# Patient Record
Sex: Female | Born: 1950 | Race: White | Hispanic: No | Smoking: Never smoker
Health system: Southern US, Community
[De-identification: ages and names within clinical notes are randomized; demographics above are authoritative.]

## PROBLEM LIST (undated history)

## (undated) DIAGNOSIS — G459 Transient cerebral ischemic attack, unspecified: Secondary | ICD-10-CM

## (undated) DIAGNOSIS — I10 Essential (primary) hypertension: Secondary | ICD-10-CM

## (undated) DIAGNOSIS — E7800 Pure hypercholesterolemia, unspecified: Secondary | ICD-10-CM

## (undated) DIAGNOSIS — E119 Type 2 diabetes mellitus without complications: Secondary | ICD-10-CM

## (undated) DIAGNOSIS — M810 Age-related osteoporosis without current pathological fracture: Secondary | ICD-10-CM

## (undated) DIAGNOSIS — I219 Acute myocardial infarction, unspecified: Secondary | ICD-10-CM

## (undated) DIAGNOSIS — F329 Major depressive disorder, single episode, unspecified: Secondary | ICD-10-CM

## (undated) DIAGNOSIS — E785 Hyperlipidemia, unspecified: Secondary | ICD-10-CM

## (undated) DIAGNOSIS — I679 Cerebrovascular disease, unspecified: Secondary | ICD-10-CM

## (undated) DIAGNOSIS — R4701 Aphasia: Secondary | ICD-10-CM

## (undated) DIAGNOSIS — F32A Depression, unspecified: Secondary | ICD-10-CM

## (undated) DIAGNOSIS — I2510 Atherosclerotic heart disease of native coronary artery without angina pectoris: Secondary | ICD-10-CM

## (undated) DIAGNOSIS — I639 Cerebral infarction, unspecified: Secondary | ICD-10-CM

## (undated) DIAGNOSIS — N390 Urinary tract infection, site not specified: Secondary | ICD-10-CM

## (undated) DIAGNOSIS — M1990 Unspecified osteoarthritis, unspecified site: Secondary | ICD-10-CM

## (undated) HISTORY — DX: Hyperlipidemia, unspecified: E78.5

## (undated) HISTORY — PX: ABDOMINAL HYSTERECTOMY: SHX81

## (undated) HISTORY — DX: Unspecified osteoarthritis, unspecified site: M19.90

## (undated) HISTORY — DX: Essential (primary) hypertension: I10

## (undated) HISTORY — DX: Depression, unspecified: F32.A

## (undated) HISTORY — PX: OTHER SURGICAL HISTORY: SHX169

## (undated) HISTORY — DX: Transient cerebral ischemic attack, unspecified: G45.9

## (undated) HISTORY — DX: Type 2 diabetes mellitus without complications: E11.9

## (undated) HISTORY — DX: Pure hypercholesterolemia, unspecified: E78.00

## (undated) HISTORY — DX: Acute myocardial infarction, unspecified: I21.9

## (undated) HISTORY — DX: Major depressive disorder, single episode, unspecified: F32.9

## (undated) HISTORY — DX: Cerebral infarction, unspecified: I63.9

## (undated) HISTORY — DX: Atherosclerotic heart disease of native coronary artery without angina pectoris: I25.10

## (undated) HISTORY — DX: Aphasia: R47.01

## (undated) HISTORY — DX: Urinary tract infection, site not specified: N39.0

## (undated) HISTORY — DX: Cerebrovascular disease, unspecified: I67.9

---

## 2007-01-04 ENCOUNTER — Inpatient Hospital Stay (HOSPITAL_COMMUNITY): Admission: EM | Admit: 2007-01-04 | Discharge: 2007-01-09 | Payer: Self-pay | Admitting: Emergency Medicine

## 2007-01-04 ENCOUNTER — Ambulatory Visit: Payer: Self-pay | Admitting: Cardiovascular Disease

## 2007-01-24 ENCOUNTER — Ambulatory Visit: Payer: Self-pay | Admitting: Cardiovascular Disease

## 2007-01-31 ENCOUNTER — Encounter (HOSPITAL_COMMUNITY): Admission: RE | Admit: 2007-01-31 | Discharge: 2007-05-01 | Payer: Self-pay | Admitting: Cardiovascular Disease

## 2007-02-21 ENCOUNTER — Ambulatory Visit: Payer: Self-pay | Admitting: Cardiovascular Disease

## 2007-04-11 ENCOUNTER — Ambulatory Visit: Payer: Self-pay | Admitting: Cardiovascular Disease

## 2007-04-12 ENCOUNTER — Ambulatory Visit: Payer: Self-pay

## 2007-05-02 ENCOUNTER — Encounter (HOSPITAL_COMMUNITY): Admission: RE | Admit: 2007-05-02 | Discharge: 2007-06-07 | Payer: Self-pay | Admitting: Cardiovascular Disease

## 2007-06-02 ENCOUNTER — Inpatient Hospital Stay (HOSPITAL_COMMUNITY): Admission: EM | Admit: 2007-06-02 | Discharge: 2007-06-04 | Payer: Self-pay | Admitting: Emergency Medicine

## 2007-06-02 ENCOUNTER — Ambulatory Visit: Payer: Self-pay | Admitting: Cardiovascular Disease

## 2007-06-03 ENCOUNTER — Ambulatory Visit: Payer: Self-pay | Admitting: Vascular Surgery

## 2007-06-03 ENCOUNTER — Encounter: Payer: Self-pay | Admitting: Cardiovascular Disease

## 2007-06-11 ENCOUNTER — Ambulatory Visit: Payer: Self-pay | Admitting: Internal Medicine

## 2007-06-19 ENCOUNTER — Ambulatory Visit (HOSPITAL_COMMUNITY): Admission: RE | Admit: 2007-06-19 | Discharge: 2007-06-19 | Payer: Self-pay | Admitting: Internal Medicine

## 2007-07-01 ENCOUNTER — Ambulatory Visit: Payer: Self-pay | Admitting: Vascular Surgery

## 2007-07-02 ENCOUNTER — Encounter: Admission: RE | Admit: 2007-07-02 | Discharge: 2007-09-30 | Payer: Self-pay | Admitting: Neurology

## 2007-07-08 ENCOUNTER — Ambulatory Visit: Payer: Self-pay | Admitting: Cardiovascular Disease

## 2007-09-13 ENCOUNTER — Ambulatory Visit: Payer: Self-pay | Admitting: Psychology

## 2007-09-30 ENCOUNTER — Encounter: Admission: RE | Admit: 2007-09-30 | Discharge: 2007-09-30 | Payer: Self-pay | Admitting: Psychology

## 2007-10-16 ENCOUNTER — Ambulatory Visit: Payer: Self-pay | Admitting: Cardiovascular Disease

## 2008-01-03 ENCOUNTER — Ambulatory Visit: Payer: Self-pay | Admitting: Vascular Surgery

## 2008-04-01 ENCOUNTER — Ambulatory Visit: Payer: Self-pay | Admitting: Cardiovascular Disease

## 2008-04-29 ENCOUNTER — Ambulatory Visit (HOSPITAL_COMMUNITY): Admission: RE | Admit: 2008-04-29 | Discharge: 2008-04-30 | Payer: Self-pay | Admitting: Surgery

## 2008-04-29 ENCOUNTER — Encounter (INDEPENDENT_AMBULATORY_CARE_PROVIDER_SITE_OTHER): Payer: Self-pay | Admitting: Surgery

## 2008-05-24 IMAGING — CR DG CHEST 2V
2 series · 2 of 2 positions shown · non-contrast
Comparison: 06/02/2007

CLINICAL DATA: Pre admit for hyperparathyroidism

CHEST - 2 VIEW

[view not recorded (1 of 2)]
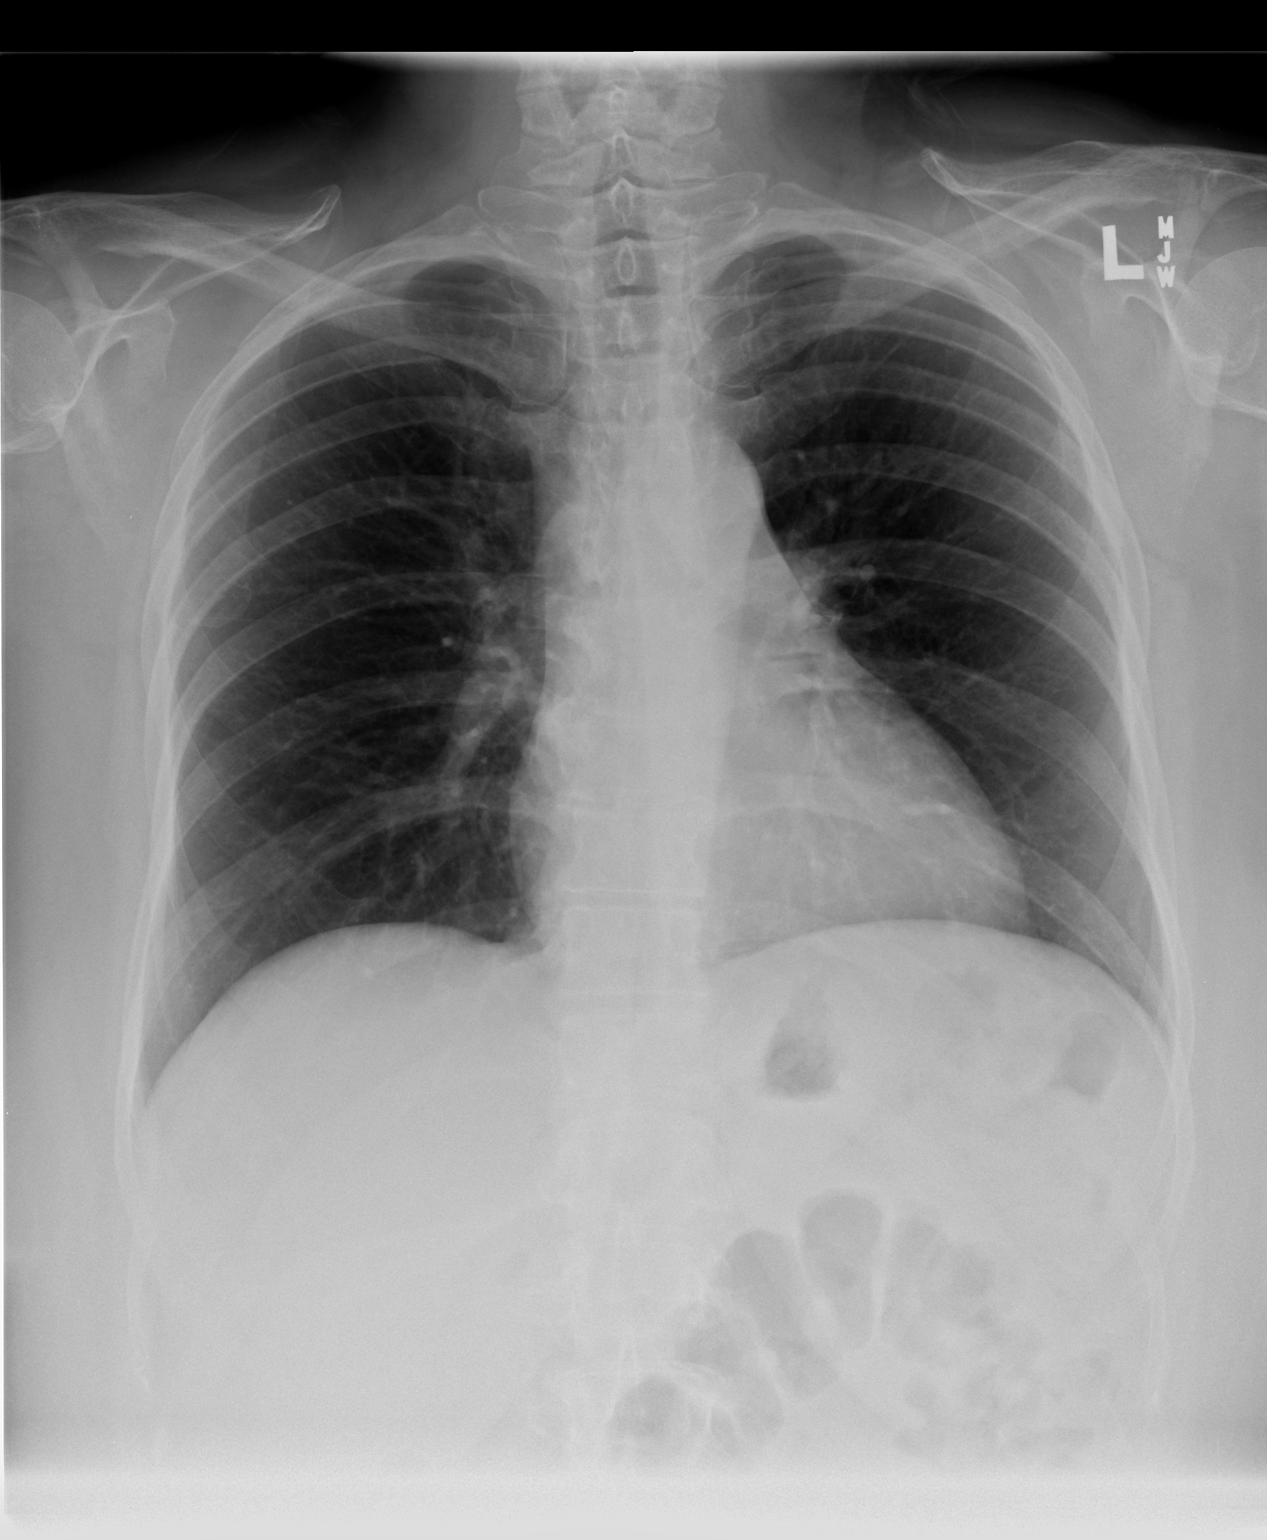

[view not recorded (2 of 2)]
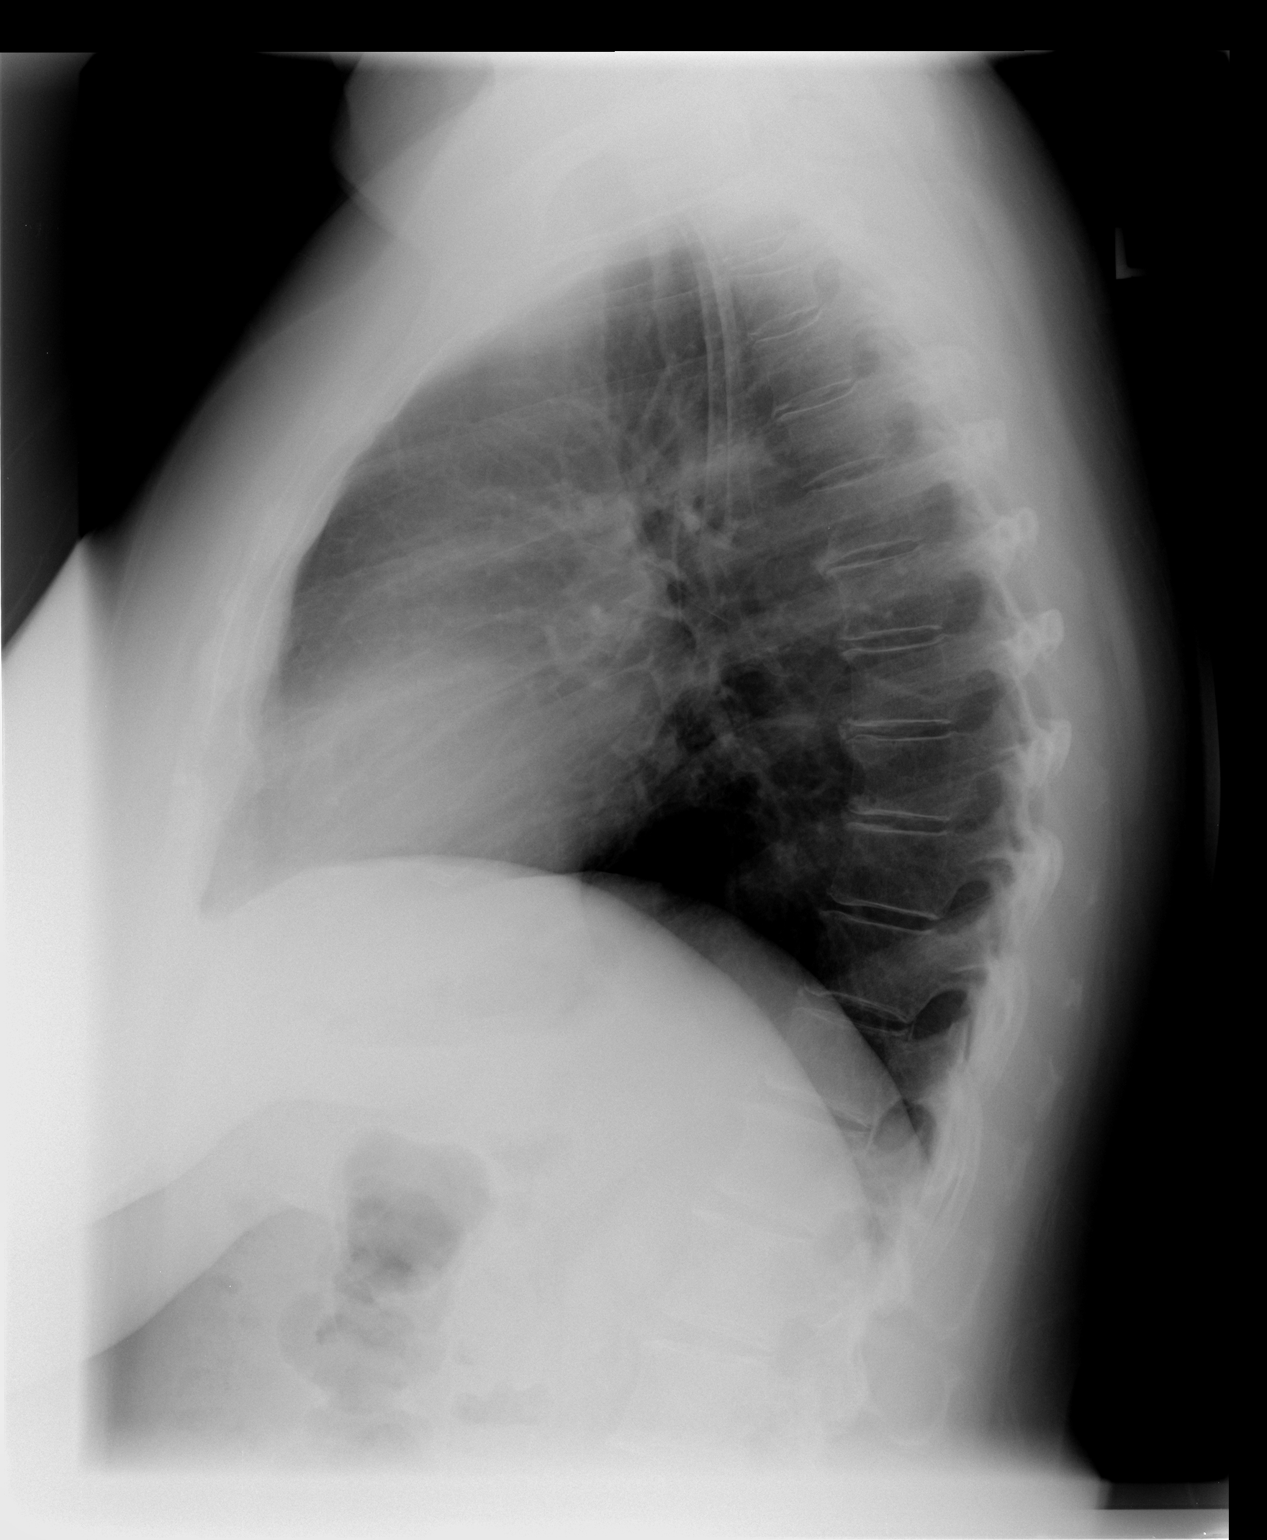

[2 of 2 positions shown; findings below may reference images not displayed]

FINDINGS: Heart and mediastinal contours normal.  Lungs clear.
Osseous structures intact.  No abnormality of the soft tissues.
IMPRESSION: No active disease

## 2008-09-25 ENCOUNTER — Ambulatory Visit: Payer: Self-pay | Admitting: Cardiovascular Disease

## 2009-03-30 DIAGNOSIS — N390 Urinary tract infection, site not specified: Secondary | ICD-10-CM

## 2009-03-30 DIAGNOSIS — I252 Old myocardial infarction: Secondary | ICD-10-CM | POA: Insufficient documentation

## 2009-03-30 DIAGNOSIS — Z862 Personal history of diseases of the blood and blood-forming organs and certain disorders involving the immune mechanism: Secondary | ICD-10-CM

## 2009-03-30 DIAGNOSIS — I219 Acute myocardial infarction, unspecified: Secondary | ICD-10-CM

## 2009-03-30 DIAGNOSIS — I2510 Atherosclerotic heart disease of native coronary artery without angina pectoris: Secondary | ICD-10-CM

## 2009-03-30 DIAGNOSIS — Z8639 Personal history of other endocrine, nutritional and metabolic disease: Secondary | ICD-10-CM

## 2009-03-30 DIAGNOSIS — I25119 Atherosclerotic heart disease of native coronary artery with unspecified angina pectoris: Secondary | ICD-10-CM | POA: Insufficient documentation

## 2009-03-30 DIAGNOSIS — M129 Arthropathy, unspecified: Secondary | ICD-10-CM | POA: Insufficient documentation

## 2009-03-30 DIAGNOSIS — E785 Hyperlipidemia, unspecified: Secondary | ICD-10-CM | POA: Insufficient documentation

## 2009-03-30 DIAGNOSIS — I6350 Cerebral infarction due to unspecified occlusion or stenosis of unspecified cerebral artery: Secondary | ICD-10-CM

## 2009-03-30 DIAGNOSIS — E7800 Pure hypercholesterolemia, unspecified: Secondary | ICD-10-CM

## 2009-03-30 DIAGNOSIS — E119 Type 2 diabetes mellitus without complications: Secondary | ICD-10-CM

## 2009-03-30 DIAGNOSIS — I10 Essential (primary) hypertension: Secondary | ICD-10-CM

## 2009-03-31 ENCOUNTER — Encounter: Payer: Self-pay | Admitting: Cardiovascular Disease

## 2009-03-31 ENCOUNTER — Ambulatory Visit: Payer: Self-pay | Admitting: Cardiovascular Disease

## 2009-04-09 ENCOUNTER — Ambulatory Visit: Payer: Self-pay

## 2009-06-22 ENCOUNTER — Telehealth: Payer: Self-pay | Admitting: Cardiovascular Disease

## 2009-10-12 ENCOUNTER — Encounter: Payer: Self-pay | Admitting: Cardiovascular Disease

## 2009-10-13 ENCOUNTER — Ambulatory Visit: Payer: Self-pay | Admitting: Cardiovascular Disease

## 2009-10-19 DIAGNOSIS — M810 Age-related osteoporosis without current pathological fracture: Secondary | ICD-10-CM | POA: Insufficient documentation

## 2009-10-19 DIAGNOSIS — I6529 Occlusion and stenosis of unspecified carotid artery: Secondary | ICD-10-CM | POA: Insufficient documentation

## 2009-10-19 DIAGNOSIS — E1139 Type 2 diabetes mellitus with other diabetic ophthalmic complication: Secondary | ICD-10-CM | POA: Insufficient documentation

## 2009-10-29 DIAGNOSIS — I69959 Hemiplegia and hemiparesis following unspecified cerebrovascular disease affecting unspecified side: Secondary | ICD-10-CM | POA: Insufficient documentation

## 2009-12-02 ENCOUNTER — Telehealth: Payer: Self-pay | Admitting: Cardiovascular Disease

## 2009-12-30 ENCOUNTER — Ambulatory Visit: Payer: Self-pay | Admitting: Vascular Surgery

## 2010-03-25 ENCOUNTER — Ambulatory Visit: Payer: Self-pay | Admitting: Cardiovascular Disease

## 2010-10-07 ENCOUNTER — Ambulatory Visit: Payer: Self-pay | Admitting: Cardiovascular Disease

## 2010-12-29 DIAGNOSIS — I252 Old myocardial infarction: Secondary | ICD-10-CM | POA: Insufficient documentation

## 2011-01-02 ENCOUNTER — Telehealth: Payer: Self-pay | Admitting: Cardiovascular Disease

## 2011-01-15 ENCOUNTER — Encounter: Payer: Self-pay | Admitting: Internal Medicine

## 2011-01-24 NOTE — Assessment & Plan Note (Signed)
Summary: per check /saf   CC:  no complaints.  History of Present Illness: Margaret Hobbs is seen today for F/U of elevated lipids, HTN and CAD she had her last cath in 05/2007 and had a patent stent to the RCA which was placed in January of 2008.  She has not had any SSCP.  Her BP has been under good controll.  She sees Dr. Clelia Croft for blood work in a couple of weeks.  She is on humera for psoriasis and would be considered immunocompromized.  She is to get a flue shot.  She needs a refill on her nitro. She is a bit depressed again this am.  She has gained some weight.  She has had a previous CVA with normal carotids and has a hard time with some LLE weakness.  Her grand-daughter has moved out and is lving on campus at [employer] nursing school.  Current Medications (verified): 1)  Plavix 75 Mg Tabs (Clopidogrel Bisulfate) .[name].. 1 Daily 2)  Crestor 10 Mg Tabs (Rosuvastatin Calcium) .[name].. 1 Daily 3)  Toprol Xl 100 Mg Xr24h-Tab (Metoprolol Succinate) .[name].. 1 Daily 4)  Hydralazine Hcl 50 Mg Tabs (Hydralazine Hcl) .[name].. 1 Tab By Mouth Two Times A Day 5)  Losartan Potassium-Hctz 100-25 Mg Tabs (Losartan Potassium-Hctz) .[name].. 1 Tab By Mouth Once Daily 6)  Glipizide-Metformin Hcl 5-500 Mg Tabs (Glipizide-Metformin Hcl) .[name].. 1 Tab By Mouth Two Times A Day 7)  Humira 40 Mg/0.12ml Kit (Adalimumab) .[name].. 1 Injection Every 2 Weeks 8)  Aspirin 81 Mg  Tabs (Aspirin) .[name].. 1 Tab By Mouth Once Daily 9)  Vitamin A .[name].. 1 Tab By Mouth Once Daily 10)  Calcium/ Vitamin D .... 1 Tab By Mouth Once Daily 11)  Vitamin D .... 1 Tab By Mouth Once Daily 12)  Folic Acid 1 Mg .[name].. 1 Tab By Mouth Once Daily 13)  Nitroglycerin 0.4 Mg Subl (Nitroglycerin) .[name].. 1 Tablet Under Tongue As Directed 14)  Vitamin B-12 1000 Mcg Tabs (Cyanocobalamin) .[name].. 1 Tab By Mouth Once Daily 15)  Vitamin C 500 Mg Tabs (Ascorbic Acid) .[name].. 1 Tab By Mouth Once Daily 16)  Vitamin E 400 Unit Chew (Vitamin E) .[name].. 1 Tab By Mouth Once Daily 17)  Co Q-10 30 Mg  Caps  (Coenzyme Q10) .[name].. 1 Tab By Mouth Once Daily  Allergies: No Known Drug Allergies  Past History:  Past Medical History: Last updated: 03/30/2009 Current Problems:  TIA (ICD-435.9) URINARY TRACT INFECTION (ICD-599.0) HYPERLIPIDEMIA (ICD-272.4) CEREBROVASCULAR DISEASE (ICD-437.9) APHASIA (ICD-784.3) DIABETES MELLITUS, TYPE II, MILD (ICD-250.00) CVA (ICD-434.91) HYPERPARATHYROIDISM, HX OF (ICD-V12.2) ARTHRITIS (ICD-716.90) HYPERTENSION (ICD-401.9) HYPERCHOLESTEROLEMIA (ICD-272.0) MYOCARDIAL INFARCTION (ICD-410.90) IMI 2008 CAD (ICD-414.00)  Past Surgical History: Last updated: 03/30/2009 hysterectomy  Mother alive with diabetes, hypertension.  Maternal   grandmother with diabetes.  Father deceased secondary to EtOH abuse.  No   siblings with known CAD.   Family History: Last updated: 03/30/2009  Mother alive with diabetes, hypertension.  Maternal   grandmother with diabetes.  Father deceased secondary to EtOH abuse.  No   siblings with known CAD.   Social History: Last updated: 03/31/2009 Widowed since 2009 Depressed Goes to [employer] work One son in [place]  Review of Systems       Denies fever, malais, weight loss, blurry vision, decreased visual acuity, cough, sputum, SOB, hemoptysis, pleuritic pain, palpitaitons, heartburn, abdominal pain, melena, lower extremity edema, claudication, or rash.   Vital Signs:  Patient profile:   60 year old female Height:  61 inches Weight:      177 pounds BMI:     33.56 Pulse rate:   68 / minute Resp:     14 per minute BP sitting:   120 / 70  (left arm)  Vitals Entered By: Kem Parkinson (March 25, 2010 8:57 AM)  Physical Exam  General:  Affect appropriate Healthy:  appears stated age HEENT: normal Neck supple with no adenopathy JVP normal no bruits no thyromegaly Lungs clear with no wheezing and good diaphragmatic motion Heart:  S1/S2 no murmur,rub, gallop or click PMI normal Abdomen:  benighn, BS positve, no tenderness, no AAA no bruit.  No HSM or HJR Distal pulses intact with no bruits No edema Neuro non-focal Skin warm and dry    Impression & Recommendations:  Problem # 1:  CAD (ICD-414.00)  No angina.  Continue medical Rx Her updated medication list for this problem includes:    Plavix 75 Mg Tabs (Clopidogrel bisulfate) .[name]... 1 daily    Toprol Xl 100 Mg Xr24h-tab (Metoprolol succinate) .[name]... 1 daily    Aspirin 81 Mg Tabs (Aspirin) .[name]... 1 tab by mouth once daily    Nitroglycerin 0.4 Mg Subl (Nitroglycerin) .[name]... 1 tablet under tongue as directed  Her updated medication list for this problem includes:    Plavix 75 Mg Tabs (Clopidogrel bisulfate) .[name]... 1 daily    Toprol Xl 100 Mg Xr24h-tab (Metoprolol succinate) .[name]... 1 daily    Aspirin 81 Mg Tabs (Aspirin) .[name]... 1 tab by mouth once daily    Nitroglycerin 0.4 Mg Subl (Nitroglycerin) .[name]... 1 tablet under tongue as directed  Problem # 2:  CVA (ICD-434.91) Continue antiplatlet Rx.  Has seen Dr Arbie Cookey in the past.  Carotids were normal in 2008 Her updated medication list for this problem includes:    Plavix 75 Mg Tabs (Clopidogrel bisulfate) .[name]... 1 daily    Aspirin 81 Mg Tabs (Aspirin) .[name]... 1 tab by mouth once daily  Problem # 3:  HYPERTENSION (ICD-401.9) Well  Her updated medication list for this problem includes:    Toprol Xl 100 Mg Xr24h-tab (Metoprolol succinate) .[name]... 1 daily    Hydralazine Hcl 50 Mg Tabs (Hydralazine hcl) .[name]... 1 tab by mouth two times a day    Losartan Potassium-hctz 100-25 Mg Tabs (Losartan potassium-hctz) .[name]... 1 tab by mouth once daily    Aspirin 81 Mg Tabs (Aspirin) .[name]... 1 tab by mouth once daily  Problem # 4:  HYPERLIPIDEMIA (ICD-272.4) Lipid and liver profile in 6 months Her updated medication list for this problem includes:    Crestor 10 Mg Tabs (Rosuvastatin calcium) .[name]... 1 daily  Patient Instructions: 1)  Your physician recommends that you schedule a follow-up  appointment in: 6 months   EKG Report  Procedure date:  03/25/2010  Findings:      NSR 68 Normal ECG

## 2011-01-24 NOTE — Assessment & Plan Note (Signed)
Summary: f51m/dm   History of Present Illness: Margaret Hobbs is seen today for F/U of elevated lipids, HTN and CAD she had her last cath in 05/2007 and had a patent stent to the RCA which was placed in January of 2008.  She has not had any SSCP.  Her BP has been under good controll.  She sees Dr. Clelia Croft for blood work in a couple of weeks.  She is on humera for psoriasis and would be considered immunocompromized.  She already got a flue shot.  She needs a refill on her hydralazine . She is a bit depressed again this am.  She has gained some weight.  She has had a previous CVA with normal carotids and has a hard time with some LLE weakness.  Her grand-daughter has moved out and is lving on campus at [employer] nursing school.  Current Problems (verified): 1)  Urinary Tract Infection  (ICD-599.0) 2)  Hyperlipidemia  (ICD-272.4) 3)  Diabetes Mellitus, Type II, Mild  (ICD-250.00) 4)  Cva  (ICD-434.91) 5)  Hyperparathyroidism, Hx of  (ICD-V12.2) 6)  Arthritis  (ICD-716.90) 7)  Hypertension  (ICD-401.9) 8)  Hypercholesterolemia  (ICD-272.0) 9)  Myocardial Infarction  (ICD-410.90) 10)  Cad  (ICD-414.00)  Current Medications (verified): 1)  Plavix 75 Mg Tabs (Clopidogrel Bisulfate) .[name].. 1 Daily 2)  Crestor 10 Mg Tabs (Rosuvastatin Calcium) .[name].. 1 Daily 3)  Toprol Xl 100 Mg Xr24h-Tab (Metoprolol Succinate) .[name].. 1 Daily 4)  Hydralazine Hcl 50 Mg Tabs (Hydralazine Hcl) .[name].. 1 Tab By Mouth Two Times A Day 5)  Losartan Potassium-Hctz 100-25 Mg Tabs (Losartan Potassium-Hctz) .[name].. 1 Tab By Mouth Once Daily 6)  Glipizide-Metformin Hcl 5-500 Mg Tabs (Glipizide-Metformin Hcl) .[name].. 1 Tab By Mouth Two Times A Day 7)  Humira 40 Mg/0.74ml Kit (Adalimumab) .[name].. 1 Injection Every 2 Weeks 8)  Aspirin 81 Mg  Tabs (Aspirin) .[name].. 1 Tab By Mouth Once Daily 9)  Vitamin A .[name].. 1 Tab By Mouth Once Daily 10)  Calcium/ Vitamin D .... 1 Tab By Mouth Once Daily 11)  Vitamin D .... 1 Tab By Mouth Once Daily 12)  Folic Acid 1 Mg .[name].. 1 Tab  By Mouth Once Daily 13)  Nitroglycerin 0.4 Mg Subl (Nitroglycerin) .[name].. 1 Tablet Under Tongue As Directed 14)  Vitamin B-12 1000 Mcg Tabs (Cyanocobalamin) .[name].. 1 Tab By Mouth Once Daily 15)  Vitamin C 500 Mg Tabs (Ascorbic Acid) .[name].. 1 Tab By Mouth Once Daily 16)  Vitamin E 400 Unit Chew (Vitamin E) .[name].. 1 Tab By Mouth Once Daily 17)  Co Q-10 30 Mg  Caps (Coenzyme Q10) .[name].. 1 Tab By Mouth Once Daily  Allergies (verified): No Known Drug Allergies  Past History:  Past Medical History: Last updated: 03/30/2009 Current Problems:  TIA (ICD-435.9) URINARY TRACT INFECTION (ICD-599.0) HYPERLIPIDEMIA (ICD-272.4) CEREBROVASCULAR DISEASE (ICD-437.9) APHASIA (ICD-784.3) DIABETES MELLITUS, TYPE II, MILD (ICD-250.00) CVA (ICD-434.91) HYPERPARATHYROIDISM, HX OF (ICD-V12.2) ARTHRITIS (ICD-716.90) HYPERTENSION (ICD-401.9) HYPERCHOLESTEROLEMIA (ICD-272.0) MYOCARDIAL INFARCTION (ICD-410.90) IMI 2008 CAD (ICD-414.00)  Past Surgical History: Last updated: 03/30/2009 hysterectomy  Mother alive with diabetes, hypertension.  Maternal   grandmother with diabetes.  Father deceased secondary to EtOH abuse.  No   siblings with known CAD.   Family History: Last updated: 03/30/2009  Mother alive with diabetes, hypertension.  Maternal   grandmother with diabetes.  Father deceased secondary to EtOH abuse.  No   siblings with known CAD.   Social History: Last updated: 03/31/2009 Widowed since 2009 Depressed Goes to [employer] work One  son in [place]  Review of Systems       Denies fever, malais, weight loss, blurry vision, decreased visual acuity, cough, sputum, SOB, hemoptysis, pleuritic pain, palpitaitons, heartburn, abdominal pain, melena, lower extremity edema, claudication, or rash.   Vital Signs:  Patient profile:   60 year old female Height:      61 inches Weight:      186 pounds BMI:     35.27 Pulse rate:   69 / minute Resp:     18 per minute BP sitting:   140 / 68   (left arm)  Vitals Entered By: Marrion Coy, CNA (October 07, 2010 8:53 AM)  Physical Exam  General:  Affect appropriate Healthy:  appears stated age HEENT: normal Neck supple with no adenopathy JVP normal no bruits no thyromegaly Lungs clear with no wheezing and good diaphragmatic motion Heart:  S1/S2 no murmur,rub, gallop or click PMI normal Abdomen: benighn, BS positve, no tenderness, no AAA no bruit.  No HSM or HJR Distal pulses intact with no bruits No edema Neuro non-focal Skin warm and dry    Impression & Recommendations:  Problem # 1:  HYPERLIPIDEMIA (ICD-272.4) Continue statin labs per Dr Clelia Croft Her updated medication list for this problem includes:    Crestor 10 Mg Tabs (Rosuvastatin calcium) .[name]... 1 daily  Problem # 2:  CVA (ICD-434.91) Stable with minimal residual effects.  Continue antiplatlets Her updated medication list for this problem includes:    Plavix 75 Mg Tabs (Clopidogrel bisulfate) .[name]... 1 daily    Aspirin 81 Mg Tabs (Aspirin) .[name]... 1 tab by mouth once daily  Problem # 3:  HYPERTENSION (ICD-401.9) Well controlled Her updated medication list for this problem includes:    Toprol Xl 100 Mg Xr24h-tab (Metoprolol succinate) .[name]... 1 daily    Hydralazine Hcl 50 Mg Tabs (Hydralazine hcl) .[name]... 1 tab by mouth two times a day    Losartan Potassium-hctz 100-25 Mg Tabs (Losartan potassium-hctz) .[name]... 1 tab by mouth once daily    Aspirin 81 Mg Tabs (Aspirin) .[name]... 1 tab by mouth once daily  Problem # 4:  MYOCARDIAL INFARCTION (ICD-410.90) CAD stable with no angina.  Continue antiplatelet and BB Her updated medication list for this problem includes:    Plavix 75 Mg Tabs (Clopidogrel bisulfate) .[name]... 1 daily    Toprol Xl 100 Mg Xr24h-tab (Metoprolol succinate) .[name]... 1 daily    Aspirin 81 Mg Tabs (Aspirin) .[name]... 1 tab by mouth once daily    Nitroglycerin 0.4 Mg Subl (Nitroglycerin) .[name]... 1 tablet under tongue as directed  Her updated medication list for this  problem includes:    Plavix 75 Mg Tabs (Clopidogrel bisulfate) .[name]... 1 daily    Toprol Xl 100 Mg Xr24h-tab (Metoprolol succinate) .[name]... 1 daily    Aspirin 81 Mg Tabs (Aspirin) .[name]... 1 tab by mouth once daily    Nitroglycerin 0.4 Mg Subl (Nitroglycerin) .[name]... 1 tablet under tongue as directed  Patient Instructions: 1)  Your physician recommends that you schedule a follow-up appointment in: 6 MONTHS WITH DR Eden Emms 2)  Your physician recommends that you continue on your current medications as directed. Please refer to the Current Medication list given to you today. Prescriptions: HYDRALAZINE HCL 50 MG TABS (HYDRALAZINE HCL) 1 tab by mouth two times a day  #180 x 3   Entered by:   Scherrie Bateman, LPN   Authorized by:   Colon Branch, MD, [facility]   Signed by:   Scherrie Bateman, LPN on 16/09/9603  Method used:   Faxed to ...       [facility] Environmental education officer)       [address] [address]       [place], [place]  [address]       Ph: [phone]       Fax: [phone]   RxID:   [id number]

## 2011-01-26 NOTE — Progress Notes (Signed)
Summary: refill  Phone Note Refill Request Message from:  Patient on January 02, 2011 4:16 PM  Refills Requested: Medication #1:  CRESTOR 10 MG TABS 1 daily  Medication #2:  LOSARTAN POTASSIUM-HCTZ 100-25 MG TABS 1 tab by mouth once daily [facility] [phone]  Initial call taken by: Judie Grieve,  January 02, 2011 4:18 PM    Prescriptions: LOSARTAN POTASSIUM-HCTZ 100-25 MG TABS (LOSARTAN POTASSIUM-HCTZ) 1 tab by mouth once daily  #90 x 3   Entered by:   Kem Parkinson   Authorized by:   Colon Branch, MD, [facility]   Signed by:   Kem Parkinson on 01/03/2011   Method used:   Faxed to ...       [facility] Environmental education officer)       [address] [address]       [place], [place]  [address]       Ph: [phone]       Fax: [phone]   RxID:   [id number] CRESTOR 10 MG TABS (ROSUVASTATIN CALCIUM) 1 daily  #90 x 3   Entered by:   Kem Parkinson   Authorized by:   Colon Branch, MD, [facility]   Signed by:   Kem Parkinson on 01/03/2011   Method used:   Faxed to ...       [facility] Environmental education officer)       [address] [address]       [place], [place]  [address]       Ph: [phone]       Fax: [phone]   RxID:   [id number]

## 2011-04-20 ENCOUNTER — Encounter: Payer: Self-pay | Admitting: Cardiovascular Disease

## 2011-04-21 ENCOUNTER — Other Ambulatory Visit: Payer: Self-pay | Admitting: *Deleted

## 2011-04-21 ENCOUNTER — Encounter: Payer: Self-pay | Admitting: Cardiovascular Disease

## 2011-04-21 ENCOUNTER — Ambulatory Visit (INDEPENDENT_AMBULATORY_CARE_PROVIDER_SITE_OTHER): Payer: Medicare Other | Admitting: Cardiovascular Disease

## 2011-04-21 DIAGNOSIS — I10 Essential (primary) hypertension: Secondary | ICD-10-CM

## 2011-04-21 DIAGNOSIS — I2510 Atherosclerotic heart disease of native coronary artery without angina pectoris: Secondary | ICD-10-CM

## 2011-04-21 DIAGNOSIS — E7800 Pure hypercholesterolemia, unspecified: Secondary | ICD-10-CM

## 2011-04-21 MED ORDER — METOPROLOL SUCCINATE ER 100 MG PO TB24: 100.0000 mg | ORAL_TABLET | Freq: Every day | ORAL | Status: DC

## 2011-04-21 MED ORDER — CLOPIDOGREL BISULFATE 75 MG PO TABS: 75.0000 mg | ORAL_TABLET | Freq: Every day | ORAL | Status: DC

## 2011-04-21 MED ORDER — NITROGLYCERIN 0.4 MG SL SUBL: 0.4000 mg | SUBLINGUAL_TABLET | SUBLINGUAL | Status: DC | PRN

## 2011-04-21 MED ORDER — LOSARTAN POTASSIUM-HCTZ 100-25 MG PO TABS: 1.0000 | ORAL_TABLET | Freq: Every day | ORAL | Status: DC

## 2011-04-21 MED ORDER — ROSUVASTATIN CALCIUM 10 MG PO TABS: 10.0000 mg | ORAL_TABLET | Freq: Every day | ORAL | Status: DC

## 2011-04-21 MED ORDER — HYDRALAZINE HCL 50 MG PO TABS: 50.0000 mg | ORAL_TABLET | Freq: Two times a day (BID) | ORAL | Status: DC

## 2011-04-21 NOTE — Assessment & Plan Note (Signed)
Stable with no angina and good activity level.  Continue medical Rx  

## 2011-04-21 NOTE — Assessment & Plan Note (Signed)
Cholesterol is at goal.  Continue current dose of statin and diet Rx.  No myalgias or side effects.  F/U  LFT's in 6 months. No results found for this basename: LDLCALC  Labs per Dr Clelia Croft

## 2011-04-21 NOTE — Assessment & Plan Note (Signed)
Well controlled.  Continue current medications and low sodium Dash type diet.    

## 2011-04-21 NOTE — Progress Notes (Signed)
Margaret Hobbs is seen today for F/U of elevated lipids, HTN and CAD she had her last cath in 05/2007 and had a patent stent to the RCA which was placed in January of 2008.  She has not had any SSCP.  Her BP has been under good controll.  She sees Dr. Clelia Croft for blood work in a couple of weeks.  She is on humera for psoriasis and would be considered immunocompromized.  She already got a flue shot.  She needs a refill on her hydralazine . She is a bit depressed again this am.  She has gained some weight.  She has had a previous CVA with normal carotids and has a hard time with some LLE weakness.  Her grand-daughter has moved out and is lving on campus at [employer] nursing school.  ROS: Denies fever, malais, weight loss, blurry vision, decreased visual acuity, cough, sputum, SOB, hemoptysis, pleuritic pain, palpitaitons, heartburn, abdominal pain, melena, lower extremity edema, claudication, or rash.   General: Affect appropriate Healthy:  appears stated age HEENT: normal Neck supple with no adenopathy JVP normal no bruits no thyromegaly Lungs clear with no wheezing and good diaphragmatic motion Heart:  S1/S2 no murmur,rub, gallop or click PMI normal Abdomen: benighn, BS positve, no tenderness, no AAA no bruit.  No HSM or HJR Distal pulses intact with no bruits No edema Neuro non-focal Skin warm and dry No muscular weakness   Current Outpatient Prescriptions  Medication Sig Dispense Refill  . adalimumab (HUMIRA) 40 MG/0.8ML injection Inject 40 mg into the skin every 14 (fourteen) days.        . Ascorbic Acid (VITAMIN C) 500 MG tablet Take 500 mg by mouth daily.        [name] aspirin 81 MG tablet Take 81 mg by mouth daily.        . calcium-vitamin D (OSCAL) 250-125 MG-UNIT per tablet Take 1 tablet by mouth daily.        . clopidogrel (PLAVIX) 75 MG tablet Take 75 mg by mouth daily.        [name] co-enzyme Q-10 30 MG capsule Take 30 mg by mouth daily.        . cyanocobalamin 1000 MCG tablet 1 tab po qd       .  folic acid (FOLVITE) 1 MG tablet Take 1 mg by mouth daily.        [name] glipiZIDE-metformin (METAGLIP) 5-500 MG per tablet Take 1 tablet by mouth 2 (two) times daily before a meal.        . hydrALAZINE (APRESOLINE) 50 MG tablet Take 50 mg by mouth 2 (two) times daily.        [name] losartan-hydrochlorothiazide (HYZAAR) 100-25 MG per tablet Take 1 tablet by mouth daily.        . metoprolol (TOPROL-XL) 100 MG 24 hr tablet Take 100 mg by mouth daily.        . nitroGLYCERIN (NITROSTAT) 0.4 MG SL tablet Place 0.4 mg under the tongue every 5 (five) minutes as needed.        . Risedronate Sodium (ATELVIA PO) 35 mg 1 tab per week       . rosuvastatin (CRESTOR) 10 MG tablet Take 10 mg by mouth daily.        . vitamin A 09811 UNIT capsule Take 10,000 Units by mouth daily.        [name] VITAMIN D, CHOLECALCIFEROL, PO 1 tab po qd      . vitamin E 400 UNIT capsule Take  400 Units by mouth daily.        [name] DISCONTD: vitamin B-12 (CYANOCOBALAMIN) 1000 MCG tablet Take 1,000 mcg by mouth daily.          Allergies  Review of patient's allergies indicates not on file.  Electrocardiogram:   NSR 77 occ PVC Poor R wave progression  Assessment and Plan

## 2011-04-21 NOTE — Patient Instructions (Signed)
Your physician recommends that you schedule a follow-up appointment in: 6 months with Dr. Nishan 

## 2011-05-09 NOTE — Discharge Summary (Signed)
Margaret Hobbs, Margaret Hobbs NO.:  [id number]   MEDICAL RECORD NO.:  [id number]          PATIENT TYPE:  INP   LOCATION:  3712                         FACILITY:  [facility]   PHYSICIAN:  [name], MD, FACCDATE OF BIRTH:  [birth year]   DATE OF ADMISSION:  06/02/2007  DATE OF DISCHARGE:  06/04/2007                               DISCHARGE SUMMARY   PROCEDURES:  1. Cardiac catheterization.  2. Coronary arteriogram.  3. Left ventriculogram.  4. StarClose to the right femoral artery.  5. Angiogram of the head and neck with contrast.   PRIMARY DIAGNOSIS:  Chest pain, medical therapy for coronary artery  disease recommended.   SECONDARY DIAGNOSES:  1. Diabetes.  2. Dyslipidemia.  3. History of diastolic dysfunction  4. Status post ST-elevation myocardial infarction in January 2008 with      percutaneous transluminal cardiac angioplasty and stent to the      right posterior descending artery.  5. Hypertension.  6. Rheumatoid arthritis.  7. Gastroesophageal reflux disease.  8. History of urinary tract infections.  9. Remote history of hysterectomy.  10.Possible transient ischemic attack (CT angiogram of the head and      neck pending).   Time of discharge:  39 minutes.   HOSPITAL COURSE:  Margaret Hobbs is a 60 year old female with known  coronary artery disease.  She was at cardiac rehab and was noted to have  slightly decreased level of consciousness.  Her CBG and oxygen levels  were okay.  She said she could not focus.  Her symptoms resolved, and  she had no further problems.  Later that day, she developed chest pain  that was initially intermittent but became constant.  It was relieved in  the emergency room with nitroglycerin.  She was admitted for further  evaluation.   She was noted to have a carotid bruit, and, because of her episode of  decreased level of consciousness, carotid Dopplers were performed.  She  had of 40-60% right ICA stenosis and a  60-80% left ICA stenosis.  There  was antegrade flow in both vertebral arteries.   Her cardiac enzymes were negative for MI, and a cardiac catheterization  was performed on June 03, 2007. The cardiac catheterization showed  improved left ventricular diastolic filling pressures, which were now  normalized, and a patent stent in the RCA-PDA.  She had diffuse coronary  artery disease including 80% OM, a 95% acute marginal, and a 70%  diagonal that was stable from a prior catheterization.  Medical therapy  was recommended for this.  Her EF was 60% with no wall motion  abnormalities.   On June 04, 2007, Margaret Hobbs was evaluated by Dr. Eden Emms.  Because of claustrophobia, she cannot tolerate the MRI, and, therefore,  CT angiogram of her head and neck was done to further evaluate the  carotid stenosis.  If those results do not require further inpatient  stay, she is tentatively considered stable for discharge on June 04, 2007, with outpatient followup arranged.   DISCHARGE INSTRUCTIONS:  1. Her activity level is to be increased gradually.  2. She is not to drive for 2  days and not to lift for a week.  3. She is to call our office for any problems with the catheterization      site.  4. She has appointment with Dr. Fabio Bering PA on June 23 at 3:30.  5. She is to follow up with Dr. Clelia Croft as needed.   DISCHARGE MEDICATIONS:  1. Metaglip 5/500 is to be restarted on June 07, 2007.  2. Methotrexate 2.5 mg tablets weekly.  3. Plavix 75 mg daily indefinitely.  4. Crestor 10 mg daily.  5. Hyzaar 100/25 mg daily.  6. Hydralazine 50 mg 3 times a day.  7. Glipizide 5 mg b.i.d. while off the Metaglip.  8. Nitroglycerin sublingual p.r.n.  9. Toprol XL 50 mg a day.      Theodore Demark, PA-C      [name], MD, [facility]  Electronically Signed    RB/MEDQ  D:  06/04/2007  T:  06/04/2007  Job:  [id number]   cc:   Martha Clan, MD, [facility]

## 2011-05-09 NOTE — Assessment & Plan Note (Signed)
[facility] HEALTHCARE                            CARDIOLOGY OFFICE NOTE   HADLEE, BURBACK                 MRN:          [id number]  DATE:10/16/2007                            DOB:          11/21/1951    [name] returns today for follow up. She has a history of coronary  artery disease with diabetes and hypertension. She has had stenting of  the right coronary artery and has some residual left sided disease.   She is not having any significant chest pain. Her last Myoview on  04/12/2007 was low risk and we felt that continued medical therapy was  warranted. She has good LV function.   Unfortunately, she suffered a recent CVA and was seen by Dr. Orlin Hilding.  She has a history of a 60% to 80% left carotid stenosis which has been  followed by Dr. Arbie Cookey. The velocities tend to place the stenosis in the  low end of her range and she has not been thought to need surgery. This  is being followed closely by Dr. Arbie Cookey. I believe that her last  ultrasound was done in April. She has continued on aspirin and Plavix  for this. She thinks that her sugars have been under reasonable control.  I did review blood work from Dr. Alver Fisher office and her LDL cholesterol  was 70. Her hemoglobin a1C was 5.3.   CURRENT MEDICATIONS:  1. Plavix 75 daily.  2. Glipizide/Metformin 5/500 1 tablet b.i.d.  3. Methotrexate.  4. Toprol 100 daily.  5. Humira 40 mg injection once every 3 weeks.  6. Hydralazine 50 t.i.d.  7. Hyzaar 100/25.  8. Aspirin daily.   PHYSICAL EXAMINATION:  GENERAL:  Remarkable for a somewhat depressed  white female in no distress. She has a little bit of difficulty with her  speech since her CVA, but there is no other focal neurological defects.  VITAL SIGNS:  Weight 169, blood pressure 128/78, pulse 84 and regular,  afebrile, respiratory rate 14.  HEENT:  Normal.  NECK:  Supple. There are no bruits. No lymphadenopathy. No thyromegaly.  No JVP elevation.  I listened closely particularly on the left side and  clearly there was no bruit.  LUNGS:  Clear with good diaphragmatic motion and no wheezing.  CARDIAC:  S1, S2 with distant heart sounds. PMI is not palpable.  ABDOMEN:  Bowel sounds positive. No tenderness. No AAA. No  hepatosplenomegaly or hepato jugular reflux.  EXTREMITIES:  Distal pulses intact. No edema.  SKIN:  Warm and dry.  MUSCULOSKELETAL:  There is no muscular weakness.   IMPRESSION:  1. Coronary artery disease with previous stenting of the right      coronary artery, continue aspirin and Plavix. Follow up Myoview in      a year.  2. History of CVA to be re-evaluated by Dr. Orlin Hilding and Dr. Arbie Cookey. He      may end up doing an elective left CEA for her ulcerated plaque. My      understanding is that he wanted to put off surgery since she did      have a recent CVA. Again, she will  continue antiplatelet therapy as      indicated.  3. Hypertension, currently well controlled. Continue current dosages      of hydralazine and Hyzaar.  4. Diabetes, well controlled. Continue glipizide/metformin. Hemoglobin      A1C less than 6. Continue diet modification.  5. Hypercholesterolemia in the setting of coronary disease. I will      have to look at her medication list. As far as I can tell, she does      not appear to be on a statin drug. Since she has diabetes and      coronary disease, I would probably recommend this even if her LDL      cholesterol is only 70.   Overall, I think that Moon is doing better. She still seems somewhat  depressed about the rehab needed for her CVA. She has a pleasant trip  planned to [place] and [place] for a couple of weddings and  hopefully this will make her smile a little bit more.     [name], MD, [facility]  Electronically Signed    PCN/MedQ  DD: 10/16/2007  DT: 10/17/2007  Job #: [id number]

## 2011-05-09 NOTE — Assessment & Plan Note (Signed)
[facility] HEALTHCARE                            CARDIOLOGY OFFICE NOTE   Margaret Hobbs, Margaret Hobbs                 MRN:          [id number]  DATE:06/11/2007                            DOB:          25-Nov-1951    PRIMARY CARDIOLOGIST:  Dr. Charlton Haws.   PRIMARY CARE PHYSICIAN:  Dr. Clelia Croft.   RHEUMATOLOGIST:  DR. Corliss Skains.   PATIENT PROFILE:  A 60 year old, Caucasian female with prior history of  CAD who presents for hospital followup.   PROBLEM LIST:  1. CAD.      a.     January 06, 2007, PCI and stenting of the PDA with placement       of a 2.5 x 24 mm express bare metal stent.      b.     April 12, 2007, exercise Myoview, exercise time 6 minutes       and 30 seconds. Maximum heart rate 121 beats per minute achieving       7.5 mets. EF was 75% with perfusion findings consistent with scar       and mild periinfarct ischemia. She was medically managed at that       time.      c.     June 03, 2007, cardiac catheterization revealing patent stent       in the RCA and PDA. She had diffuse coronary disease including an       80% in the OM, a 95% in the acute marginal, 97% stenosis in the       diagonal which was stable from prior catheterization. EF was 60%       without regional wall motion abnormalities.  2. Right lower extremity weakness.  3. Intermittent expressive aphasia.  4. Cerebrovascular disease.      a.     June 04, 2007, CT angio of the head and neck revealing no       significant carotid stenosis with small vetebral arteries       bilaterally with disease in the distal right vertebral artery but       both vertebral arteries were patent to the basilar. There was mild       stenosis in the left A1 segment as well as the anterior branch of       the right middle cerebral artery.  5. Hypertension.  6. Hyperlipidemia.  7. Type 2 diabetes mellitus.  8. Rheumatoid arthritis.  9. GERD.  10.History of UTI.  11.Remote history of hysterectomy.  12.Questionable TIA.   HISTORY OF PRESENT ILLNESS:  A 60 year old Caucasian female with the  above problem list who was admitted to [facility] from June 8 to June 10  after developing a decreased level of consciousness, expressive aphasia  and right lower extremity weakness while at cardiac rehab. She also had  some chest pain which is why she was admitted and underwent left heart  cardiac catheterization on June 9 revealing patent stents and otherwise  diffuse medically manageable disease. She did have some neurologic  symptoms. Initially an MRI was ordered; however, she could not tolerate  this secondary to claustrophobia and a CT  angio of the head and neck was  performed revealing no significant stenoses as outlined in the problem  list. She was discharged home on the 10th. Since her discharge, she has  not had any recurrent chest pain or shortness of breath; however, she  has continued to have intermittent expressive aphasia and slurring of  her speech and also right lower extremity weakness which has limited her  ability to ambulate. Her husband brought her in today with a wheelchair.  She denies any numbness or paresthesias and has been eating well. She  denies any dysphagia, vision or hearing changes. She denies any changes  in her cognitive abilities. She notes that she has had similar weakness  in her right foot when off of her rheumatologic medications for a  prolonged period of time although she has been taking these regularly.  She took her Humira injection this past Saturday and was expecting to  see some improvement in this weakness, however, she has not. She has not  seen primary care nor has she seen neurology despite her symptoms.   HOME MEDICATIONS:  1. Aspirin 81 mg daily.  2. Plavix 75 mg daily.  3. Glipizide/metformin 5/500 b.i.d.  4. Methotrexate 2.5 mg 6 tablets every other Tuesday.  5. Toprol XL 100 mg daily.  6. Humira 40 mg injection once q.3 weeks.  7.  Hydralazine 50 mg t.i.d.  8. Hyzaar HCT 100/25 mg daily.   PHYSICAL EXAMINATION:  VITAL SIGNS:  Blood pressure is 146/88, heart  rate 70, respirations 16.  GENERAL:  Pleasant, white female in no acute distress. Awake, alert and  oriented x3.  NECK:  With a soft, left carotid bruit. There is no JVD.  LUNGS:  Respirations regular and labored. Clear to auscultation.  CARDIAC:  Regular S1, S2. Normal S3, S4, murmurs.  ABDOMEN:  Obese, soft, nontender, nondistended. Bowel sounds present x4.  EXTREMITIES:  Warm, dry, pink. No clubbing, cyanosis or edema. Dorsalis  pedis and posterior tibial pulses 2+ and equal bilaterally. The right  groin site is without bleeding, bruising or hematoma.  NEUROLOGIC:  Cranial nerves II-XII are grossly intact. There is no  evidence of facial droop. Bilateral upper extremity strength is 5/5.  Left lower extremity is 5/5 and right lower extremity is at best 3/5.  She has equal sensation and proprioception throughout.   ACCESSORY CLINICAL FINDINGS:  None.   ASSESSMENT/PLAN:  1. Right lower extremity weakness with slurring of speech and      intermittent aphasia. The patient has been evaluated with a CT      angio of the head and neck while hospitalized which did not show      any acute problems or significant stenoses. She has not seen      neurology and will take the liberty of referring her to neurology      for further evaluation. I have discussed this with Dr. Jens Som and      he was in agreement with that plan.  2. Coronary artery disease. She has not had any chest pain or      shortness of breath. She has stable coronary disease from her last      catheterization. She remains on aspirin, Plavix, beta blocker and      ARB. She is apparently intolerant of statins.  3. Hyperlipidemia. As above intolerant to statins and ARB.  4. Type 2 diabetes mellitus followed by primary care. She remains on      Glipizide and metformin.  5. Rheumatoid arthritis  followed by her rheumatologist. We have      recommended followup as her current weakness symptoms are similar      to what she has experienced in the past with flares of rheumatoid      arthritis.  6. Disposition. Will try and arrange for neurology evaluation as soon      as possible. We recommended she followup with primary care and      rheumatology as well. Will see her back in 3 months or sooner if      necessary.      Nicolasa Ducking, ANP  Electronically Signed      Madolyn Frieze. Jens Som, MD, [facility]  Electronically Signed   CB/MedQ  DD: 06/11/2007  DT: 06/11/2007  Job #: [id number]

## 2011-05-09 NOTE — Assessment & Plan Note (Signed)
OFFICE VISIT   Margaret Hobbs, Margaret Hobbs  DOB:  14-Jul-1951                                       01/03/2008  ZOXWR#:[id number]   The patient had a lab-only study today.  I did review this with her.  She had mild carotid disease bilaterally and I would recommend for  vascular lab followup.  She does not need to see me at that time and  would repeat 2-year followups to rule out progression.   Larina Earthly, M.D.  Electronically Signed   TFE/MEDQ  D:  01/03/2008  T:  01/06/2008  Job:  [id number]

## 2011-05-09 NOTE — Consult Note (Signed)
NEW PATIENT CONSULTATION   Margaret Hobbs, Margaret Hobbs  DOB:  [birth year]                                       07/01/2007  NFAOZ#:[id number]   HISTORY:  Margaret Hobbs was in today for evaluation of  extracerebral vascular occlusive disease.  She is a 60 year old white  female who apparently had neurologic deficit around the time of a recent  cardiac catheterization.  She was admitted to [facility] June 02, 2007-June 04, 2007.  She had had a prior myocardial infarction with  PTCA and stenting in January of 2008.  This was a follow-up study after  she had had recurrent chest pain.  She reportedly had neurologic deficit  immediately following the cardiac catheterization.  I am seeing her fur  further evaluation.  She did undergo carotid duplex at the time of that  admission and I have this for review.  The velocities support moderate  level of stenoses bilaterally in the right and left carotid system.  She  also underwent CTA angiogram of her neck during that admission and I  have this for review and have discussed this with the patient and her  family present as well.  She denied any prior neurologic deficits.   PAST MEDICAL HISTORY:  1. Significant for diabetes.  She is non-insulin dependent.  2. She has elevated cholesterol.  3. Elevated blood pressure.  4. Prior myocardial infarction as stated.   HABITS:  She does not smoke or drink alcohol.   MEDICATIONS:  Plavix and aspirin.   PHYSICAL EXAMINATION:  GENERAL:  She is a well-developed, well-nourished  white female, previously stated age 52.  She does have continuing  ongoing deficit in her right hand, arm, and right leg.  NECK:  Her carotid arteries are without bruits bilaterally.  EXTREMITIES:  She has 2+ radial pulses.   I reviewed her CT scan with her.  This shows no evidence of significant  carotid stenoses bilaterally.  The angio of the head showed mild  stenosis in the intracranial  vessels.  There was no aneurysm present.  I  discussed this at length with Ms. Heider and her family present.  I have recommended that we repeat her duplex in six months to rule out  any progression.  We currently do not see any evidence of carotid  disease causing the left brain neurologic deficit that she has present.  She will continue to follow up with physical therapy and see me again in  six months.   Larina Earthly, M.D.  Electronically Signed   TFE/MEDQ  D:  07/01/2007  T:  07/01/2007  Job:  [id number]   cc:   Martha Clan, M.D.  [name], MD, [facility]  Catherine A. Orlin Hilding, M.D.

## 2011-05-09 NOTE — Assessment & Plan Note (Signed)
[facility] HEALTHCARE                            CARDIOLOGY OFFICE NOTE   Margaret, Hobbs                 MRN:          [id number]  DATE:09/25/2008                            DOB:          May 02, 1951    Margaret Hobbs returns today for followup.  She continues to be very depressed.  She lost her husband about a year ago and has really not recovered from  this.  Every time I see her in obvious that she continues to have major  depression.  She has only one son who lives in [place].  He is  trying to raise 4 kids on a waiter's job at [employer]	[place] and does not really  get to see his mother very often   In regards to her coronary artery disease, she has had ST elevation  myocardial infarction in January 2008.  She has had angioplasty and  stenting of the distal right coronary artery and the right PDA.  She has  diffuse but noncritical left-sided disease.  She is not having any  significant chest pain.  Her last Myoview study done on April 12, 2007,  was low-risk with small inferolateral wall infarct at mid and basal  level with mild peri-infarct ischemia.   Her LV function is preserved.  She had been compliant with her meds.  She is not having active angina.   CURRENT MEDICATIONS:  She is currently taking:  1. Plavix 75 a day.  2. Toprol 100 a day.  3. Hyzaar  100/25.  4. Crestor 10 a day.  5. Nitro as needed.  6. hydralazine 50 t.i.d.   PHYSICAL EXAMINATION:  GENERAL:  Her exam is remarkable for a depressed  white female who is prone to crying.  VITAL SIGNS:  Her weight is 171 which is stable.  Blood pressure is  110/67, pulse 78 and regular, respiratory rate 14, afebrile.  HEENT:  Unremarkable.  NECK:  Carotids are normal without bruit.  No lymphadenopathy,  thyromegaly, or JVP elevation.  LUNGS:  Clear with good diaphragmatic motion.  No wheezing.  HEART:  S1 and S2 with normal heart sounds.  PMI normal.  ABDOMEN:  Benign.  Bowel sounds positive.   No AAA.  No tenderness.  No  bruit.  No hepatosplenomegaly or hepatojugular reflux.  No tenderness.  EXTREMITIES:  Distal pulses are intact.  No edema.  NEURO:  Nonfocal.  SKIN:  Warm and dry.  MUSCULOSKELETAL:  No muscular weakness.   Electrocardiogram shows sinus rhythm with poor R-wave progression, left  axis deviation and an old inferior MI.   IMPRESSION:  1. Coronary artery disease.  Stent to the distal right and posterior      descending artery, currently stable.  Continue aspirin and Plavix.      Consider followup Myoview in a year.  2. Hypercholesteremia in the setting of coronary artery disease.      Continue Crestor.  Lipid and liver profile in 6 months.  3. Hypertension, currently well controlled.  Continue Hyzaar,      hydralazine and low-sodium diet.  4. Diabetes.  Follow up primary care MD.  Continue glipizide and  metformin 5/500.  5. Previous history of arthritis.  Continue methotrexate and Humira.   I think her heart is currently stable.  I will see her back in 6 months.     Margaret Hobbs. Margaret Emms, MD, [facility]  Electronically Signed    PCN/MedQ  DD: 09/25/2008  DT: 09/25/2008  Job #: [id number]

## 2011-05-09 NOTE — H&P (Signed)
[name], [name] NO.:  [id number]   MEDICAL RECORD NO.:  [id number]          PATIENT TYPE:  INP   LOCATION:  3712                         FACILITY:  [facility]   PHYSICIAN:  [name], MD, FACCDATE OF BIRTH:  [birth year]   DATE OF ADMISSION:  06/02/2007  DATE OF DISCHARGE:                              HISTORY & PHYSICAL   PRIMARY CARDIOLOGIST:  Theron Arista C. Eden Emms, MD, [facility]   PRIMARY CARE PHYSICIAN:  Dr.  Clelia Croft.   Margaret Hobbs is a patient well known to Dr. Eden Emms with known  coronary artery disease. Most recent cardiac workup in January of this  year. She had an acute MI, underwent cardiac catheterization with a bare  metal stent to the PDA. She has known three-vessel disease, also some  diastolic dysfunction with an EF of 55% by catheterization.  Ms. Margaret Rabon-  Jean Hobbs has a somewhat sedentary lifestyle secondary to her generalized  arthritis requiring methotrexate; however, she has been able to cardiac  rehab and just completed that this past Friday.  Interestingly  enough,while she was at rehab on Friday, had just completed her  exercises, had gotten off the stair climber, I believe she told me, and  had gone to report in exactly the rate and how fast she has walked, when  she got to the nurse, she could not verbally express the number that she  had walked. She knew in her mind what she had walked, but she could not  get to word out.  Apparently her CBG was checked which was okay per  patient's report.  Her oxygen level was okay.  Neurologically she  checked out okay.  The patient states she does have trouble focusing.  She went home, took a nap, felt fine, had no further problems.  Friday  night, though, she began to have some chest discomfort.  She described  it as a very localized ache. It felt very distant. It was a dull jabbing  sensation around her left breast. She initially states it was rather  steady but could not give me a length of time.  It is  very difficult to  obtain information from her. She is a poor historian.  She states then  it became intermittent, but then this morning it was steady and tense  sensation again. Apparently received nitroglycerin and aspirin here that  relieved her discomfort.  She states she is pain free now.  She denies  any associated symptoms with her chest discomfort.  No shortness of  breath, nausea, vomiting, diaphoresis.  It is not associated with any  position, activity or change.  Denies any belching, GERD.  No dizziness.  States her CBGs have run 80-100 at home.  She has not experienced any  other discomfort like this recently since January and has been doing  okay in rehab without any type of discomfort.   ALLERGIES:  Include SULFA and REMICADE.   MEDICATIONS AT HOME:  1. Plavix 75 mg daily.  2. Aspirin 81.  3. Toprol XL 50.  4. Hyzaar 100/25 daily.  5. Crestor 10.  6. Humira 40 mg injection every other week.  7.  Methotrexate 2.5 mg: The patient takes 6 tablets on Tuesdays only.  8. Glipizide/metformin 5/500. The patient takes 2 tablets p.o. b.i.d.  9. Hydralazine 50 mg p.o. 3 times a day.   PAST MEDICAL HISTORY:  1. Coronary artery disease status post MI in January with placement of      a bare metal stent to the PDA with known three-vessel disease.      diastolic dysfunction with EF 55.  2. Dyslipidemia.  3. Diabetes.  4. Arthritis requiring methotrexate.  5. Hypertension.  6. Hysterectomy.   SOCIAL HISTORY:  She lives in [place] with her husband.  She has one  adult child.  Denies any tobacco, EtOH or herbal medication use.  Exercise:  She has been doing cardiac rehab. Tries to comply to heart  healthy ADA diet.   FAMILY HISTORY:  Mother alive with diabetes, hypertension.  Maternal  grandmother with diabetes.  Father deceased secondary to EtOH abuse.  No  siblings with known CAD.   REVIEW OF SYSTEMS:  Positive for chest pain, myalgia, arthralgia, joint  swelling and pain  in joints.   PHYSICAL EXAMINATION:  VITAL SIGNS:  Temperature 97.9, pulse 68,  respirations 16, blood pressure 158/77, saturation 99% room air.  GENERAL: She is in no acute distress. She has a somewhat flat affect.  HEENT:  Normocephalic, atraumatic.  Pupils equal, round, react to light.  Sclerae is clear.  Mucous membrane moist.  NECK: Supple, left bruits.  No JVD.  CARDIOVASCULAR:  Exam reveals an S1 and S2.  LUNGS: Clear to auscultation.  SKIN:  Warm and dry.  ABDOMEN:  Soft, nontender, positive bowel sounds.  EXTREMITIES:  Lower Extremities without clubbing, cyanosis or edema.  NEUROLOGIC:  She is alert and oriented x3.  Speech is clear.  Responds  appropriately.   Chest x-ray is pending.   EKG:  Sinus rhythm at a rate of 70, no acute ST or T-wave changes.   Lab work:  Baseline hemoglobin 11.6, hematocrit  34.  Sodium 138,  potassium 3.4. Potassium has been addressed by ER physician.  Chloride  105, BUN 20, creatinine 0.9, glucose 164.  First set of cardiac enzymes,  point-of-care markers:  Troponin less than 0.05.   IMPRESSION:  1. Chest pain with known history of coronary artery disease.  2. Diabetes.  3. Hypertension.  4. Dyslipidemia.   Dr. Maurine Cane in to examine and assess patient with known disease.  Will need further evaluation.  Will arrange for cardiac catheterization  in the morning hopefully and also carotid Dopplers. Hopefully carotids  can be done prior to catheterization .      Dorian Pod, ACNP      [name], MD, [facility]  Electronically Signed    MB/MEDQ  D:  06/02/2007  T:  06/02/2007  Job:  [id number]

## 2011-05-09 NOTE — Procedures (Signed)
CAROTID DUPLEX EXAM   INDICATION:  Followup evaluation of known carotid artery disease.   HISTORY:  Diabetes:  No.  Cardiac:  MI in 2007, PTCA and stent January of 2008.  Hypertension:  Yes.  Smoking:  No.  Previous Surgery:  No.  CV History:  Previous study performed on June 03, 2007, at [facility]  revealed a 40-60% right ICA stenosis and a 60-80% left ICA stenosis.  Amaurosis Fugax No, Paresthesias No, Hemiparesis No                                       RIGHT             LEFT  Brachial systolic pressure:         136               140  Brachial Doppler waveforms:         Triphasic         Triphasic  Vertebral direction of flow:        Inaudible         Antegrade  DUPLEX VELOCITIES (cm/sec)  CCA peak systolic                   106               112  ECA peak systolic                   171               164  ICA peak systolic                   115               99  ICA end diastolic                   30                41  PLAQUE MORPHOLOGY:                  Soft              Soft  PLAQUE AMOUNT:                      Minimal           Minimal  PLAQUE LOCATION:                    Proximal CCA      Proximal ICA   IMPRESSION:  1. 20-39% ICA stenosis bilaterally.  2. Right vertebral artery is inaudible suggestive of occlusion.   ___________________________________________  Larina Earthly, M.D.   MC/MEDQ  D:  01/03/2008  T:  01/03/2008  Job:  [id number]

## 2011-05-09 NOTE — Cardiovascular Report (Signed)
NAMEDELAILA, Margaret Hobbs NO.:  [id number]   MEDICAL RECORD NO.:  [id number]          PATIENT TYPE:  INP   LOCATION:  3712                         FACILITY:  [facility]   PHYSICIAN:  Veverly Fells. Excell Seltzer, MD  DATE OF BIRTH:  [birth year]   DATE OF PROCEDURE:  06/03/2007  DATE OF DISCHARGE:                            CARDIAC CATHETERIZATION   PROCEDURES:  1. Left heart catheterization.  2. Selective coronary angiography.  3. Left ventricular angiography.  4. StarClose of the right femoral artery.   INDICATIONS:  Margaret Hobbs is a 60 year old woman who suffered an  inferior ST-elevation MI back in January of this year.  She presents to  the hospital after a recent neurologic event that sounds consistent with  TIA.  She also has complained of chest pain.  Her first troponin was  elevated, but her subsequent cardiac enzymes have all been normal  including her CPK-MBs.  In the setting of chest pain, known coronary  artery disease, and recent percutaneous intervention with a bare metal  stent in the right coronary artery, she was referred for diagnostic  coronary angiography.   Risks and indications of the procedure were explained to the patient.  Informed consent was obtained.  The right groin was prepped, draped and  anesthetized with 1% lidocaine.  Using modified Seldinger technique, a 6-  French sheath was placed in the right femoral artery.  Multiple views of  the left and right coronary arteries were taken using standard Judkins  preformed catheters.  Following selective coronary angiography, an  angled pigtail catheter was inserted into the left ventricle.  A left  ventriculogram was performed. A pullback across the aortic valve was  done. At the conclusion of the case, a StarClose device was used to seal  the right femoral arteriotomy.   FINDINGS:  Aortic pressure 158/72 with a mean of 106. Left ventricular  pressure is 158/11.   The left mainstem has minor  stenosis in its distal aspect.  Otherwise,  the left mainstem is widely patent.  The left mainstem stenosis is  nonobstructive.  Left main bifurcates into the LAD and left circumflex.   The LAD is a diffusely diseased vessel that courses down to the distal  anterior wall.  There is a small first diagonal branch and a medium-size  second diagonal branch.  The first diagonal is widely patent.  The  second diagonal has a 70% ostial stenosis that is unchanged from  previous.  The LAD has diffuse serial disease of 30% in the proximal  portion, 60% in the mid portion, and 80% in the distal portion.  All of  the LAD disease is stable from prior catheterization.   The left circumflex is a large-caliber vessel that courses down the AV  groove and provides medium-size second and third OM branches.  The  proximal circumflex has 30% stenosis.  The proximal portion of the  second OM branch has a 40% stenosis in the proximal portion.  The third  OM branch has an 80% stenosis.  To the third OM branch is a smaller  vessel.   The right coronary artery is dominant.  There  is a 60% lesion in the  proximal portion of the vessel and a 50% lesion in the mid portion of  the vessel. The stented segment that goes from the distal right coronary  artery into the PDA branch is widely patent.  There is mild diffuse  restenosis throughout that appears nonobstructive.  The posterior AV  segment has a 95% proximal stenosis that is unchanged.  This is a very  small vessel.   Left ventriculography performed in the 30-degree RAO projection  demonstrates normal LV function with an EF of 60%.   ASSESSMENT:  1. Patent stent in the distal right coronary artery and right      posterior descending artery.  2. Diffuse coronary artery disease with stable anatomy from prior      catheterization in January 2008.  3. Normal left ventricular function.  4. Improved left ventricular diastolic filling pressures (now normal).    Margaret Hobbs is much better compensated then at the time of her  myocardial infarction.  Her LV diastolic pressures at that time were in  the low 30s, and now the diastolic pressure is 11.  Her coronary artery  disease is stable, and the stent was placed in the distal right coronary  artery and right PDA is widely patent.  Recommend continued aggressive  medical therapy.  The patient is currently undergoing evaluation for her  TIA as well.      Veverly Fells. Excell Seltzer, MD  Electronically Signed     MDC/MEDQ  D:  06/03/2007  T:  06/03/2007  Job:  [id number]

## 2011-05-09 NOTE — Procedures (Signed)
CAROTID DUPLEX EXAM   INDICATION:  Followup of carotid disease.   HISTORY:  Diabetes:  No.  Cardiac:  MI in 2007, stent in January 2008.  Hypertension:  Yes.  Smoking:  No.  Previous Surgery:  No.  CV History:  Asymptomatic.  Amaurosis Fugax No, Paresthesia No, Hemiparesis No                                       RIGHT             LEFT  Brachial systolic pressure:         170               169  Brachial Doppler waveforms:         Triphasic         Triphasic  Vertebral direction of flow:        Occluded          Antegrade  DUPLEX VELOCITIES (cm/sec)  CCA peak systolic                   99                88  ECA peak systolic                   124               85  ICA peak systolic                   87                43  ICA end diastolic                   39                17  PLAQUE MORPHOLOGY:                  None              None  PLAQUE AMOUNT:                      None              None  PLAQUE LOCATION:   IMPRESSION:  1. 1%-20% bilateral internal carotid artery.  2. Right vertebral open audible, suggests occluded.         ___________________________________________  Larina Earthly, M.D.   CJ/MEDQ  D:  12/30/2009  T:  12/30/2009  Job:  [id number]

## 2011-05-09 NOTE — Assessment & Plan Note (Signed)
[facility]                            CARDIOLOGY OFFICE NOTE   AFTON, LAVALLE                 MRN:          [id number]  DATE:04/01/2008                            DOB:          [birth year]    Ramon is seen today for preop clearance for Dr. Gerrit Friends.  Unfortunately,  her husband passed 2 weeks ago.  She was out at a knitting class and  when she returned, he was found dead in the bathroom.  She was quite  emotional today.  She has a history of coronary artery disease with a  previous bare metal stent to the right coronary artery.  She had a low-  risk Myoview study on April 12, 2007.  She is not having any significant  chest pain.  Her Myoview showed an EF of 75% with a small inferior  lateral wall infarct at the base.   I told Arlene that I thought it was safe for her to have parathyroid  surgery with Dr. Gerrit Friends.  She will need to stop her Plavix, but since  she has a bare metal stent that was placed over a year ago, I think she  will be alright.  She has had a previous CVA.  She has apparent 60% left  carotid disease.  She says she just saw Dr. Arbie Cookey and he did not seemed  concerned about any of her carotid disease in regards to surgery.   REVIEW OF SYSTEMS:  Remarkable for some coldness in her fingers and  hands.  I do not know if she has had her iron checked recently, but she  has started on some multivitamins.  Her mother is with her from [place].  I talked to her at length and suggested that she be started on  some Prozac for her reactive grief.   CURRENT MEDICATIONS:  1. Aspirin a day.  2. Plavix 75 a day.  3. Metformin 5/500 b.i.d.  4. Methotrexate.  5. Toprol 100 a day.  6. Humira.  7. Hydralazine 50 t.i.d.  8. Hyzaar 125.  9. Aspirin.  10.Crestor 10 a day.   PHYSICAL EXAMINATION:  GENERAL:  Remarkable for an emotional and  depressed, white female.  VITAL SINS:  Weight is 172, blood pressure is 130/70, pulse 80 and  regular, afebrile, respiratory rate 14.  HEENT:  Unremarkable.  NECK:  I cannot hear a left bruit.  Neck is supple.  No lymphadenopathy,  no thyromegaly or JVP elevation.  LUNGS:  Clear to diaphragmatic motion.  No wheezing.  S1 and S2, normal  heart sounds.  PMI normal.  ABDOMEN:  Benign.  Bowel sounds positive.  No abdominal aortic aneurysm,  no tenderness, no hepatosplenomegaly or hepatojugular reflux.  EXTREMITIES:  Distal pulses are intact, no edema.  NEUROLOGIC:  Nonfocal.  Right lower extremity weakness from previous  CVA.  SKIN:  Warm and dry.   IMPRESSION:  1. The patient is cleared for surgery.  She is not having chest pain.      Her bare metal stent was placed over a year ago.  She can stop her  Plavix and she will see Dr. Gerrit Friends for a parathyroid surgery.  2. 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  Start Prozac 20 a day.      Follow up with primary care physician.  3. Hypertension, currently well controlled.  Continue all medications      including hydralazine t.i.d.  To take medicine the morning of      surgery.  4. Hyperlipidemia in the setting of coronary disease.  Continue      Crestor 10 mg a day.  Lipid and liver profile in 6 months.  5. Diabetes.  Hemoglobin A1c quarterly.  Will likely need sliding-      scale insulin coverage.  During surgery, it may be worthwhile for      Dr. Gerrit Friends to get a medical consult postop.   PLAN:  Overall, Cira Rue is doing well, but she has had a very difficult  time over the last year and a half.     [name], MD, [facility]  Electronically Signed    PCN/MedQ  DD: 04/01/2008  DT: 04/01/2008  Job #: [id number]

## 2011-05-09 NOTE — Op Note (Signed)
NAMEEPSIE, WALTHALL NO.:  [id number]   MEDICAL RECORD NO.:  [id number]          PATIENT TYPE:  OIB   LOCATION:  5151                         FACILITY:  [facility]   PHYSICIAN:  Velora Heckler, MD      DATE OF BIRTH:  October 07, 1951   DATE OF PROCEDURE:  04/29/2008  DATE OF DISCHARGE:                               OPERATIVE REPORT   PREOPERATIVE DIAGNOSIS:  Primary hyperparathyroidism.   POSTOPERATIVE DIAGNOSIS:  Primary hyperparathyroidism.   PROCEDURE:  Left inferior minimally invasive parathyroidectomy.   SURGEON:  Velora Heckler, MD, FACS   ASSISTANT:  Tilden Fossa, R.N.F.A.   ANESTHESIA:  General per Dr. Sheldon Silvan.   ESTIMATED BLOOD LOSS:  Minimal.   PERFORATION:  Betadine.   COMPLICATIONS:  None.   INDICATIONS:  The patient is a 60 year old white female from  [place], [place].  She had been evaluated and found to have  an elevated serum calcium level of 11.2 with an elevated parathyroid  hormone level of 147.  Bone density scan showed osteopenia.  The patient  underwent sestamibi scan, which localized an abnormal parathyroid in the  left inferior position.  The patient now comes for the surgery for  minimally invasive parathyroidectomy.   BODY OF REPORT:  Procedure is done at OR #3 at the [facility].  The patient was brought to the operating room,  placed in the supine position on the operating room table.  Following  administration of general anesthesia, the patient was positioned and  then prepped and draped in the usual strict aseptic fashion.  After  ascertaining that an adequate level of anesthesia have been achieved, a  left inferior neck incision was made with #15 blade.  Dissection was  carried down to the platysma.  Platysma was divided with the  electrocautery and subplatysmal flaps were elevated circumferentially.  Strap muscles were exposed.  Strap muscles were incised in the midline  and reflected  laterally.  Left thyroid lobe was exposed.  Dissection  below the left thyroid lobe allows for mobilization.  Venous tributaries  are divided between small Ligaclips.  Dissection was carried down to the  tracheo-esophageal groove.  Lateral border of the esophagus was defined.  Dissection was carried back to the precervical fascia.  Further  dissection revealed some benign-appearing, 3- to 4-mm lymph nodes.  The  thyroid, thymic tract was identified and appeared prominent.  It was  mobilized.  It was dissected out.  It was opened and appeared to contain  parathyroid tissue.  Dissection was carried beneath the left clavicle  and the tract was transected with the electrocautery and delivered from  the field.  Upon opening, there does appear to be an enlarged  parathyroid gland.  This was marked with a suture.  Specimen was  submitted to pathology where Dr. Guerry Bruin confirms parathyroid tissue  consistent with parathyroid adenoma.  Good hemostasis was noted.  Further exploration failed to reveal any other abnormality or any  enlarged parathyroid glands.  Therefore, Surgicel was placed in the  operative field.  Strap muscles were reapproximated in the midline with  interrupted  3-0 Vicryl sutures.  Platysma was closed with interrupted 3-  0 Vicryl sutures.  Skin was anesthetized with local Marcaine anesthetic.  Skin edges were reapproximated with a running 4-0 Monocryl subcuticular  suture.  Wound was washed and dried.  Benzoin and Steri-Strips were  applied.  Sterile dressings were applied.  The patient was awakened from  anesthesia and brought to the recovery room in stable condition.  The  patient tolerated the procedure well.      Velora Heckler, MD  Electronically Signed     TMG/MEDQ  D:  04/29/2008  T:  04/30/2008  Job:  [id number]   cc:   [name], MD, Charlott Holler, M.D.

## 2011-05-09 NOTE — Assessment & Plan Note (Signed)
[facility]                            CARDIOLOGY OFFICE NOTE   Margaret, Hobbs                 MRN:          [id number]  DATE:07/08/2007                            DOB:          Feb 05, 1951    REASON FOR VISIT:  Margaret Hobbs returns today for followup.  Unfortunately,  she has had a recent stroke.   She is a diabetic with hypercholesterolemia and hypertension who has had  a previous MI with stenting to the right coronary artery and medical  therapy for residual disease.   She was admitted to the hospital by Dr. Clelia Croft and Dr. Orlin Hilding.  This was  at the end of June .  She had significant problems with her speech and  right lower extremity weakness.   She did have bilateral carotid disease.  She was seen by Dr. Arbie Cookey.  As  far as I can tell, her disease was not greater than 80% and she is to  have this followed up as an outpatient.   She needs followup with Dr. Orlin Hilding.   The patient had her CVA while on aspirin and Plavix.   In talking to her she has not had any recurrent chest pain, PND or  orthopnea.  She is a little bit frustrated by some of her deficits.  Her  speech seems to be improved.  She has regained some of the sensation in  the right lower leg and some of the motor function in her right upper  extremity.   REVIEW OF SYSTEMS:  Her review of systems is otherwise negative.   CURRENT MEDICATIONS:  1. Plavix 75 mg daily.  2. Glipizide/Metformin 5/500.  3. Methotrexate 2.5 mg six pills a day.  4. Toprol 100 mg daily.  5. Humira 40 mg injection once every 3 weeks.  6. Hydralazine 50 t.i.d.  7. Hyzaar 100/25.  8. Aspirin one daily.   PHYSICAL EXAMINATION:  GENERAL APPEARANCE:  Her exam is remarkable for a  middle-aged white female who appears somewhat ill sitting in a  wheelchair.  Affect is somewhat depressed.  VITAL SIGNS:  Weight is 171.  Blood pressure 130/60.  Pulse is 70 and  regular.  HEENT:  Unremarkable.  NECK:  She  has a right carotid bruit.  There is no JVP elevation.  No  lymphadenopathy.  No thyromegaly.  LUNGS:  Clear with good diaphragmatic motion.  HEART:  There is an S1, S2 with a systolic ejection murmur.  The PMI is  normal.  ABDOMEN:  Benign.  Bowel sounds are positive.  No hepatosplenomegaly.  No hepatojugular reflux.  No tenderness.  No organomegaly.  EXTREMITIES:  Distal pulses are intact with no edema.  NEUROLOGICAL:  Remarkable for some dysarthria.  She has right lower  extremity weakness, mild right upper extremity weakness.  She has good  sensation in the right lower extremity.  There is some muscular weakness  in the right lower leg.   IMPRESSION:  1. Coronary disease, currently stable.  Continue aspirin and Plavix.      Not having angina.  Continue beta blocker.  Followup lipid and      liver  profile in six months.  2. Recent  cerebrovascular accident.  Followup with Dr. Arbie Cookey in      regards to carotid disease.  Clearly not a surgical candidate given      recent stroke but needs followup of her carotid disease in regards      to ulcerated plaque.  Continue aspirin and Plavix.  3. Diabetes.  She is to continue to follow up with her primary care      M.D.  Aim for tight control with hemoglobin A1c's in the 6 range.  4. Hypertension, currently well controlled.  Hydralazine added      recently.  Continue current dosages.   Fortunately, Margaret Hobbs's mother has moved up here from [place] to help  take care of her.  She will need significant rehab.  She has a followup  appointment with Dr. Orlin Hilding in regards to her recent CVA.     [name], MD, [facility]  Electronically Signed    PCN/MedQ  DD: 07/08/2007  DT: 07/08/2007  Job #: [id number]

## 2011-05-12 NOTE — Cardiovascular Report (Signed)
NAMEJESI, Margaret Hobbs NO.:  [id number]   MEDICAL RECORD NO.:  [id number]          PATIENT TYPE:  INP   LOCATION:  2008                         FACILITY:  [facility]   PHYSICIAN:  Veverly Fells. Excell Seltzer, MD  DATE OF BIRTH:  [birth year]   DATE OF PROCEDURE:  01/06/2007  DATE OF DISCHARGE:                            CARDIAC CATHETERIZATION   PROCEDURE:  Left heart catheterization, selective coronary angiography,  left ventricular angiography, PTCA and stenting of the right PDA and  StarClose of the right femoral artery.   INDICATIONS:  [name] is a 60 year old woman who came in with  chest pain.  Her initial ECG and biomarkers were nondiagnostic.  She was  admitted on January11, 2008.  This afternoon, she developed severe  chest pain and dynamic EKG changes with inferior ST elevation with  reciprocal changes.   She was referred for emergency cardiac catheterization in the setting of  her acute inferior MI.   PROCEDURAL DETAILS:  Risks and indications of the procedure were  explained in detail to the patient and her husband.  Informed consent  was obtained.  The right groin was prepped, draped and anesthetized with  1% lidocaine.  Using the modified Seldinger technique, a 6-French sheath  was placed in the right femoral artery.  The left coronary artery was  initially imaged with a JL-4 diagnostic catheter.  A JR-4 right coronary  guide catheter was inserted.  The patient was given 50 units per kg of  heparin.  The initial diagnostic images of the right coronary  demonstrated an acutely occluded right PDA.  The patient was started on  Integrilin.  Following a therapeutic ACT, a Cougar coronary guidewire  was passed easily beyond the lesion into the distal vessel.  The lesion  was predilated to 6 atmospheres with a 2.5 x 15 Maverick balloon on two  subsequent inflations.  Following balloon inflation, there was  restoration of flow in the vessel.  A 2.5 x 24 mm  Express stent was then  inserted and deployed at 12 atmospheres.  The stent was postdilated with  a 2.75 x 12-mm Quantum Maverick balloon up to 16 atmospheres in the  distal portion of the stent and 20 atmospheres in the proximal portion  of the stent.  There was 0% residual stenosis with TIMI III flow  present.  There was a minor filling defect in the very distal portion of  the vessel.  The patient did not have very good ST resolution and  continued to have some chest pain despite what appeared to be normal  flow in the epicardial coronary.   At that point, an angled pigtail catheter was inserted in the left  ventricle and pressures were recorded.  A 30 degree RAO left  ventriculogram was done.  A pullback across the aortic valve was  performed.  At the conclusion of the case, a StarClose device was used  to seal the right femoral arteriotomy.  The patient tolerated the  procedure well and had no immediate complications.   FINDINGS:  Aortic pressure 180/81 with a mean of 125, left ventricular  pressure 185/14 with an end-diastolic pressure  of 33.  There is no  significant aortic stenosis.   The left main coronary artery has nonobstructive disease present.  There  does not appear to be more than 25% stenosis in the left main stem.  It  bifurcates into the left anterior descending and left circumflex.   The left anterior descending is a diffusely diseased vessel.  There is  30% stenosis in the proximal portion of the vessel, 60% stenosis in the  midportion of the vessel.  There is a high-grade distal stenosis of  approximately 90%.  The first diagonal branch, which is a large vessel,  has a 75% ostial stenosis.   The left circumflex is also diffusely diseased.  There is 30% stenosis  in the proximal circumflex.  There is a tiny first obtuse marginal  branch.  The second obtuse marginal branch has diffuse disease of no  greater than 40%.  The third obtuse marginal has 90% stenosis in  its  proximal portion.  The true AV groove circumflex is small.   The right coronary artery is also diffusely diseased.  There is a 60%  stenosis in the proximal portion, a long 50% tubular stenosis in the  midportion, and the posterior descending artery is 100% occluded.  The  posterior AV segment is heavily diseased with a 99% lesion in the  proximal portion, and it is a small diffusely diseased vessel.   Left ventriculography demonstrates normal left ventricular function with  an estimated left ventricular ejection fraction of 55%.  There is no  significant mitral regurgitation.   Of note, the right femoral angiogram demonstrates access in the right  common femoral artery.  That portion of the vessel is angiographically  normal.  The superficial femoral artery appears to have least an 80%  ostial stenosis.   ASSESSMENT:  1. Acute inferior myocardial infarction secondary to posterior      descending artery occlusion.  Treated with an Express stent as      described above with 0% residual stenosis.  2. Severe three-vessel coronary artery disease.  3. Normal left ventricular systolic function with diastolic      dysfunction present.   PLAN:  As described above, PCI with bare metal stent.  The patient will  continue on aspirin and clopidogrel.  Integrilin will be run for 18  hours.  We will need to review her angiograms carefully and decide the  best definitive treatment option for the remainder of her coronary  artery disease.  She has heavy diffuse coronary disease and may be best  treated medically from here.  I will review her films with my colleagues  and make that determination.      Veverly Fells. Excell Seltzer, MD  Electronically Signed     MDC/MEDQ  D:  01/06/2007  T:  01/07/2007  Job:  [id number]

## 2011-05-12 NOTE — Discharge Summary (Signed)
[name], Hobbs NO.:  [id number]   MEDICAL RECORD NO.:  [id number]          PATIENT TYPE:  INP   LOCATION:  2916                         FACILITY:  [facility]   PHYSICIAN:  Margaret Pick. Eden Emms, MD, FACCDATE OF BIRTH:  May 29, 1951   DATE OF ADMISSION:  01/04/2007  DATE OF DISCHARGE:  01/09/2007                               DISCHARGE SUMMARY   PRIMARY CARDIOLOGIST:  Dr. Charlton Haws.   PRINCIPAL DIAGNOSIS:  Inferior S2 segmental elevation myocardial  infarction.   SECONDARY DIAGNOSIS:  1. Coronary artery disease.  2. Hypertension.  3. Hyperlipidemia  4. Type 2 diabetes mellitus.  5. Rheumatoid arthritis.  6. Gastroeophageal reflux disease.  7. History of hysterectomy.  8. Urinary tract infection.   ALLERGIES:  NO KNOWN DRUG ALLERGIES.   PROCEDURES:  Left heart cardiac catheterization with successful PCI and  stenting of the distal RPDA with placement of a 2.5 x 24 mm Express bare  metal stent.   HISTORY OF PRESENT ILLNESS:  [age]-year-old Caucasian female with no  prior history of coronary artery disease, however, with multiple risk  factors, including hypertension, hyperlipidemia, and diabetes, who was  in her usual state of health until the evening of January 03, 2007 when  she developed substernal chest discomfort with radiation to bilateral  arms without other associated symptoms.  She had recurrent symptoms on  the morning of January 11, prompting her to present to the [facility]  Emergency Department where ECG was without any acute changes and cardiac  enzymes were negative.  She was admitted for further evaluation.   HOSPITAL COURSE:  Mrs. Hollick had normal CKs and MBs with mild  elevation of troponin at 0.08 and then 0.07.  Given the pretest  probability that she would have coronary disease, considering her risk  factors, decision was made to proceed with left heart cardiac  catheterization.  On January 13 in the afternoon she developed  recurrent  stabbing substernal chest pressure with diaphoresis, shortness of breath  and 2 mm inferior ST segment elevation on ECG.  She was then taken  emergently to the cardiac cath lab where left heart cardiac  catheterization was performed revealing a total occlusion of the RPDA  with otherwise significant multivessel disease including a 90% mid to  distal LAD as well as a 90% stenosis in the second obtuse marginal.  The  PDA was felt to be the infarct vessel and it was successfully treated  with a 2.5 x 24 mm Express bare metal stent.  Notably her EF was normal  at 55% by left ventriculography.  Post-procedure she had no recurrent  chest pain.  Her films were reviewed and the decision was made to  continue medical therapy for her LAD and marginal disease given the  diffuse and calcific nature of her disease.  She remained on beta  blocker and ARB which has been titrated for improved blood pressure  control, as well as aspirin, Statin and Plavix.  She is being discharged  home today in satisfactory condition.   DISCHARGE LABS:  Hemoglobin 12.4, hematocrit 36.4, WBC 7.3, platelets  315, sodium 137, potassium 3.7, chloride 103,  CO2 24, BUN 11, creatinine  0.73, glucose 153, calcium 9.9.  Total bilirubin 0.7, alkaline  phosphatase 44, AST 16, ALT 15, total protein 6.4, albumin 3.4.  CK 432,  MB 41.2, troponin I 15.56.  Urinalysis revealed moderate leukocytes and  a few bacteria.   DISPOSITION:  The patient is being discharged home today in good  condition.   FOLLOWUP PLANS AND APPOINTMENTS:  She is asked to follow up with her  primary care physician, Dr. Clelia Croft, in approximately two weeks.  She is  scheduled to follow up with Dr. Eden Hobbs on January 31 at 10:30 a.m.   DISCHARGE MEDICATIONS:  1. Aspirin 325 mg daily.  2. Plavix 75 mg daily.  3. Coreg 25 mg 1-1/2 tablets b.i.d.  4. Cozaar 100 mg daily.  5. Hydralazine 50 mg t.i.d.  6. Cipro 250 mg b.i.d. x6 additional days.  7.  Glipizide 5 mg b.i.d.  8. Metformin 500 mg b.i.d.  9. Lipitor 80 mg nightly.  10.Nitroglycerin 0.4 mg sublingual p.r.n. chest pain.   OUTSTANDING LABS:  None.   DURATION OF DISCHARGE ENCOUNTER:  45 minutes including physician time.      Margaret Hobbs, Margaret Hobbs      Margaret Pick. Eden Emms, MD, [facility]  Electronically Signed    CB/MEDQ  D:  01/09/2007  T:  01/10/2007  Job:  [id number]   cc:   Dr. Clelia Croft

## 2011-05-12 NOTE — Assessment & Plan Note (Signed)
[facility]                            CARDIOLOGY OFFICE NOTE   Margaret, Hobbs                 MRN:          [id number]  DATE:04/11/2007                            DOB:          [birth year]    [name] returns today for followup.  She has known coronary artery  disease.  She had angioplasty and stenting of the PDA in January 2008.  She has had some recurrent chest pain, it is not clear if this is  angina.  She has been compliant with her meds.  She has been at cardiac  rehab doing well.  Her blood pressure initially has been a little high  and we switched her over to Hyzaar and this seems to have done the  trick.   The patient did have moderate disease in her other arteries and we  thought that this was best treated medically, in particular, she had a  90% third obtuse marginal branch and a 90% distal LAD lesion.   Given the multivessel nature of her disease, and small diabetic vessels,  I told Kalany that I thought it would be best for her to have a stress  Myoview study to rule out ischemia as opposed to going directly to cath.   She seems comfortable with this idea.  Her 10 point review of systems is  remarkable for some neuropathy in her legs.  Cardiac rehab has been  going well.  She has not had any PND or orthopnea, no syncope, no  palpitations.   Her medications include:  1. An aspirin daily.  2. Plavix 75 mg daily.  3. Glipizide.  4. Metformin 5/500.  5. Methotrexate 2.5 daily.  6. Toprol 100 daily.  7. Humira 40 mg injection every 3 weeks.  8. Hydralazine 50 t.i.d.  9. Hyzaar 100/25.   On exam the blood pressure is 140/70, pulse 81 and regular.  HEENT:  Normal carotids without bruit.  LUNGS:  Clear.  HEART:  There is an S1, S2 with normal heart sounds.  ABDOMEN:  Is benign.  LOWER EXTREMITIES:  Intact pulse.  No edema.  NEURO:  Nonfocal.  SKIN:  Is warm and dry.   I went over her [facility] monitoring  strips and indeed her blood  pressure and pulse have been excellent.   IMPRESSION:  Multivessel disease, moderate in the left system.  Status  post stenting of the PDA.  Chest pain not clear to be angina.  Continue  aspirin and Plavix.  Follow up stress Myoview study.  The patient will  call us if she has any change in her chest pain syndrome.  She will  continue her aspirin and Plavix.   I will have to review her chart also since she is a diabetic and not on  a statin drug.   The patient's blood pressure is much better controlled on the  combination of hydralazine and Hyzaar.  She will continue her beta  blocker.   She will followup with a primary care MD in regards to her hemoglobin  A1C.  Further recommendation will be based on the results of her stress  test.     Margaret Hobbs. Eden Emms, MD, [facility]  Electronically Signed    PCN/MedQ  DD: 04/11/2007  DT: 04/11/2007  Job #: [id number]

## 2011-05-12 NOTE — H&P (Signed)
NAMELETHIA, DONLON NO.:  [id number]   MEDICAL RECORD NO.:  [id number]          PATIENT TYPE:  INP   LOCATION:  1845                         FACILITY:  [facility]   PHYSICIAN:  Peter C. Eden Emms, MD, FACCDATE OF BIRTH:  05/12/1951   DATE OF ADMISSION:  01/04/2007  DATE OF DISCHARGE:                              HISTORY & PHYSICAL   Admission for chest pain.   Ms. Jean Rosenthal is a delightful 60 year old patient of Dr. Clelia Croft.  She was  sent to the emergency room for chest pain.  The patient has coronary  risk factors including positive family history of hyperlipidemia,  diabetes and hypertension.   The patient had substernal chest pain last night.  The pain was central,  it radiated to her arms.  There was no diaphoresis or shortness of  breath.  She had recurrent pain this morning.  She seems to think there  was some relief with nitroglycerin in the emergency room.  She has not  had a previous cardiac workup.  She is currently pain-free.   REVIEW OF SYSTEMS:  Is remarkable for the possibility of some  indigestion and reflux.  She was eating a tuna fish sandwich at the time  of her initial pain.  She has no history of peptic ulcer disease.   Her risk factors were as follows:  Her diabetes.  She has been on  insulin.  She has been on medication for about 2 years.   Her hemoglobin A1c she says is 5.8.   She was recently tried on Cozaar a couple of days ago but feels that  this may have made her chest pain worse.  Similarly, she was on generic  metoprolol over the last week and is not sure if this was causing some  problems.   PAST SURGICAL HISTORY:  Remarkable only for a hysterectomy.   SHE DENIES ANY ALLERGIES.   She is originally from [place] and [place] and is down here with her  husband.  She is happily married.  She likes to crochet and do lace  work.  She has one adult son who is healthy.   FAMILY HISTORY:  Remarkable for diabetes on her mother's  side.   MEDICATIONS:  1. Lopressor long-acting 50 mg a day  2. Glipizide.  3. Metformin 5/500 b.i.d.  4. Folic acid 1 mg a day.  5. Methotrexate 2.5 mg a day for arthritis.  6. Co-Enzyme Q.  7. Cozaar 50 mg a day.  8. She is also on [place].   EXAM:  The blood pressure is elevated at 189/90.  HEENT:  Normal.  There is no lymphadenopathy, no carotid bruits.  LUNGS:  Clear.  HEART SOUNDS:  Normal with an S1, S2.  ABDOMEN:  Benign.  She has a previous hysterectomy scar.  There is no  abdominal aortic aneurysm.  There is no tenderness.  Distal pulses intact.  No edema.   Her EKG is normal.  Her chest x-ray shows no active disease and her  point of care enzymes are negative.   IMPRESSION:  Chest pain is a 60 year old woman with partial relief with  nitroglycerin  in the emergency room and multiple coronary risk factors  including diabetes.  The patient will be admitted for rule out  myocardial infarction.  Will cycle her cardiac enzymes.  Will increase  her Cozaar to 100 mg a day.   We will change her Toprol over to Lopressor 25 twice daily.  Her heart  rate was relatively low in the emergency room at 60.   If the patient rules out for myocardial infarction, I suspect she can  have an outpatient Myoview.   In regards to the patient's diabetes, it appears well controlled.  She  is not on insulin.  We will continue her glipizide and metformin.  We  will check her fasting sugars.   She does not use sliding scale at home.   She had her lipids tested last week but I suspect given her metabolic  syndrome that she should be on a Statin drug and we will start Lipitor  at 20 mg a day.   Further recommendations will depend on her chest pain, clinical course  as well as her enzymes and EKG in the morning.      [name], MD, [facility]  Electronically Signed     PCN/MEDQ  D:  01/04/2007  T:  01/04/2007  Job:  [id number]

## 2011-05-12 NOTE — Assessment & Plan Note (Signed)
[facility]                            CARDIOLOGY OFFICE NOTE   Margaret Hobbs, Margaret Hobbs                 MRN:          [id number]  DATE:01/24/2007                            DOB:          12-03-1951    [name] seen today in followup.  I saw her in the hospital during  January 11 to January 16, where she suffered an inferior wall myocardial  infarction.  This was treated with a bare metal stent.   The patient is a longstanding diabetic.   She also has hypertension, hyperlipidemia.   Her heart disease was a new diagnosis for her.   She presented today with chest pain and, subsequently, had a ST  elevation MI while waiting for the weekend for her heart  catheterization.   Since her discharge, she unfortunately has not been seen at cardiac  rehab, apparently they had the wrong phone number.  She has been walking  without chest pain.   She has had multiple issues with her medications and it took about 20  minutes to go over all of these.   She had stopped taking her Lipitor because she thought it kept her up at  night.  I told her we would switch her to Crestor and she could take  this in the morning if needed.  She has not been taking aspirin with her  Plavix.  I explained the importance to her of 2 antiplatelet agents  given her bare metal stent.  We will try her on a baby aspirin at 81 mg  to take in conjunction with food.  She will continue her Plavix.   The patient's blood pressure has been high.   She has felt poorly on Coreg with some dizziness and lethargy.  She has  previously been on Toprol.  We will stop her Coreg and place her on  Toprol 100 mg daily.   She also had some issues with the hydralazine, feeling that it made her  feel weak and woozy.   I am not sure that we can wean the hydralazine completely as her blood  pressure has been fairly high.  We will start by cutting it back to  twice a day when we add the  Toprol.   The patient has significant rheumatoid arthritis and is also on  methotrexate and Humira.   Her other medications include her diabetes medicines, glipizide,  metformin 5/500.   REVIEW OF SYSTEMS:  Remarkable for no significant chest pain, PND, or  orthopnea and no palpations.  Her weight is stable at 185.  Blood pressure is 150/82 with a pulse of  98 today.  HEENT:  Normal.  Carotids normal without bruits.  LUNGS:  Clear.  There is a S1, S2 with normal heart sounds.  ABDOMEN:  Benign.  Lower extremities with intact pulses, no edema.   EKG shows a sinus rhythm with a previous inferior wall MI with Q waves  in #2, #3, and F and poor R-wave progression.   IMPRESSION:  Coronary artery disease, bare metal stenting with the 2.5  mm Express stent of the distal right coronary artery.  The patient does have some residual left-sided disease which is fairly  diffuse in the OM and the LAD which is best treated medically.   She will need a followup Myoview in about 3 months.   Please see the body of the dictation for multiple medication changes to  be made.   She will take a baby aspirin and Plavix to keep her stent open and for  her residual disease.   We will try to switch her to Crestor for her hyperlipidemia.   We will add back Toprol in regards to her beta blocker therapy.  This is  extremely important, although Coreg has less insulin resistance, she  apparently will not tolerate this and we need to get her back on a beta  blocker since her heart rate and blood pressure are fairly high.  We  will try to titrate back on the hydralazine as her blood pressure  permits.   I will see her back in about 4 weeks and see how she is doing with the  cardiac rehab as well as all of her medication adjustments.     [name], MD, [facility]  Electronically Signed    PCN/MedQ  DD: 01/24/2007  DT: 01/24/2007  Job #: [id number]

## 2011-05-12 NOTE — Assessment & Plan Note (Signed)
[facility]                            CARDIOLOGY OFFICE NOTE   TEHILLAH, CIPRIANI                 MRN:          [id number]  DATE:02/21/2007                            DOB:          01-13-1951    Ms. Jean Rosenthal returns today for followup.  She had a subendocardial MI on  January 06, 2007.  She had a bare metal stent to the PDA.  She has  residual 3-vessel disease with small vessels in the typical diabetic  fashion.  Dr. Excell Seltzer and I felt that medical therapy was warranted after  her stenting of the PDA.   The patient has not had any significant chest pain, PND, or orthopnea.   Her blood pressure continues to run too high at [facility].  We  have readings that are anywhere from 150-168.   I told her that we would increase her hydralazine back to 50 t.i.d. and  change her Cozaar to Hyzaar.   I will see her back in 2 months and recheck her BMET.   She continues to look a little bit depressed.  I know that she does not  like taking all of her medicines for her diabetes, blood pressure, and  coronary disease.  I tried to explain to her that she is doing fairly  well, but it is very important to control her risk factors and blood  pressure given her residual disease.   CURRENT MEDICATIONS:  1. An aspirin a day.  2. Plavix 75 a day.  3. Glipizide metformin 5/500 b.i.d.  4. Hyzaar 100/25.  5. Methotrexate.  6. Toprol 100 a day.  7. Hydralazine 50 t.i.d.  8. Humira injection 40 mg every 2 weeks.   EXAM:  Blood pressure is 180/75, pulse 76 and regular.  HEENT:  Normal.  Carotids are normal without bruit.  LUNGS:  Clear.  There is an S1, S2 with normal heart sounds.  ABDOMEN:  Benign.  LOWER EXTREMITIES:  Intact pulses with no edema.  NEURO:  Nonfocal.  SKIN:  Warm and dry.   IMPRESSION:  Coronary artery disease, subendocardial myocardial  infarction with bare metal stenting of the posterior descending artery  in January.  Residual 3  vessel disease with good left ventricular  function.  No recurrent angina.  Continue aspirin and Plavix for  anticoagulation.  The patient will follow up with her primary M.D., Dr.  Clelia Croft in regards to her hemoglobin A1c and sugars.  Blood pressure  suboptimally controlled.  Increasing hydralazine to t.i.d. and Cozaar  being switched to Hyzaar.  Follow up with me in 2 months to check her  blood pressure and BMET.   The patient, being a diabetic with 3 vessel disease will likely need a  stress test on a yearly basis.   We will plan on doing this later this year in December or the first part  of January.   She has nitroglycerin at home in case she needs it, but has not been  having chest pain.     [name], MD, [facility]  Electronically Signed    PCN/MedQ  DD: 02/21/2007  DT: 02/21/2007  Job #: [id number]

## 2011-10-12 LAB — COMPREHENSIVE METABOLIC PANEL WITH GFR
AST: 14
Albumin: 3.3 — ABNORMAL LOW
Alkaline Phosphatase: 30 — ABNORMAL LOW
BUN: 14
CO2: 29
Chloride: 108
GFR calc Af Amer: >60
GFR calc non Af Amer: >60
Potassium: 3.3 — ABNORMAL LOW
Total Bilirubin: 0.4

## 2011-10-12 LAB — I-STAT 8, (EC8 V) (CONVERTED LAB)
Acid-Base Excess: 2
BUN: 20
Bicarbonate: 26.1 — ABNORMAL HIGH
Chloride: 105
Glucose, Bld: 164 — ABNORMAL HIGH
HCT: 34 — ABNORMAL LOW
Hemoglobin: 11.6 — ABNORMAL LOW
Operator id: 284141
Potassium: 3.4 — ABNORMAL LOW
Sodium: 138
TCO2: 27
pCO2, Ven: 39.9 — ABNORMAL LOW
pH, Ven: 7.423 — ABNORMAL HIGH

## 2011-10-12 LAB — BASIC METABOLIC PANEL
Calcium: 9.6
Creatinine, Ser: 0.9
GFR calc Af Amer: >60
GFR calc non Af Amer: >60
Sodium: 141

## 2011-10-12 LAB — POCT CARDIAC MARKERS
CKMB, poc: <1 — ABNORMAL LOW
Myoglobin, poc: 39.5
Operator id: 284141
Troponin i, poc: <0.05

## 2011-10-12 LAB — HEMOGLOBIN A1C: Hgb A1c MFr Bld: 5.6

## 2011-10-12 LAB — COMPREHENSIVE METABOLIC PANEL
ALT: 11
Calcium: 9.8
Creatinine, Ser: 0.74
Glucose, Bld: 99
Sodium: 142
Total Protein: 6.1

## 2011-10-12 LAB — POCT I-STAT CREATININE
Creatinine, Ser: 0.9
Operator id: 284141

## 2011-10-12 LAB — HEPARIN LEVEL (UNFRACTIONATED)
Heparin Unfractionated: 0.69
Heparin Unfractionated: 0.92 — ABNORMAL HIGH

## 2011-10-12 LAB — LIPID PANEL
Cholesterol: 116
HDL: 40
LDL Cholesterol: 57
Total CHOL/HDL Ratio: 2.9
Triglycerides: 94
VLDL: 19

## 2011-10-12 LAB — CBC
HCT: 31.6 — ABNORMAL LOW
Hemoglobin: 10.3 — ABNORMAL LOW
Hemoglobin: 10.7 — ABNORMAL LOW
MCHC: 34
MCV: 87.2
Platelets: 270
RBC: 3.49 — ABNORMAL LOW
RBC: 3.63 — ABNORMAL LOW
RDW: 12.7
WBC: 6.5
WBC: 6.6

## 2011-10-12 LAB — TROPONIN I
Troponin I: 0.02
Troponin I: 0.02

## 2011-10-12 LAB — CK TOTAL AND CKMB (NOT AT ARMC)
CK, MB: 0.7
CK, MB: 0.7
Relative Index: INVALID
Relative Index: INVALID
Relative Index: INVALID
Total CK: 28
Total CK: 32

## 2011-10-12 LAB — TSH: TSH: 1.535

## 2011-10-12 LAB — PROTIME-INR
INR: 1.1
INR: 1.1
Prothrombin Time: 14.5
Prothrombin Time: 14.7

## 2011-10-12 LAB — APTT: aPTT: 161 — ABNORMAL HIGH

## 2011-10-18 ENCOUNTER — Ambulatory Visit (INDEPENDENT_AMBULATORY_CARE_PROVIDER_SITE_OTHER): Payer: Medicare Other | Admitting: Cardiovascular Disease

## 2011-10-18 ENCOUNTER — Encounter: Payer: Self-pay | Admitting: Cardiovascular Disease

## 2011-10-18 DIAGNOSIS — E7800 Pure hypercholesterolemia, unspecified: Secondary | ICD-10-CM

## 2011-10-18 DIAGNOSIS — I2510 Atherosclerotic heart disease of native coronary artery without angina pectoris: Secondary | ICD-10-CM

## 2011-10-18 DIAGNOSIS — E119 Type 2 diabetes mellitus without complications: Secondary | ICD-10-CM

## 2011-10-18 DIAGNOSIS — I10 Essential (primary) hypertension: Secondary | ICD-10-CM

## 2011-10-18 NOTE — Assessment & Plan Note (Signed)
Well controlled.  Continue current medications and low sodium Dash type diet.    

## 2011-10-18 NOTE — Assessment & Plan Note (Signed)
Discussed low carb diet.  Target hemoglobin A1c is 6.5 or less.  Continue current medications. A1c is under 6

## 2011-10-18 NOTE — Progress Notes (Signed)
Margaret Hobbs is seen today for F/U of elevated lipids, HTN and CAD she had her last cath in 05/2007 and had a patent stent to the RCA which was placed in January of 2008. She has not had any SSCP. Her BP has been under good controll. She sees Dr. Clelia Croft for blood work in a couple of weeks. She is on humera for psoriasis and would be considered immunocompromized. She already got a flue shot. She needs a refill on her hydralazine . She is a bit depressed again this am. She has gained some weight. She has had a previous CVA with normal carotids and has a hard time with some LLE weakness  ROS: Denies fever, malais, weight loss, blurry vision, decreased visual acuity, cough, sputum, SOB, hemoptysis, pleuritic pain, palpitaitons, heartburn, abdominal pain, melena, lower extremity edema, claudication, or rash.  All other systems reviewed and negative  General: Affect appropriate Healthy:  appears stated age HEENT: normal Neck supple with no adenopathy JVP normal no bruits no thyromegaly Lungs clear with no wheezing and good diaphragmatic motion Heart:  S1/S2 no murmur,rub, gallop or click PMI normal Abdomen: benighn, BS positve, no tenderness, no AAA no bruit.  No HSM or HJR Distal pulses intact with no bruits No edema Neuro non-focal Skin warm and dry No muscular weakness   Current Outpatient Prescriptions  Medication Sig Dispense Refill  . adalimumab (HUMIRA) 40 MG/0.8ML injection Inject 40 mg into the skin every 14 (fourteen) days.        . Ascorbic Acid (VITAMIN C) 500 MG tablet Take 500 mg by mouth daily.        [name] aspirin 81 MG tablet Take 81 mg by mouth daily.        . calcium-vitamin D (OSCAL) 250-125 MG-UNIT per tablet Take 1 tablet by mouth daily.        . clopidogrel (PLAVIX) 75 MG tablet Take 1 tablet (75 mg total) by mouth daily.  90 tablet  3  . co-enzyme Q-10 30 MG capsule Take 30 mg by mouth daily.        . cyanocobalamin 1000 MCG tablet 1 tab po qd       . folic acid (FOLVITE) 1 MG  tablet Take 1 mg by mouth daily.        [name] glipiZIDE-metformin (METAGLIP) 5-500 MG per tablet Take 1 tablet by mouth 2 (two) times daily before a meal.        . hydrALAZINE (APRESOLINE) 50 MG tablet Take 1 tablet (50 mg total) by mouth 2 (two) times daily.  180 tablet  3  . losartan-hydrochlorothiazide (HYZAAR) 100-25 MG per tablet Take 1 tablet by mouth daily.  90 tablet  3  . metoprolol (TOPROL-XL) 100 MG 24 hr tablet Take 1 tablet (100 mg total) by mouth daily.  90 tablet  3  . nitroGLYCERIN (NITROSTAT) 0.4 MG SL tablet Place 1 tablet (0.4 mg total) under the tongue every 5 (five) minutes as needed.  90 tablet  3  . Risedronate Sodium (ATELVIA PO) 35 mg 1 tab per week       . rosuvastatin (CRESTOR) 10 MG tablet Take 1 tablet (10 mg total) by mouth daily.  90 tablet  3  . vitamin A 16109 UNIT capsule Take 10,000 Units by mouth daily.        [name] VITAMIN D, CHOLECALCIFEROL, PO 1 tab po qd      . vitamin E 400 UNIT capsule Take 400 Units by mouth daily.  Allergies  Remicade  Electrocardiogram:  Assessment and Plan

## 2011-10-18 NOTE — Assessment & Plan Note (Signed)
Cholesterol is at goal.  Continue current dose of statin and diet Rx.  No myalgias or side effects.  F/U  LFT's in 6 months. Lab Results  Component Value Date   LDLCALC  Value: 57        Total Cholesterol/HDL:CHD Risk Coronary Heart Disease Risk Table                     Men   Women  1/2 Average Risk   3.4   3.3 06/03/2007             

## 2011-10-18 NOTE — Assessment & Plan Note (Signed)
Stable with no angina and good activity level.  Continue medical Rx  

## 2011-10-18 NOTE — Patient Instructions (Signed)
Your physician wants you to follow-up in: 6 MONTHS You will receive a reminder letter in the mail two months in advance. If you don't receive a letter, please call our office to schedule the follow-up appointment. 

## 2011-12-12 ENCOUNTER — Other Ambulatory Visit: Payer: Self-pay

## 2011-12-12 ENCOUNTER — Encounter (HOSPITAL_COMMUNITY): Payer: Self-pay

## 2011-12-12 ENCOUNTER — Emergency Department (HOSPITAL_COMMUNITY)
Admission: EM | Admit: 2011-12-12 | Discharge: 2011-12-12 | Disposition: A | Discharge status: Home / Self Care | Payer: Medicare Other | Attending: Emergency Medicine | Admitting: Emergency Medicine

## 2011-12-12 ENCOUNTER — Telehealth: Payer: Self-pay | Admitting: Cardiovascular Disease

## 2011-12-12 DIAGNOSIS — I10 Essential (primary) hypertension: Secondary | ICD-10-CM | POA: Insufficient documentation

## 2011-12-12 DIAGNOSIS — F3289 Other specified depressive episodes: Secondary | ICD-10-CM | POA: Insufficient documentation

## 2011-12-12 DIAGNOSIS — Z79899 Other long term (current) drug therapy: Secondary | ICD-10-CM | POA: Insufficient documentation

## 2011-12-12 DIAGNOSIS — R079 Chest pain, unspecified: Secondary | ICD-10-CM | POA: Insufficient documentation

## 2011-12-12 DIAGNOSIS — F329 Major depressive disorder, single episode, unspecified: Secondary | ICD-10-CM | POA: Insufficient documentation

## 2011-12-12 DIAGNOSIS — E119 Type 2 diabetes mellitus without complications: Secondary | ICD-10-CM | POA: Insufficient documentation

## 2011-12-12 DIAGNOSIS — E7800 Pure hypercholesterolemia, unspecified: Secondary | ICD-10-CM | POA: Insufficient documentation

## 2011-12-12 DIAGNOSIS — Z7982 Long term (current) use of aspirin: Secondary | ICD-10-CM | POA: Insufficient documentation

## 2011-12-12 DIAGNOSIS — M129 Arthropathy, unspecified: Secondary | ICD-10-CM | POA: Insufficient documentation

## 2011-12-12 DIAGNOSIS — I252 Old myocardial infarction: Secondary | ICD-10-CM | POA: Insufficient documentation

## 2011-12-12 DIAGNOSIS — I2510 Atherosclerotic heart disease of native coronary artery without angina pectoris: Secondary | ICD-10-CM | POA: Insufficient documentation

## 2011-12-12 DIAGNOSIS — E785 Hyperlipidemia, unspecified: Secondary | ICD-10-CM | POA: Insufficient documentation

## 2011-12-12 DIAGNOSIS — Z8673 Personal history of transient ischemic attack (TIA), and cerebral infarction without residual deficits: Secondary | ICD-10-CM | POA: Insufficient documentation

## 2011-12-12 LAB — POCT I-STAT TROPONIN I: Troponin i, poc: 0 ng/mL (ref 0.00–0.08)

## 2011-12-12 MED ORDER — METOPROLOL SUCCINATE ER 100 MG PO TB24: 100.0000 mg | ORAL_TABLET | Freq: Every day | ORAL | Status: DC

## 2011-12-12 NOTE — ED Notes (Signed)
Pt. Developed chest tightness 1 week ago while having an argument with her mother .  The tightness went away and then came back this am.  Denies any sob, n/v skin is w/d, resp. E/u.  Pt. Had a heart attack with stent placement in 2008. She states, "It does not feel the same, I think it is anxiety"

## 2011-12-12 NOTE — Telephone Encounter (Signed)
Pt needs a refill on metaprolol ER 100mg  qd called into [facility]

## 2011-12-12 NOTE — ED Notes (Addendum)
Pt states she was at md office to get humira shot.  Told nurse she was having tightness but not pain in her chest.  Has history of anxiety.  Pressure has gone away completely.  Pt questioning if she needs to stay.  Pt has cardiac history 3 yrs ago, but this feels more like anxiety.  Pt denies n/v and denies sweating or feeling ill.

## 2011-12-12 NOTE — ED Provider Notes (Signed)
History     CSN: [id number] Arrival date & time: 12/12/2011 10:18 AM   First MD Initiated Contact with Patient 12/12/11 1317      Chief Complaint  Patient presents with  . Chest Pain    (Consider location/radiation/quality/duration/timing/severity/associated sxs/prior treatment) HPI Comments: Patient was seen her primary care physician today for her Humira shot for her rheumatoid arthritis and mention she had some mild chest tightness.  She had no associated shortness of breath, nausea, diaphoresis and it did not change with exertion.  Patient felt that it might be anxiety.  It was not similar to when she had her past heart attack.  Patient's pain did not radiate.  Her primary care physician insisted she come to the emergency department for further evaluation.  By the time I was able to evaluate the patient the patient had no further pain and did not have pain for the last few hours.  She's had no cough or cold symptoms and no fevers.  Patient is a 60 y.o. female presenting with chest pain. The history is provided by the patient. No language interpreter was used.  Chest Pain The chest pain began 3 - 5 hours ago. Duration of episode(s) is 1 hour. Chest pain occurs constantly. The chest pain is resolved. The severity of the pain is mild. The quality of the pain is described as tightness. The pain does not radiate. Pertinent negatives for primary symptoms include no fever, no fatigue, no syncope, no shortness of breath, no cough, no wheezing, no palpitations, no abdominal pain, no nausea, no vomiting, no dizziness and no altered mental status.     Past Medical History  Diagnosis Date  . TIA (transient ischemic attack)   . UTI (urinary tract infection)   . HLD (hyperlipidemia)   . Cerebrovascular disease   . Aphasia   . DM2 (diabetes mellitus, type 2)   . CVA (cerebral vascular accident)   . Hyperparathyroidism   . Arthritis   . HTN (hypertension)   . Hypercholesteremia   . MI  (myocardial infarction)   . CAD (coronary artery disease)   . Depressed   . Diabetes mellitus     Past Surgical History  Procedure Date  . Hysterectomy - unknown type     Family History  Problem Relation Age of Onset  . Diabetes    . Hypertension    . Alcohol abuse    . Coronary artery disease      History  Substance Use Topics  . Smoking status: Never Smoker   . Smokeless tobacco: Not on file  . Alcohol Use: No    OB History    Grav Para Term Preterm Abortions TAB SAB Ect Mult Living            1      Review of Systems  Constitutional: Negative.  Negative for fever, chills and fatigue.  HENT: Negative.   Eyes: Negative.  Negative for discharge and redness.  Respiratory: Positive for chest tightness. Negative for cough, shortness of breath and wheezing.   Cardiovascular: Positive for chest pain. Negative for palpitations and syncope.  Gastrointestinal: Negative.  Negative for nausea, vomiting, abdominal pain and diarrhea.  Genitourinary: Negative.  Negative for dysuria and vaginal discharge.  Musculoskeletal: Negative.  Negative for back pain.  Skin: Negative.  Negative for color change and rash.  Neurological: Negative.  Negative for dizziness, syncope and headaches.  Hematological: Negative.  Negative for adenopathy.  Psychiatric/Behavioral: Negative.  Negative for confusion and altered mental  status.  All other systems reviewed and are negative.    Allergies  Remicade and Sulfa antibiotics  Home Medications   Current Outpatient Rx  Name Route Sig Dispense Refill  . ADALIMUMAB 40 MG/0.8ML [place] KIT Subcutaneous Inject 40 mg into the skin every 14 (fourteen) days.      [name] VITAMIN C 500 MG PO TABS Oral Take 500 mg by mouth daily.      . ASPIRIN 81 MG PO TABS Oral Take 81 mg by mouth daily.      [name] CALCIUM-VITAMIN D 250-125 MG-UNIT PO TABS Oral Take 1 tablet by mouth daily.      [name] CLOPIDOGREL BISULFATE 75 MG PO TABS Oral Take 1 tablet (75 mg total) by mouth  daily. 90 tablet 3  . COENZYME Q10 30 MG PO CAPS Oral Take 30 mg by mouth 2 (two) times a week.     . CYANOCOBALAMIN 1000 MCG PO TABS Oral Take 1,000 mcg by mouth daily.     [name] FOLIC ACID 1 MG PO TABS Oral Take 1 mg by mouth daily.      [name] GLIPIZIDE-METFORMIN HCL 5-500 MG PO TABS Oral Take 1 tablet by mouth 2 (two) times daily before a meal.      . HYDRALAZINE HCL 50 MG PO TABS Oral Take 1 tablet (50 mg total) by mouth 2 (two) times daily. 180 tablet 3  . LOSARTAN POTASSIUM-HCTZ 100-25 MG PO TABS Oral Take 1 tablet by mouth daily. 90 tablet 3  . METOPROLOL SUCCINATE ER 100 MG PO TB24 Oral Take 1 tablet (100 mg total) by mouth daily. 90 tablet 3  . NITROGLYCERIN 0.4 MG SL SUBL Sublingual Place 1 tablet (0.4 mg total) under the tongue every 5 (five) minutes as needed. 90 tablet 3  . ATELVIA PO Oral Take 35 mg by mouth once a week. Monday    . ROSUVASTATIN CALCIUM 10 MG PO TABS Oral Take 1 tablet (10 mg total) by mouth daily. 90 tablet 3  . VITAMIN A 40981 UNITS PO CAPS Oral Take 10,000 Units by mouth daily.      [name] VITAMIN E 400 UNITS PO CAPS Oral Take 400 Units by mouth daily.        BP 136/68  Pulse 68  Temp(Src) 98.8 F (37.1 C) (Oral)  Resp 16  Ht 5\' 2"  (1.575 m)  Wt 186 lb (84.369 kg)  BMI 34.02 kg/m2  SpO2 100%  Physical Exam  Constitutional: She is oriented to person, place, and time. She appears well-developed and well-nourished.  Non-toxic appearance. She does not have a sickly appearance.  HENT:  Head: Normocephalic and atraumatic.  Eyes: Conjunctivae, EOM and lids are normal. Pupils are equal, round, and reactive to light. No scleral icterus.  Neck: Trachea normal and normal range of motion. Neck supple.  Cardiovascular: Normal rate, regular rhythm and normal heart sounds.   Pulmonary/Chest: Effort normal and breath sounds normal. No respiratory distress. She has no wheezes. She has no rales.  Abdominal: Soft. Normal appearance. There is no tenderness. There is no rebound, no  guarding and no CVA tenderness.  Musculoskeletal: Normal range of motion.  Neurological: She is alert and oriented to person, place, and time. She has normal strength.  Skin: Skin is warm, dry and intact. No rash noted.  Psychiatric: She has a normal mood and affect. Her behavior is normal. Judgment and thought content normal.    ED Course  Procedures (including critical care time)  Results for orders placed during  the hospital encounter of 12/12/11  POCT I-STAT TROPONIN I      Component Value Range   Troponin i, poc 0.00  0.00 - 0.08 (ng/mL)   Comment 3              Date: 12/12/2011  Rate: 69  Rhythm: normal sinus rhythm  QRS Axis: normal  Intervals: normal  ST/T Wave abnormalities: normal  Conduction Disutrbances:none  Narrative Interpretation:   Old EKG Reviewed: unchanged from 06/04/2007      MDM  Patient with chest tightness that occurred earlier this morning.  It is not similar to when she had her past angina and MI.  She has no discomfort at this time.  She had no associated symptoms to indicate this is ACS.  Patient is a normal EKG and a normal troponin multiple hours after the initial incident I feel is unlikely to be an MI and she can be safely discharged home. Patient had no symptoms of pneumonia or other infection.  She has normal vital signs here.  She also has followup with her primary care physician.       Nat Christen, MD 12/12/11 [phone]

## 2011-12-12 NOTE — ED Notes (Signed)
Patient refuses to put gown on.  Also refused to have blood pressure cuff placed on her arm during monitoring.

## 2011-12-12 NOTE — ED Notes (Signed)
ekg was done in triage and signed off by triage physician.

## 2012-01-04 ENCOUNTER — Other Ambulatory Visit: Payer: Self-pay | Admitting: *Deleted

## 2012-01-04 DIAGNOSIS — E1139 Type 2 diabetes mellitus with other diabetic ophthalmic complication: Secondary | ICD-10-CM | POA: Diagnosis not present

## 2012-01-04 DIAGNOSIS — E11319 Type 2 diabetes mellitus with unspecified diabetic retinopathy without macular edema: Secondary | ICD-10-CM | POA: Diagnosis not present

## 2012-01-04 DIAGNOSIS — H2510 Age-related nuclear cataract, unspecified eye: Secondary | ICD-10-CM | POA: Diagnosis not present

## 2012-01-04 DIAGNOSIS — H40019 Open angle with borderline findings, low risk, unspecified eye: Secondary | ICD-10-CM | POA: Diagnosis not present

## 2012-01-09 DIAGNOSIS — L4050 Arthropathic psoriasis, unspecified: Secondary | ICD-10-CM | POA: Diagnosis not present

## 2012-01-17 ENCOUNTER — Ambulatory Visit (INDEPENDENT_AMBULATORY_CARE_PROVIDER_SITE_OTHER): Payer: Medicare Other

## 2012-01-17 DIAGNOSIS — I6529 Occlusion and stenosis of unspecified carotid artery: Secondary | ICD-10-CM

## 2012-01-18 DIAGNOSIS — L4050 Arthropathic psoriasis, unspecified: Secondary | ICD-10-CM | POA: Diagnosis not present

## 2012-01-18 DIAGNOSIS — M25569 Pain in unspecified knee: Secondary | ICD-10-CM | POA: Diagnosis not present

## 2012-01-18 DIAGNOSIS — L408 Other psoriasis: Secondary | ICD-10-CM | POA: Diagnosis not present

## 2012-01-23 DIAGNOSIS — L4050 Arthropathic psoriasis, unspecified: Secondary | ICD-10-CM | POA: Diagnosis not present

## 2012-01-25 ENCOUNTER — Encounter: Payer: Self-pay | Admitting: Vascular Surgery

## 2012-01-25 NOTE — Procedures (Unsigned)
CAROTID DUPLEX EXAM  INDICATION:  Follow up carotid arterial disease.  HISTORY: Diabetes:  Yes Cardiac:  MI in 2007, cardiac stent in 2008 Hypertension:  Yes Smoking:  No CV History:  Asymptomatic                                      RIGHT             LEFT Brachial systolic pressure:         130               140 Brachial Doppler waveforms:         triphasic         triphasic Vertebral direction of flow:        antegrade         antegrade DUPLEX VELOCITIES (cm/sec) CCA peak systolic                   142               116 ECA peak systolic                   139               121 ICA peak systolic                   103               93 ICA end diastolic                   39                40 PLAQUE MORPHOLOGY:                  soft              mixed PLAQUE AMOUNT:                      minimal           mild PLAQUE LOCATION:                    ICA               bifurcation/ICA  IMPRESSION: 1. 1% to 20% bilateral internal carotid artery stenosis. 2. Bilateral vertebral arteries are antegrade.  ___________________________________________ Larina Earthly, M.D.  SS/MEDQ  D:  01/17/2012  T:  01/17/2012  Job:  [id number]

## 2012-02-06 DIAGNOSIS — L4050 Arthropathic psoriasis, unspecified: Secondary | ICD-10-CM | POA: Diagnosis not present

## 2012-02-20 DIAGNOSIS — L4050 Arthropathic psoriasis, unspecified: Secondary | ICD-10-CM | POA: Diagnosis not present

## 2012-03-05 DIAGNOSIS — L4050 Arthropathic psoriasis, unspecified: Secondary | ICD-10-CM | POA: Diagnosis not present

## 2012-03-14 DIAGNOSIS — M810 Age-related osteoporosis without current pathological fracture: Secondary | ICD-10-CM | POA: Diagnosis not present

## 2012-03-14 DIAGNOSIS — R82998 Other abnormal findings in urine: Secondary | ICD-10-CM | POA: Diagnosis not present

## 2012-03-14 DIAGNOSIS — I10 Essential (primary) hypertension: Secondary | ICD-10-CM | POA: Diagnosis not present

## 2012-03-14 DIAGNOSIS — E785 Hyperlipidemia, unspecified: Secondary | ICD-10-CM | POA: Diagnosis not present

## 2012-03-14 DIAGNOSIS — E1129 Type 2 diabetes mellitus with other diabetic kidney complication: Secondary | ICD-10-CM | POA: Diagnosis not present

## 2012-03-19 DIAGNOSIS — L4050 Arthropathic psoriasis, unspecified: Secondary | ICD-10-CM | POA: Diagnosis not present

## 2012-03-21 ENCOUNTER — Other Ambulatory Visit: Payer: Self-pay | Admitting: Internal Medicine

## 2012-03-21 DIAGNOSIS — Z1231 Encounter for screening mammogram for malignant neoplasm of breast: Secondary | ICD-10-CM

## 2012-03-21 DIAGNOSIS — I10 Essential (primary) hypertension: Secondary | ICD-10-CM | POA: Diagnosis not present

## 2012-03-21 DIAGNOSIS — E1139 Type 2 diabetes mellitus with other diabetic ophthalmic complication: Secondary | ICD-10-CM | POA: Diagnosis not present

## 2012-03-21 DIAGNOSIS — E11319 Type 2 diabetes mellitus with unspecified diabetic retinopathy without macular edema: Secondary | ICD-10-CM | POA: Diagnosis not present

## 2012-03-21 DIAGNOSIS — Z0000 Encounter for general adult medical examination without abnormal findings: Secondary | ICD-10-CM | POA: Diagnosis not present

## 2012-03-25 ENCOUNTER — Other Ambulatory Visit: Payer: Self-pay | Admitting: Cardiovascular Disease

## 2012-04-02 DIAGNOSIS — L4050 Arthropathic psoriasis, unspecified: Secondary | ICD-10-CM | POA: Diagnosis not present

## 2012-04-15 ENCOUNTER — Ambulatory Visit (INDEPENDENT_AMBULATORY_CARE_PROVIDER_SITE_OTHER): Payer: Medicare Other | Admitting: Cardiovascular Disease

## 2012-04-15 ENCOUNTER — Encounter: Payer: Self-pay | Admitting: Cardiovascular Disease

## 2012-04-15 VITALS — BP 126/72 | HR 71 | Ht 61.0 in | Wt 193.0 lb

## 2012-04-15 DIAGNOSIS — M129 Arthropathy, unspecified: Secondary | ICD-10-CM | POA: Diagnosis not present

## 2012-04-15 MED ORDER — NITROGLYCERIN 0.4 MG SL SUBL: 0.4000 mg | SUBLINGUAL_TABLET | SUBLINGUAL | Status: DC | PRN

## 2012-04-15 MED ORDER — ROSUVASTATIN CALCIUM 10 MG PO TABS: 10.0000 mg | ORAL_TABLET | Freq: Every day | ORAL | Status: DC

## 2012-04-15 MED ORDER — CLOPIDOGREL BISULFATE 75 MG PO TABS: 75.0000 mg | ORAL_TABLET | Freq: Every day | ORAL | Status: DC

## 2012-04-15 MED ORDER — METOPROLOL SUCCINATE ER 100 MG PO TB24: 100.0000 mg | ORAL_TABLET | Freq: Every day | ORAL | Status: DC

## 2012-04-15 MED ORDER — HYDRALAZINE HCL 50 MG PO TABS: 50.0000 mg | ORAL_TABLET | Freq: Two times a day (BID) | ORAL | Status: DC

## 2012-04-15 MED ORDER — LOSARTAN POTASSIUM-HCTZ 100-25 MG PO TABS: 1.0000 | ORAL_TABLET | Freq: Every day | ORAL | Status: DC

## 2012-04-15 NOTE — Progress Notes (Signed)
Margaret Hobbs is seen today for F/U of elevated lipids, HTN and CAD she had her last cath in 05/2007 and had a patent stent to the RCA which was placed in January of 2008. She has not had any SSCP. Her BP has been under good controll. She sees Dr. Clelia Croft for blood work in a couple of weeks. She is on humera for psoriasis and would be considered immunocompromized. She already got a flue shot. She needs a refill on her meds through [facility] . She is a bit depressed again this am. She has gained some weight. She has had a previous CVA with normal carotids and has a hard time with some LLE weakness  Seeing Dr Manson Passey to fix front teeth  ROS: Denies fever, malais, weight loss, blurry vision, decreased visual acuity, cough, sputum, SOB, hemoptysis, pleuritic pain, palpitaitons, heartburn, abdominal pain, melena, lower extremity edema, claudication, or rash.  All other systems reviewed and negative  General: Affect appropriate Obese white female HEENT: normal Two front teeth chipped Neck supple with no adenopathy JVP normal no bruits no thyromegaly Lungs clear with no wheezing and good diaphragmatic motion Heart:  S1/S2 no murmur, no rub, gallop or click PMI normal Abdomen: benighn, BS positve, no tenderness, no AAA no bruit.  No HSM or HJR Distal pulses intact with no bruits No edema Neuro non-focal Skin warm and dry No muscular weakness   Current Outpatient Prescriptions  Medication Sig Dispense Refill  . adalimumab (HUMIRA) 40 MG/0.8ML injection Inject 40 mg into the skin every 14 (fourteen) days.        . Ascorbic Acid (VITAMIN C) 500 MG tablet Take 500 mg by mouth daily.        [name] aspirin 81 MG tablet Take 81 mg by mouth daily.        . calcium-vitamin D (OSCAL) 250-125 MG-UNIT per tablet Take 1 tablet by mouth daily.        . clopidogrel (PLAVIX) 75 MG tablet TAKE 1 TABLET DAILY  90 tablet  3  . co-enzyme Q-10 30 MG capsule Take 30 mg by mouth daily.       . CRESTOR 10 MG tablet TAKE 1 TABLET DAILY   90 tablet  3  . cyanocobalamin 1000 MCG tablet Take 1,000 mcg by mouth daily.       . folic acid (FOLVITE) 1 MG tablet Take 1 mg by mouth daily.        [name] glipiZIDE-metformin (METAGLIP) 5-500 MG per tablet Take 1 tablet by mouth 2 (two) times daily before a meal.        . hydrALAZINE (APRESOLINE) 50 MG tablet TAKE 1 TABLET TWICE A DAY  180 tablet  3  . losartan-hydrochlorothiazide (HYZAAR) 100-25 MG per tablet TAKE 1 TABLET DAILY  90 tablet  3  . metoprolol (TOPROL-XL) 100 MG 24 hr tablet Take 1 tablet (100 mg total) by mouth daily.  90 tablet  3  . NITROSTAT 0.4 MG SL tablet DISSOLVE 1 TABLET UNDER THETONGUE EVERY 5 MINUTES AS  NEEDED  75 each  3  . Risedronate Sodium (ATELVIA PO) Take 35 mg by mouth once a week. Monday      . vitamin A 16109 UNIT capsule Take 10,000 Units by mouth daily.        . vitamin E 400 UNIT capsule Take 400 Units by mouth daily.          Allergies  Remicade and Sulfa antibiotics  Electrocardiogram:  Assessment and Plan

## 2012-04-15 NOTE — Patient Instructions (Signed)
Your physician wants you to follow-up in: 6 months with Dr. Nishan. You will receive a reminder letter in the mail two months in advance. If you don't receive a letter, please call our office to schedule the follow-up appointment.  

## 2012-04-15 NOTE — Assessment & Plan Note (Signed)
Stable with no angina and good activity level.  Continue medical Rx  

## 2012-04-15 NOTE — Assessment & Plan Note (Signed)
Continue humera injections F/U primary for lab work and advised regarding flu shots

## 2012-04-15 NOTE — Assessment & Plan Note (Signed)
Well controlled.  Continue current medications and low sodium Dash type diet.    

## 2012-04-15 NOTE — Assessment & Plan Note (Signed)
Discussed low carb diet.  Target hemoglobin A1c is 6.5 or less.  Continue current medications.  

## 2012-04-15 NOTE — Assessment & Plan Note (Signed)
A low fat, low cholesterol is discussed with the patient, and a written copy is given to her. 

## 2012-04-16 DIAGNOSIS — L4050 Arthropathic psoriasis, unspecified: Secondary | ICD-10-CM | POA: Diagnosis not present

## 2012-04-24 ENCOUNTER — Ambulatory Visit
Admission: RE | Admit: 2012-04-24 | Discharge: 2012-04-24 | Disposition: A | Discharge status: Home / Self Care | Payer: Medicare Other | Source: Ambulatory Visit | Attending: Internal Medicine | Admitting: Internal Medicine

## 2012-04-24 ENCOUNTER — Other Ambulatory Visit: Payer: Self-pay

## 2012-04-24 DIAGNOSIS — Z1231 Encounter for screening mammogram for malignant neoplasm of breast: Secondary | ICD-10-CM | POA: Diagnosis not present

## 2012-04-29 DIAGNOSIS — H40019 Open angle with borderline findings, low risk, unspecified eye: Secondary | ICD-10-CM | POA: Diagnosis not present

## 2012-04-30 DIAGNOSIS — Z79899 Other long term (current) drug therapy: Secondary | ICD-10-CM | POA: Diagnosis not present

## 2012-04-30 DIAGNOSIS — L4050 Arthropathic psoriasis, unspecified: Secondary | ICD-10-CM | POA: Diagnosis not present

## 2012-05-14 ENCOUNTER — Other Ambulatory Visit: Payer: Self-pay | Admitting: Cardiovascular Disease

## 2012-05-14 DIAGNOSIS — L4050 Arthropathic psoriasis, unspecified: Secondary | ICD-10-CM | POA: Diagnosis not present

## 2012-05-21 ENCOUNTER — Other Ambulatory Visit: Payer: Self-pay | Admitting: *Deleted

## 2012-05-21 NOTE — Telephone Encounter (Signed)
Opened in Error.

## 2012-05-23 DIAGNOSIS — L408 Other psoriasis: Secondary | ICD-10-CM | POA: Diagnosis not present

## 2012-05-23 DIAGNOSIS — L4050 Arthropathic psoriasis, unspecified: Secondary | ICD-10-CM | POA: Diagnosis not present

## 2012-05-28 DIAGNOSIS — H6120 Impacted cerumen, unspecified ear: Secondary | ICD-10-CM | POA: Diagnosis not present

## 2012-05-28 DIAGNOSIS — H9190 Unspecified hearing loss, unspecified ear: Secondary | ICD-10-CM | POA: Diagnosis not present

## 2012-05-28 DIAGNOSIS — L4050 Arthropathic psoriasis, unspecified: Secondary | ICD-10-CM | POA: Diagnosis not present

## 2012-05-28 DIAGNOSIS — I10 Essential (primary) hypertension: Secondary | ICD-10-CM | POA: Diagnosis not present

## 2012-06-11 DIAGNOSIS — L4050 Arthropathic psoriasis, unspecified: Secondary | ICD-10-CM | POA: Diagnosis not present

## 2012-06-25 DIAGNOSIS — L4050 Arthropathic psoriasis, unspecified: Secondary | ICD-10-CM | POA: Diagnosis not present

## 2012-07-09 DIAGNOSIS — L4050 Arthropathic psoriasis, unspecified: Secondary | ICD-10-CM | POA: Diagnosis not present

## 2012-07-24 DIAGNOSIS — E1139 Type 2 diabetes mellitus with other diabetic ophthalmic complication: Secondary | ICD-10-CM | POA: Diagnosis not present

## 2012-07-24 DIAGNOSIS — I10 Essential (primary) hypertension: Secondary | ICD-10-CM | POA: Diagnosis not present

## 2012-07-24 DIAGNOSIS — E11319 Type 2 diabetes mellitus with unspecified diabetic retinopathy without macular edema: Secondary | ICD-10-CM | POA: Diagnosis not present

## 2012-07-24 DIAGNOSIS — E785 Hyperlipidemia, unspecified: Secondary | ICD-10-CM | POA: Diagnosis not present

## 2012-08-01 DIAGNOSIS — L4050 Arthropathic psoriasis, unspecified: Secondary | ICD-10-CM | POA: Diagnosis not present

## 2012-08-20 DIAGNOSIS — L4050 Arthropathic psoriasis, unspecified: Secondary | ICD-10-CM | POA: Diagnosis not present

## 2012-08-24 ENCOUNTER — Other Ambulatory Visit: Payer: Self-pay | Admitting: Cardiovascular Disease

## 2012-09-03 DIAGNOSIS — J019 Acute sinusitis, unspecified: Secondary | ICD-10-CM | POA: Diagnosis not present

## 2012-09-03 DIAGNOSIS — L4050 Arthropathic psoriasis, unspecified: Secondary | ICD-10-CM | POA: Diagnosis not present

## 2012-09-17 DIAGNOSIS — L4050 Arthropathic psoriasis, unspecified: Secondary | ICD-10-CM | POA: Diagnosis not present

## 2012-09-17 DIAGNOSIS — Z23 Encounter for immunization: Secondary | ICD-10-CM | POA: Diagnosis not present

## 2012-09-25 DIAGNOSIS — M25549 Pain in joints of unspecified hand: Secondary | ICD-10-CM | POA: Diagnosis not present

## 2012-09-25 DIAGNOSIS — L408 Other psoriasis: Secondary | ICD-10-CM | POA: Diagnosis not present

## 2012-09-25 DIAGNOSIS — M25569 Pain in unspecified knee: Secondary | ICD-10-CM | POA: Diagnosis not present

## 2012-09-25 DIAGNOSIS — L4050 Arthropathic psoriasis, unspecified: Secondary | ICD-10-CM | POA: Diagnosis not present

## 2012-10-01 DIAGNOSIS — L4050 Arthropathic psoriasis, unspecified: Secondary | ICD-10-CM | POA: Diagnosis not present

## 2012-10-02 DIAGNOSIS — M2430 Pathological dislocation of unspecified joint, not elsewhere classified: Secondary | ICD-10-CM | POA: Diagnosis not present

## 2012-10-02 DIAGNOSIS — M6281 Muscle weakness (generalized): Secondary | ICD-10-CM | POA: Diagnosis not present

## 2012-10-09 DIAGNOSIS — M6281 Muscle weakness (generalized): Secondary | ICD-10-CM | POA: Diagnosis not present

## 2012-10-09 DIAGNOSIS — M2430 Pathological dislocation of unspecified joint, not elsewhere classified: Secondary | ICD-10-CM | POA: Diagnosis not present

## 2012-10-15 DIAGNOSIS — L4050 Arthropathic psoriasis, unspecified: Secondary | ICD-10-CM | POA: Diagnosis not present

## 2012-10-16 DIAGNOSIS — M6281 Muscle weakness (generalized): Secondary | ICD-10-CM | POA: Diagnosis not present

## 2012-10-16 DIAGNOSIS — M2430 Pathological dislocation of unspecified joint, not elsewhere classified: Secondary | ICD-10-CM | POA: Diagnosis not present

## 2012-10-17 DIAGNOSIS — M6281 Muscle weakness (generalized): Secondary | ICD-10-CM | POA: Diagnosis not present

## 2012-10-17 DIAGNOSIS — M2430 Pathological dislocation of unspecified joint, not elsewhere classified: Secondary | ICD-10-CM | POA: Diagnosis not present

## 2012-10-22 DIAGNOSIS — M2430 Pathological dislocation of unspecified joint, not elsewhere classified: Secondary | ICD-10-CM | POA: Diagnosis not present

## 2012-10-22 DIAGNOSIS — M6281 Muscle weakness (generalized): Secondary | ICD-10-CM | POA: Diagnosis not present

## 2012-10-24 DIAGNOSIS — M6281 Muscle weakness (generalized): Secondary | ICD-10-CM | POA: Diagnosis not present

## 2012-10-24 DIAGNOSIS — M2430 Pathological dislocation of unspecified joint, not elsewhere classified: Secondary | ICD-10-CM | POA: Diagnosis not present

## 2012-10-29 DIAGNOSIS — M2430 Pathological dislocation of unspecified joint, not elsewhere classified: Secondary | ICD-10-CM | POA: Diagnosis not present

## 2012-10-29 DIAGNOSIS — L4050 Arthropathic psoriasis, unspecified: Secondary | ICD-10-CM | POA: Diagnosis not present

## 2012-10-29 DIAGNOSIS — M6281 Muscle weakness (generalized): Secondary | ICD-10-CM | POA: Diagnosis not present

## 2012-11-01 DIAGNOSIS — M6281 Muscle weakness (generalized): Secondary | ICD-10-CM | POA: Diagnosis not present

## 2012-11-01 DIAGNOSIS — M2430 Pathological dislocation of unspecified joint, not elsewhere classified: Secondary | ICD-10-CM | POA: Diagnosis not present

## 2012-11-05 DIAGNOSIS — M2430 Pathological dislocation of unspecified joint, not elsewhere classified: Secondary | ICD-10-CM | POA: Diagnosis not present

## 2012-11-05 DIAGNOSIS — M6281 Muscle weakness (generalized): Secondary | ICD-10-CM | POA: Diagnosis not present

## 2012-11-07 DIAGNOSIS — M6281 Muscle weakness (generalized): Secondary | ICD-10-CM | POA: Diagnosis not present

## 2012-11-07 DIAGNOSIS — M2430 Pathological dislocation of unspecified joint, not elsewhere classified: Secondary | ICD-10-CM | POA: Diagnosis not present

## 2012-11-12 DIAGNOSIS — L4050 Arthropathic psoriasis, unspecified: Secondary | ICD-10-CM | POA: Diagnosis not present

## 2012-11-13 DIAGNOSIS — M810 Age-related osteoporosis without current pathological fracture: Secondary | ICD-10-CM | POA: Diagnosis not present

## 2012-11-14 DIAGNOSIS — M2430 Pathological dislocation of unspecified joint, not elsewhere classified: Secondary | ICD-10-CM | POA: Diagnosis not present

## 2012-11-14 DIAGNOSIS — M6281 Muscle weakness (generalized): Secondary | ICD-10-CM | POA: Diagnosis not present

## 2012-11-19 DIAGNOSIS — M2430 Pathological dislocation of unspecified joint, not elsewhere classified: Secondary | ICD-10-CM | POA: Diagnosis not present

## 2012-11-19 DIAGNOSIS — M6281 Muscle weakness (generalized): Secondary | ICD-10-CM | POA: Diagnosis not present

## 2012-11-25 DIAGNOSIS — E1139 Type 2 diabetes mellitus with other diabetic ophthalmic complication: Secondary | ICD-10-CM | POA: Diagnosis not present

## 2012-11-25 DIAGNOSIS — N1831 Chronic kidney disease, stage 3a: Secondary | ICD-10-CM | POA: Insufficient documentation

## 2012-11-25 DIAGNOSIS — E11319 Type 2 diabetes mellitus with unspecified diabetic retinopathy without macular edema: Secondary | ICD-10-CM | POA: Diagnosis not present

## 2012-11-25 DIAGNOSIS — I2510 Atherosclerotic heart disease of native coronary artery without angina pectoris: Secondary | ICD-10-CM | POA: Diagnosis not present

## 2012-11-26 DIAGNOSIS — L4050 Arthropathic psoriasis, unspecified: Secondary | ICD-10-CM | POA: Diagnosis not present

## 2012-11-26 DIAGNOSIS — M6281 Muscle weakness (generalized): Secondary | ICD-10-CM | POA: Diagnosis not present

## 2012-11-26 DIAGNOSIS — M2430 Pathological dislocation of unspecified joint, not elsewhere classified: Secondary | ICD-10-CM | POA: Diagnosis not present

## 2012-11-27 ENCOUNTER — Ambulatory Visit (INDEPENDENT_AMBULATORY_CARE_PROVIDER_SITE_OTHER): Payer: Medicare Other | Admitting: *Deleted

## 2012-11-27 DIAGNOSIS — M79609 Pain in unspecified limb: Secondary | ICD-10-CM

## 2012-11-27 DIAGNOSIS — M79673 Pain in unspecified foot: Secondary | ICD-10-CM

## 2012-12-03 DIAGNOSIS — M6281 Muscle weakness (generalized): Secondary | ICD-10-CM | POA: Diagnosis not present

## 2012-12-03 DIAGNOSIS — M2430 Pathological dislocation of unspecified joint, not elsewhere classified: Secondary | ICD-10-CM | POA: Diagnosis not present

## 2012-12-05 DIAGNOSIS — M6281 Muscle weakness (generalized): Secondary | ICD-10-CM | POA: Diagnosis not present

## 2012-12-05 DIAGNOSIS — M2430 Pathological dislocation of unspecified joint, not elsewhere classified: Secondary | ICD-10-CM | POA: Diagnosis not present

## 2012-12-10 DIAGNOSIS — L4050 Arthropathic psoriasis, unspecified: Secondary | ICD-10-CM | POA: Diagnosis not present

## 2013-01-07 DIAGNOSIS — L4050 Arthropathic psoriasis, unspecified: Secondary | ICD-10-CM | POA: Diagnosis not present

## 2013-01-21 ENCOUNTER — Ambulatory Visit: Payer: Medicare Other | Admitting: Vascular Surgery

## 2013-01-21 ENCOUNTER — Other Ambulatory Visit: Payer: Medicare Other

## 2013-01-21 DIAGNOSIS — L4050 Arthropathic psoriasis, unspecified: Secondary | ICD-10-CM | POA: Diagnosis not present

## 2013-02-04 DIAGNOSIS — L4050 Arthropathic psoriasis, unspecified: Secondary | ICD-10-CM | POA: Diagnosis not present

## 2013-02-18 DIAGNOSIS — Z79899 Other long term (current) drug therapy: Secondary | ICD-10-CM | POA: Diagnosis not present

## 2013-03-04 DIAGNOSIS — L4050 Arthropathic psoriasis, unspecified: Secondary | ICD-10-CM | POA: Diagnosis not present

## 2013-03-06 DIAGNOSIS — M25569 Pain in unspecified knee: Secondary | ICD-10-CM | POA: Diagnosis not present

## 2013-03-18 DIAGNOSIS — L4050 Arthropathic psoriasis, unspecified: Secondary | ICD-10-CM | POA: Diagnosis not present

## 2013-03-27 DIAGNOSIS — I2510 Atherosclerotic heart disease of native coronary artery without angina pectoris: Secondary | ICD-10-CM | POA: Diagnosis not present

## 2013-03-27 DIAGNOSIS — I10 Essential (primary) hypertension: Secondary | ICD-10-CM | POA: Diagnosis not present

## 2013-03-27 DIAGNOSIS — M810 Age-related osteoporosis without current pathological fracture: Secondary | ICD-10-CM | POA: Diagnosis not present

## 2013-03-27 DIAGNOSIS — E785 Hyperlipidemia, unspecified: Secondary | ICD-10-CM | POA: Diagnosis not present

## 2013-03-27 DIAGNOSIS — E1139 Type 2 diabetes mellitus with other diabetic ophthalmic complication: Secondary | ICD-10-CM | POA: Diagnosis not present

## 2013-04-01 DIAGNOSIS — Z79899 Other long term (current) drug therapy: Secondary | ICD-10-CM | POA: Diagnosis not present

## 2013-04-01 DIAGNOSIS — L4050 Arthropathic psoriasis, unspecified: Secondary | ICD-10-CM | POA: Diagnosis not present

## 2013-04-04 DIAGNOSIS — L4050 Arthropathic psoriasis, unspecified: Secondary | ICD-10-CM | POA: Diagnosis not present

## 2013-04-04 DIAGNOSIS — E1139 Type 2 diabetes mellitus with other diabetic ophthalmic complication: Secondary | ICD-10-CM | POA: Diagnosis not present

## 2013-04-04 DIAGNOSIS — I10 Essential (primary) hypertension: Secondary | ICD-10-CM | POA: Diagnosis not present

## 2013-04-04 DIAGNOSIS — Z0000 Encounter for general adult medical examination without abnormal findings: Secondary | ICD-10-CM | POA: Diagnosis not present

## 2013-04-04 DIAGNOSIS — E785 Hyperlipidemia, unspecified: Secondary | ICD-10-CM | POA: Diagnosis not present

## 2013-04-04 DIAGNOSIS — E11319 Type 2 diabetes mellitus with unspecified diabetic retinopathy without macular edema: Secondary | ICD-10-CM | POA: Diagnosis not present

## 2013-04-04 DIAGNOSIS — Z6833 Body mass index (BMI) 33.0-33.9, adult: Secondary | ICD-10-CM | POA: Diagnosis not present

## 2013-04-04 DIAGNOSIS — H6120 Impacted cerumen, unspecified ear: Secondary | ICD-10-CM | POA: Diagnosis not present

## 2013-04-10 DIAGNOSIS — E119 Type 2 diabetes mellitus without complications: Secondary | ICD-10-CM | POA: Diagnosis not present

## 2013-04-10 DIAGNOSIS — E11319 Type 2 diabetes mellitus with unspecified diabetic retinopathy without macular edema: Secondary | ICD-10-CM | POA: Diagnosis not present

## 2013-04-10 DIAGNOSIS — H40019 Open angle with borderline findings, low risk, unspecified eye: Secondary | ICD-10-CM | POA: Diagnosis not present

## 2013-04-10 DIAGNOSIS — H524 Presbyopia: Secondary | ICD-10-CM | POA: Diagnosis not present

## 2013-04-10 DIAGNOSIS — H35039 Hypertensive retinopathy, unspecified eye: Secondary | ICD-10-CM | POA: Diagnosis not present

## 2013-04-10 DIAGNOSIS — H2510 Age-related nuclear cataract, unspecified eye: Secondary | ICD-10-CM | POA: Diagnosis not present

## 2013-04-15 DIAGNOSIS — L4050 Arthropathic psoriasis, unspecified: Secondary | ICD-10-CM | POA: Diagnosis not present

## 2013-04-29 DIAGNOSIS — L4050 Arthropathic psoriasis, unspecified: Secondary | ICD-10-CM | POA: Diagnosis not present

## 2013-05-13 DIAGNOSIS — L4050 Arthropathic psoriasis, unspecified: Secondary | ICD-10-CM | POA: Diagnosis not present

## 2013-05-27 DIAGNOSIS — L4050 Arthropathic psoriasis, unspecified: Secondary | ICD-10-CM | POA: Diagnosis not present

## 2013-06-10 DIAGNOSIS — L4050 Arthropathic psoriasis, unspecified: Secondary | ICD-10-CM | POA: Diagnosis not present

## 2013-06-10 DIAGNOSIS — Z79899 Other long term (current) drug therapy: Secondary | ICD-10-CM | POA: Diagnosis not present

## 2013-06-10 DIAGNOSIS — M810 Age-related osteoporosis without current pathological fracture: Secondary | ICD-10-CM | POA: Diagnosis not present

## 2013-07-08 DIAGNOSIS — L4050 Arthropathic psoriasis, unspecified: Secondary | ICD-10-CM | POA: Diagnosis not present

## 2013-07-22 DIAGNOSIS — L4050 Arthropathic psoriasis, unspecified: Secondary | ICD-10-CM | POA: Diagnosis not present

## 2013-07-31 DIAGNOSIS — H40019 Open angle with borderline findings, low risk, unspecified eye: Secondary | ICD-10-CM | POA: Diagnosis not present

## 2013-08-05 DIAGNOSIS — Z6832 Body mass index (BMI) 32.0-32.9, adult: Secondary | ICD-10-CM | POA: Diagnosis not present

## 2013-08-05 DIAGNOSIS — L4050 Arthropathic psoriasis, unspecified: Secondary | ICD-10-CM | POA: Diagnosis not present

## 2013-08-05 DIAGNOSIS — R52 Pain, unspecified: Secondary | ICD-10-CM | POA: Diagnosis not present

## 2013-08-07 DIAGNOSIS — L4050 Arthropathic psoriasis, unspecified: Secondary | ICD-10-CM | POA: Diagnosis not present

## 2013-08-07 DIAGNOSIS — M545 Low back pain: Secondary | ICD-10-CM | POA: Diagnosis not present

## 2013-08-07 DIAGNOSIS — Z09 Encounter for follow-up examination after completed treatment for conditions other than malignant neoplasm: Secondary | ICD-10-CM | POA: Diagnosis not present

## 2013-08-07 DIAGNOSIS — L408 Other psoriasis: Secondary | ICD-10-CM | POA: Diagnosis not present

## 2013-08-15 DIAGNOSIS — E11319 Type 2 diabetes mellitus with unspecified diabetic retinopathy without macular edema: Secondary | ICD-10-CM | POA: Diagnosis not present

## 2013-08-15 DIAGNOSIS — E785 Hyperlipidemia, unspecified: Secondary | ICD-10-CM | POA: Diagnosis not present

## 2013-08-15 DIAGNOSIS — M810 Age-related osteoporosis without current pathological fracture: Secondary | ICD-10-CM | POA: Diagnosis not present

## 2013-08-15 DIAGNOSIS — I2510 Atherosclerotic heart disease of native coronary artery without angina pectoris: Secondary | ICD-10-CM | POA: Diagnosis not present

## 2013-08-15 DIAGNOSIS — I10 Essential (primary) hypertension: Secondary | ICD-10-CM | POA: Diagnosis not present

## 2013-08-15 DIAGNOSIS — E1139 Type 2 diabetes mellitus with other diabetic ophthalmic complication: Secondary | ICD-10-CM | POA: Diagnosis not present

## 2013-08-15 DIAGNOSIS — L4050 Arthropathic psoriasis, unspecified: Secondary | ICD-10-CM | POA: Diagnosis not present

## 2013-08-19 DIAGNOSIS — L4050 Arthropathic psoriasis, unspecified: Secondary | ICD-10-CM | POA: Diagnosis not present

## 2013-09-02 DIAGNOSIS — Z79899 Other long term (current) drug therapy: Secondary | ICD-10-CM | POA: Diagnosis not present

## 2013-09-02 DIAGNOSIS — M810 Age-related osteoporosis without current pathological fracture: Secondary | ICD-10-CM | POA: Diagnosis not present

## 2013-09-02 DIAGNOSIS — L4050 Arthropathic psoriasis, unspecified: Secondary | ICD-10-CM | POA: Diagnosis not present

## 2013-09-16 DIAGNOSIS — L4050 Arthropathic psoriasis, unspecified: Secondary | ICD-10-CM | POA: Diagnosis not present

## 2013-09-30 DIAGNOSIS — L4050 Arthropathic psoriasis, unspecified: Secondary | ICD-10-CM | POA: Diagnosis not present

## 2013-09-30 DIAGNOSIS — Z23 Encounter for immunization: Secondary | ICD-10-CM | POA: Diagnosis not present

## 2013-10-01 ENCOUNTER — Ambulatory Visit (INDEPENDENT_AMBULATORY_CARE_PROVIDER_SITE_OTHER): Payer: Medicare Other | Admitting: Cardiovascular Disease

## 2013-10-01 ENCOUNTER — Encounter: Payer: Self-pay | Admitting: Cardiovascular Disease

## 2013-10-01 VITALS — BP 130/80 | HR 66 | Resp 12 | Ht 64.0 in | Wt 192.6 lb

## 2013-10-01 DIAGNOSIS — E785 Hyperlipidemia, unspecified: Secondary | ICD-10-CM

## 2013-10-01 DIAGNOSIS — I10 Essential (primary) hypertension: Secondary | ICD-10-CM

## 2013-10-01 DIAGNOSIS — I6350 Cerebral infarction due to unspecified occlusion or stenosis of unspecified cerebral artery: Secondary | ICD-10-CM

## 2013-10-01 DIAGNOSIS — E119 Type 2 diabetes mellitus without complications: Secondary | ICD-10-CM

## 2013-10-01 DIAGNOSIS — I2510 Atherosclerotic heart disease of native coronary artery without angina pectoris: Secondary | ICD-10-CM

## 2013-10-01 MED ORDER — METOPROLOL SUCCINATE ER 100 MG PO TB24: 100.0000 mg | ORAL_TABLET | Freq: Every day | ORAL | Status: DC

## 2013-10-01 MED ORDER — HYDRALAZINE HCL 50 MG PO TABS: 50.0000 mg | ORAL_TABLET | Freq: Two times a day (BID) | ORAL | Status: DC

## 2013-10-01 MED ORDER — ROSUVASTATIN CALCIUM 10 MG PO TABS: 10.0000 mg | ORAL_TABLET | Freq: Every day | ORAL | Status: DC

## 2013-10-01 MED ORDER — LOSARTAN POTASSIUM-HCTZ 100-25 MG PO TABS: 1.0000 | ORAL_TABLET | Freq: Every day | ORAL | Status: DC

## 2013-10-01 NOTE — Addendum Note (Signed)
Addended by: Scherrie Bateman E on: 10/01/2013 03:02 PM   Modules accepted: Orders

## 2013-10-01 NOTE — Assessment & Plan Note (Signed)
Stable with no angina and good activity level.  Continue medical Rx  

## 2013-10-01 NOTE — Assessment & Plan Note (Signed)
Well controlled.  Continue current medications and low sodium Dash type diet.    

## 2013-10-01 NOTE — Progress Notes (Signed)
Patient ID: Margaret Hobbs, female   DOB: [birth year], 62 y.o.   MRN: [id number] Margaret Hobbs is seen today for F/U of elevated lipids, HTN and CAD she had her last cath in 05/2007 and had a patent stent to the RCA which was placed in January of 2008. She has not had any SSCP. Her BP has been under good controll. She sees Dr. Clelia Hobbs for blood work in a couple of weeks. She is on humera for psoriasis and would be considered immunocompromized. She already got a flue shot. She needs a refill on her meds through [facility] . She is a bit depressed again this am. She has gained some weight. She has had a previous CVA with normal carotids and has a hard time with some LLE weakness  Seeing Dr Margaret Hobbs to fix front teeth   ROS: Denies fever, malais, weight loss, blurry vision, decreased visual acuity, cough, sputum, SOB, hemoptysis, pleuritic pain, palpitaitons, heartburn, abdominal pain, melena, lower extremity edema, claudication, or rash.  All other systems reviewed and negative  General: Affect appropriate Healthy:  appears stated age HEENT: normal Neck supple with no adenopathy JVP normal no bruits no thyromegaly Lungs clear with no wheezing and good diaphragmatic motion Heart:  S1/S2 no murmur, no rub, gallop or click PMI normal Abdomen: benighn, BS positve, no tenderness, no AAA no bruit.  No HSM or HJR Distal pulses intact with no bruits No edema Neuro non-focal Skin warm and dry No muscular weakness   Current Outpatient Prescriptions  Medication Sig Dispense Refill  . adalimumab (HUMIRA) 40 MG/0.8ML injection Inject 40 mg into the skin every 14 (fourteen) days.        . Ascorbic Acid (VITAMIN C) 500 MG tablet Take 500 mg by mouth daily.        [name] aspirin 81 MG tablet Take 81 mg by mouth daily.        . calcium-vitamin D (OSCAL) 250-125 MG-UNIT per tablet Take 1 tablet by mouth daily.        . clopidogrel (PLAVIX) 75 MG tablet Take 1 tablet (75 mg total) by mouth daily.  90 tablet  3  . co-enzyme  Q-10 30 MG capsule Take 30 mg by mouth daily.       . cyanocobalamin 1000 MCG tablet Take 1,000 mcg by mouth daily.       . folic acid (FOLVITE) 1 MG tablet Take 1 mg by mouth daily.        [name] glipiZIDE-metformin (METAGLIP) 5-500 MG per tablet Take 1 tablet by mouth 2 (two) times daily before a meal.        . hydrALAZINE (APRESOLINE) 50 MG tablet Take 1 tablet (50 mg total) by mouth 2 (two) times daily.  180 tablet  3  . losartan-hydrochlorothiazide (HYZAAR) 100-25 MG per tablet Take 1 tablet by mouth daily.  90 tablet  3  . metoprolol succinate (TOPROL-XL) 100 MG 24 hr tablet Take 1 tablet (100 mg total) by mouth daily.  90 tablet  3  . nitroGLYCERIN (NITROSTAT) 0.4 MG SL tablet Place 1 tablet (0.4 mg total) under the tongue every 5 (five) minutes as needed for chest pain.  25 tablet  3  . NITROSTAT 0.4 MG SL tablet DISSOLVE ONE TABLET UNDER  THE TONGUE EVERY 5 MINUTES AS NEEDED  25 each  12  . Risedronate Sodium (ATELVIA PO) Take 35 mg by mouth once a week. Monday      . rosuvastatin (CRESTOR) 10 MG tablet Take 1 tablet (10  mg total) by mouth daily.  90 tablet  3  . vitamin A 09811 UNIT capsule Take 10,000 Units by mouth daily.        . vitamin E 400 UNIT capsule Take 400 Units by mouth daily.         No current facility-administered medications for this visit.    Allergies  Remicade and Sulfa antibiotics  Electrocardiogram:  SR rate 66 normal   Assessment and Plan

## 2013-10-01 NOTE — Patient Instructions (Signed)
Your physician wants you to follow-up in:   6 months with  DR  Haywood Filler will receive a reminder letter in the mail two months in advance. If you don't receive a letter, please call our office to schedule the follow-up appointment. Your physician recommends that you continue on your current medications as directed. Please refer to the Current Medication list given to you today.

## 2013-10-01 NOTE — Assessment & Plan Note (Signed)
Cholesterol is at goal.  Continue current dose of statin and diet Rx.  No myalgias or side effects.  F/U  LFT's in 6 months. Lab Results  Component Value Date   [facility]  Value: 57        Total Cholesterol/HDL:CHD Risk Coronary Heart Disease Risk Table                     Men   Women  1/2 Average Risk   3.4   3.3 06/03/2007

## 2013-10-01 NOTE — Assessment & Plan Note (Signed)
Continue ASA and Plavix  Small vessel CNS disease

## 2013-10-01 NOTE — Assessment & Plan Note (Signed)
Discussed low carb diet.  Target hemoglobin A1c is 6.5 or less.  Continue current medications.  

## 2013-10-14 DIAGNOSIS — L4050 Arthropathic psoriasis, unspecified: Secondary | ICD-10-CM | POA: Diagnosis not present

## 2013-10-14 DIAGNOSIS — Z6832 Body mass index (BMI) 32.0-32.9, adult: Secondary | ICD-10-CM | POA: Diagnosis not present

## 2013-12-23 DIAGNOSIS — L4050 Arthropathic psoriasis, unspecified: Secondary | ICD-10-CM | POA: Diagnosis not present

## 2013-12-24 DIAGNOSIS — M810 Age-related osteoporosis without current pathological fracture: Secondary | ICD-10-CM | POA: Diagnosis not present

## 2013-12-24 DIAGNOSIS — L4050 Arthropathic psoriasis, unspecified: Secondary | ICD-10-CM | POA: Diagnosis not present

## 2013-12-24 DIAGNOSIS — E11319 Type 2 diabetes mellitus with unspecified diabetic retinopathy without macular edema: Secondary | ICD-10-CM | POA: Diagnosis not present

## 2013-12-24 DIAGNOSIS — E785 Hyperlipidemia, unspecified: Secondary | ICD-10-CM | POA: Diagnosis not present

## 2013-12-24 DIAGNOSIS — E1139 Type 2 diabetes mellitus with other diabetic ophthalmic complication: Secondary | ICD-10-CM | POA: Diagnosis not present

## 2013-12-24 DIAGNOSIS — I10 Essential (primary) hypertension: Secondary | ICD-10-CM | POA: Diagnosis not present

## 2013-12-24 DIAGNOSIS — I69959 Hemiplegia and hemiparesis following unspecified cerebrovascular disease affecting unspecified side: Secondary | ICD-10-CM | POA: Diagnosis not present

## 2013-12-26 ENCOUNTER — Other Ambulatory Visit: Payer: Self-pay | Admitting: Vascular Surgery

## 2013-12-26 DIAGNOSIS — I639 Cerebral infarction, unspecified: Secondary | ICD-10-CM

## 2013-12-29 ENCOUNTER — Other Ambulatory Visit: Payer: Self-pay | Admitting: Cardiovascular Disease

## 2014-01-05 ENCOUNTER — Telehealth: Payer: Self-pay | Admitting: Cardiovascular Disease

## 2014-01-05 NOTE — Telephone Encounter (Signed)
New mssage     [facility].  No ins change.   Need insurance approval

## 2014-01-06 ENCOUNTER — Inpatient Hospital Stay (HOSPITAL_COMMUNITY): Admission: RE | Admit: 2014-01-06 | Payer: Medicare Other | Source: Ambulatory Visit

## 2014-01-06 ENCOUNTER — Ambulatory Visit: Payer: Medicare Other | Admitting: Vascular Surgery

## 2014-01-06 DIAGNOSIS — L4050 Arthropathic psoriasis, unspecified: Secondary | ICD-10-CM | POA: Diagnosis not present

## 2014-01-06 DIAGNOSIS — Z6831 Body mass index (BMI) 31.0-31.9, adult: Secondary | ICD-10-CM | POA: Diagnosis not present

## 2014-01-07 ENCOUNTER — Encounter: Payer: Self-pay | Admitting: Family

## 2014-01-08 ENCOUNTER — Ambulatory Visit (INDEPENDENT_AMBULATORY_CARE_PROVIDER_SITE_OTHER): Payer: Medicare Other | Admitting: Family

## 2014-01-08 ENCOUNTER — Encounter: Payer: Self-pay | Admitting: Family

## 2014-01-08 ENCOUNTER — Ambulatory Visit (HOSPITAL_COMMUNITY)
Admission: RE | Admit: 2014-01-08 | Discharge: 2014-01-08 | Disposition: A | Discharge status: Home / Self Care | Payer: Medicare Other | Source: Ambulatory Visit | Attending: Vascular Surgery | Admitting: Vascular Surgery

## 2014-01-08 VITALS — BP 128/67 | HR 66 | Resp 16 | Ht 61.5 in | Wt 192.0 lb

## 2014-01-08 DIAGNOSIS — I658 Occlusion and stenosis of other precerebral arteries: Secondary | ICD-10-CM | POA: Insufficient documentation

## 2014-01-08 DIAGNOSIS — I6529 Occlusion and stenosis of unspecified carotid artery: Secondary | ICD-10-CM

## 2014-01-08 DIAGNOSIS — I639 Cerebral infarction, unspecified: Secondary | ICD-10-CM

## 2014-01-08 DIAGNOSIS — I6350 Cerebral infarction due to unspecified occlusion or stenosis of unspecified cerebral artery: Secondary | ICD-10-CM

## 2014-01-08 NOTE — Patient Instructions (Signed)

## 2014-01-08 NOTE — Progress Notes (Signed)
Established Carotid Patient   History of Present Illness  Margaret Hobbs is a 63 y.o. female patient that Dr. Arbie Cookey has been following for known carotid stenosis. She returns today for follow up. The last carotid Duplex on file was from 01/17/2012. January 2008 she had an MI, had a stent placed, June, 2008 had a stroke as manifested by right hemiplegia and slurred speech; denies monocular blindness. Her only residual neurological deficits are right leg weakness.   Pt denies any further stroke or TIA symptoms since 2008. Pt denies any steal symptoms in arms/hands.  Patient has not had previous carotid artery intervention.   Patient denies New Medical or Surgical History. She denies claudication symptoms, denies non-healing wounds.  Pt Diabetic: Yes, states her last A1C was 5.3%, which is normal Pt smoker: non-smoker  Pt meds include: Statin : Yes Betablocker: Yes ASA: Yes Other anticoagulants/antiplatelets: Plavix   Past Medical History  Diagnosis Date  . TIA (transient ischemic attack)   . UTI (urinary tract infection)   . HLD (hyperlipidemia)   . Cerebrovascular disease   . Aphasia   . DM2 (diabetes mellitus, type 2)   . CVA (cerebral vascular accident)   . Hyperparathyroidism   . Arthritis   . HTN (hypertension)   . Hypercholesteremia   . MI (myocardial infarction)   . CAD (coronary artery disease)   . Depressed   . Diabetes mellitus     Social History History  Substance Use Topics  . Smoking status: Never Smoker   . Smokeless tobacco: Not on file  . Alcohol Use: No    Family History Family History  Problem Relation Age of Onset  . Diabetes    . Hypertension    . Alcohol abuse    . Coronary artery disease      Surgical History Past Surgical History  Procedure Laterality Date  . Hysterectomy - unknown type      Allergies  Allergen Reactions  . Remicade [Infliximab]   . Sulfa Antibiotics Rash    Current Outpatient Prescriptions   Medication Sig Dispense Refill  . adalimumab (HUMIRA) 40 MG/0.8ML injection Inject 40 mg into the skin every 14 (fourteen) days.        . Ascorbic Acid (VITAMIN C) 500 MG tablet Take 500 mg by mouth daily.        [name] aspirin 81 MG tablet Take 81 mg by mouth daily.        . calcium-vitamin D (OSCAL) 250-125 MG-UNIT per tablet Take 1 tablet by mouth daily.        . clopidogrel (PLAVIX) 75 MG tablet Take 1 tablet (75 mg total) by mouth daily.  90 tablet  3  . co-enzyme Q-10 30 MG capsule Take 30 mg by mouth daily.       . cyanocobalamin 1000 MCG tablet Take 1,000 mcg by mouth daily.       . folic acid (FOLVITE) 1 MG tablet Take 1 mg by mouth daily.        [name] glipiZIDE-metformin (METAGLIP) 5-500 MG per tablet Take 1 tablet by mouth 2 (two) times daily before a meal.        . hydrALAZINE (APRESOLINE) 50 MG tablet Take 1 tablet (50 mg total) by mouth 2 (two) times daily.  180 tablet  3  . losartan-hydrochlorothiazide (HYZAAR) 100-25 MG per tablet Take 1 tablet by mouth daily.  90 tablet  3  . metoprolol succinate (TOPROL-XL) 100 MG 24 hr tablet Take 1 tablet (100 mg total)  by mouth daily.  90 tablet  3  . nitroGLYCERIN (NITROSTAT) 0.4 MG SL tablet Place 1 tablet (0.4 mg total) under the tongue every 5 (five) minutes as needed for chest pain.  25 tablet  3  . NITROSTAT 0.4 MG SL tablet DISSOLVE ONE TABLET UNDER  THE TONGUE EVERY 5 MINUTES AS NEEDED  25 each  12  . Risedronate Sodium (ATELVIA PO) Take 35 mg by mouth once a week. Monday      . rosuvastatin (CRESTOR) 10 MG tablet Take 1 tablet (10 mg total) by mouth daily.  90 tablet  3  . vitamin A 1610910000 UNIT capsule Take 10,000 Units by mouth daily.        . vitamin E 400 UNIT capsule Take 400 Units by mouth daily.         No current facility-administered medications for this visit.    Review of Systems : See HPI for pertinent positives and negatives.  Physical Examination  Filed Vitals:   01/08/14 1025  BP: 128/67  Pulse: 66  Resp: 16    Filed Weights   01/08/14 1025  Weight: 192 lb (87.091 kg)   Body mass index is 35.7 kg/(m^2).  General: WDWN obese female in NAD GAIT: mild limp Eyes: PERRLA Pulmonary:  CTAB, Negative  Rales, Negative rhonchi, & Negative wheezing.  Cardiac: regular Rhythm ,  Negative detected Murmurs.  VASCULAR EXAM Carotid Bruits Left Right   Positive Negative    Aorta is not palpable. Radial pulses are 2+ palpable and equal.                                                                                                                            LE Pulses LEFT RIGHT       POPLITEAL  not palpable   not palpable       POSTERIOR TIBIAL   palpable    palpable        DORSALIS PEDIS      ANTERIOR TIBIAL not palpable  not palpable     Gastrointestinal: soft, nontender, BS WNL, no r/g,  negative masses.  Musculoskeletal: Negative muscle atrophy/wasting. M/S 5/5 in UE's, 3/5 in LLE, 2/5 in RLE, Extremities without ischemic changes.  Neurologic: A&O X 3; Appropriate Affect ; SENSATION ;normal;  Speech is normal CN 2-12 intact, Pain and light touch intact in extremities, Motor exam as listed above.   Non-Invasive Vascular Imaging CAROTID DUPLEX 01/08/2014   Right ICA: <40% stenosis. Left ICA: 40 - 59 % stenosis.  Previous carotid studies (01/17/2012) demonstrated: 1. 1% to 20% bilateral internal carotid artery stenosis.  2. Bilateral vertebral arteries are antegrade.  Slight increase in stenosis of LICA form previous exam.  Assessment: Margaret Hobbs is a 63 y.o. female who presents with asymptomatic minimal right ICA stenosis and mild/moderate left ICA stenosis. The left ICA stenosis is slightly worse from previous exam. Brachial pressures are equal.  Plan: Follow-up in 1 year with Carotid Duplex  scan.   I discussed in depth with the patient the nature of atherosclerosis, and emphasized the importance of maximal medical management including strict control of blood  pressure, blood glucose, and lipid levels, obtaining regular exercise, and continued cessation of smoking.  The patient is aware that without maximal medical management the underlying atherosclerotic disease process will progress, limiting the benefit of any interventions.  The patient was given information about stroke prevention and what symptoms should prompt the patient to seek immediate medical care.  Thank you for allowing [place] to participate in this patient's care.  Charisse March, RN, MSN, FNP-C [facility] Office: [phone]  Clinic Physician: Darrick Penna  01/08/2014 9:32 AM

## 2014-01-13 ENCOUNTER — Ambulatory Visit: Payer: Medicare Other | Admitting: Vascular Surgery

## 2014-01-13 ENCOUNTER — Other Ambulatory Visit (HOSPITAL_COMMUNITY): Payer: Medicare Other

## 2014-01-14 DIAGNOSIS — M1710 Unilateral primary osteoarthritis, unspecified knee: Secondary | ICD-10-CM | POA: Diagnosis not present

## 2014-01-14 DIAGNOSIS — L4050 Arthropathic psoriasis, unspecified: Secondary | ICD-10-CM | POA: Diagnosis not present

## 2014-01-14 DIAGNOSIS — M545 Low back pain, unspecified: Secondary | ICD-10-CM | POA: Diagnosis not present

## 2014-01-14 DIAGNOSIS — L408 Other psoriasis: Secondary | ICD-10-CM | POA: Diagnosis not present

## 2014-01-19 NOTE — Telephone Encounter (Signed)
[facility] states no problem with [place], they shipped 90 day supply on 01/06/2014, 2 more refills available.

## 2014-01-19 NOTE — Addendum Note (Signed)
Addended by: Carmela HurtADAMS, Deztinee Lohmeyer G on: 01/19/2014 02:38 PM   Modules accepted: Kipp BroodSmartSet

## 2014-01-20 DIAGNOSIS — L4050 Arthropathic psoriasis, unspecified: Secondary | ICD-10-CM | POA: Diagnosis not present

## 2014-02-03 DIAGNOSIS — Z6831 Body mass index (BMI) 31.0-31.9, adult: Secondary | ICD-10-CM | POA: Diagnosis not present

## 2014-02-03 DIAGNOSIS — L4050 Arthropathic psoriasis, unspecified: Secondary | ICD-10-CM | POA: Diagnosis not present

## 2014-02-17 DIAGNOSIS — L4050 Arthropathic psoriasis, unspecified: Secondary | ICD-10-CM | POA: Diagnosis not present

## 2014-03-03 DIAGNOSIS — M810 Age-related osteoporosis without current pathological fracture: Secondary | ICD-10-CM | POA: Diagnosis not present

## 2014-03-03 DIAGNOSIS — Z79899 Other long term (current) drug therapy: Secondary | ICD-10-CM | POA: Diagnosis not present

## 2014-03-03 DIAGNOSIS — Z6832 Body mass index (BMI) 32.0-32.9, adult: Secondary | ICD-10-CM | POA: Diagnosis not present

## 2014-03-03 DIAGNOSIS — L4050 Arthropathic psoriasis, unspecified: Secondary | ICD-10-CM | POA: Diagnosis not present

## 2014-03-17 DIAGNOSIS — Z6832 Body mass index (BMI) 32.0-32.9, adult: Secondary | ICD-10-CM | POA: Diagnosis not present

## 2014-03-17 DIAGNOSIS — L4050 Arthropathic psoriasis, unspecified: Secondary | ICD-10-CM | POA: Diagnosis not present

## 2014-03-26 ENCOUNTER — Other Ambulatory Visit: Payer: Self-pay

## 2014-03-30 ENCOUNTER — Telehealth: Payer: Self-pay | Admitting: *Deleted

## 2014-03-30 ENCOUNTER — Other Ambulatory Visit: Payer: Self-pay | Admitting: *Deleted

## 2014-03-30 MED ORDER — ROSUVASTATIN CALCIUM 10 MG PO TABS: 10.0000 mg | ORAL_TABLET | Freq: Every day | ORAL | Status: DC

## 2014-03-30 NOTE — Telephone Encounter (Signed)
Patient calling to have refills sent in to [facility]. She was told they never received refills from Oct from us but also they have had computer faxing issues. She does know she needs crestor but not sure with other refills they did not receive and they only told her Crestor.

## 2014-03-31 DIAGNOSIS — L4050 Arthropathic psoriasis, unspecified: Secondary | ICD-10-CM | POA: Diagnosis not present

## 2014-04-14 DIAGNOSIS — L4050 Arthropathic psoriasis, unspecified: Secondary | ICD-10-CM | POA: Diagnosis not present

## 2014-04-14 DIAGNOSIS — Z6833 Body mass index (BMI) 33.0-33.9, adult: Secondary | ICD-10-CM | POA: Diagnosis not present

## 2014-04-28 DIAGNOSIS — L4050 Arthropathic psoriasis, unspecified: Secondary | ICD-10-CM | POA: Diagnosis not present

## 2014-05-01 ENCOUNTER — Encounter: Payer: Self-pay | Admitting: Cardiovascular Disease

## 2014-05-01 DIAGNOSIS — E1139 Type 2 diabetes mellitus with other diabetic ophthalmic complication: Secondary | ICD-10-CM | POA: Diagnosis not present

## 2014-05-01 DIAGNOSIS — R82998 Other abnormal findings in urine: Secondary | ICD-10-CM | POA: Diagnosis not present

## 2014-05-01 DIAGNOSIS — H40019 Open angle with borderline findings, low risk, unspecified eye: Secondary | ICD-10-CM | POA: Diagnosis not present

## 2014-05-01 DIAGNOSIS — H524 Presbyopia: Secondary | ICD-10-CM | POA: Diagnosis not present

## 2014-05-01 DIAGNOSIS — I10 Essential (primary) hypertension: Secondary | ICD-10-CM | POA: Diagnosis not present

## 2014-05-01 DIAGNOSIS — H35039 Hypertensive retinopathy, unspecified eye: Secondary | ICD-10-CM | POA: Diagnosis not present

## 2014-05-01 DIAGNOSIS — N183 Chronic kidney disease, stage 3 unspecified: Secondary | ICD-10-CM | POA: Diagnosis not present

## 2014-05-01 DIAGNOSIS — E11319 Type 2 diabetes mellitus with unspecified diabetic retinopathy without macular edema: Secondary | ICD-10-CM | POA: Diagnosis not present

## 2014-05-01 DIAGNOSIS — E785 Hyperlipidemia, unspecified: Secondary | ICD-10-CM | POA: Diagnosis not present

## 2014-05-01 DIAGNOSIS — E119 Type 2 diabetes mellitus without complications: Secondary | ICD-10-CM | POA: Diagnosis not present

## 2014-05-01 DIAGNOSIS — M810 Age-related osteoporosis without current pathological fracture: Secondary | ICD-10-CM | POA: Diagnosis not present

## 2014-05-08 DIAGNOSIS — E785 Hyperlipidemia, unspecified: Secondary | ICD-10-CM | POA: Diagnosis not present

## 2014-05-08 DIAGNOSIS — N183 Chronic kidney disease, stage 3 unspecified: Secondary | ICD-10-CM | POA: Diagnosis not present

## 2014-05-08 DIAGNOSIS — L4050 Arthropathic psoriasis, unspecified: Secondary | ICD-10-CM | POA: Diagnosis not present

## 2014-05-08 DIAGNOSIS — E1139 Type 2 diabetes mellitus with other diabetic ophthalmic complication: Secondary | ICD-10-CM | POA: Diagnosis not present

## 2014-05-08 DIAGNOSIS — M810 Age-related osteoporosis without current pathological fracture: Secondary | ICD-10-CM | POA: Diagnosis not present

## 2014-05-08 DIAGNOSIS — I10 Essential (primary) hypertension: Secondary | ICD-10-CM | POA: Diagnosis not present

## 2014-05-08 DIAGNOSIS — I2510 Atherosclerotic heart disease of native coronary artery without angina pectoris: Secondary | ICD-10-CM | POA: Diagnosis not present

## 2014-05-08 DIAGNOSIS — Z0000 Encounter for general adult medical examination without abnormal findings: Secondary | ICD-10-CM | POA: Diagnosis not present

## 2014-05-12 DIAGNOSIS — L4050 Arthropathic psoriasis, unspecified: Secondary | ICD-10-CM | POA: Diagnosis not present

## 2014-05-12 DIAGNOSIS — Z23 Encounter for immunization: Secondary | ICD-10-CM | POA: Diagnosis not present

## 2014-05-12 DIAGNOSIS — Z6832 Body mass index (BMI) 32.0-32.9, adult: Secondary | ICD-10-CM | POA: Diagnosis not present

## 2014-05-14 DIAGNOSIS — M1710 Unilateral primary osteoarthritis, unspecified knee: Secondary | ICD-10-CM | POA: Diagnosis not present

## 2014-05-14 DIAGNOSIS — Z111 Encounter for screening for respiratory tuberculosis: Secondary | ICD-10-CM | POA: Diagnosis not present

## 2014-05-14 DIAGNOSIS — L408 Other psoriasis: Secondary | ICD-10-CM | POA: Diagnosis not present

## 2014-05-14 DIAGNOSIS — L4050 Arthropathic psoriasis, unspecified: Secondary | ICD-10-CM | POA: Diagnosis not present

## 2014-05-14 DIAGNOSIS — Z09 Encounter for follow-up examination after completed treatment for conditions other than malignant neoplasm: Secondary | ICD-10-CM | POA: Diagnosis not present

## 2014-05-26 DIAGNOSIS — L4050 Arthropathic psoriasis, unspecified: Secondary | ICD-10-CM | POA: Diagnosis not present

## 2014-05-29 DIAGNOSIS — M1710 Unilateral primary osteoarthritis, unspecified knee: Secondary | ICD-10-CM | POA: Diagnosis not present

## 2014-06-05 DIAGNOSIS — M1710 Unilateral primary osteoarthritis, unspecified knee: Secondary | ICD-10-CM | POA: Diagnosis not present

## 2014-06-09 DIAGNOSIS — L4050 Arthropathic psoriasis, unspecified: Secondary | ICD-10-CM | POA: Diagnosis not present

## 2014-06-12 DIAGNOSIS — M1710 Unilateral primary osteoarthritis, unspecified knee: Secondary | ICD-10-CM | POA: Diagnosis not present

## 2014-06-19 DIAGNOSIS — M1710 Unilateral primary osteoarthritis, unspecified knee: Secondary | ICD-10-CM | POA: Diagnosis not present

## 2014-06-23 DIAGNOSIS — L4050 Arthropathic psoriasis, unspecified: Secondary | ICD-10-CM | POA: Diagnosis not present

## 2014-06-23 DIAGNOSIS — Z6831 Body mass index (BMI) 31.0-31.9, adult: Secondary | ICD-10-CM | POA: Diagnosis not present

## 2014-07-03 DIAGNOSIS — M1710 Unilateral primary osteoarthritis, unspecified knee: Secondary | ICD-10-CM | POA: Diagnosis not present

## 2014-07-07 DIAGNOSIS — L408 Other psoriasis: Secondary | ICD-10-CM | POA: Diagnosis not present

## 2014-07-07 DIAGNOSIS — L4050 Arthropathic psoriasis, unspecified: Secondary | ICD-10-CM | POA: Diagnosis not present

## 2014-07-13 ENCOUNTER — Other Ambulatory Visit: Payer: Self-pay

## 2014-07-13 MED ORDER — ROSUVASTATIN CALCIUM 10 MG PO TABS: 10.0000 mg | ORAL_TABLET | Freq: Every day | ORAL | Status: DC

## 2014-07-13 NOTE — Progress Notes (Signed)
Patient ID: Margaret KirschnerArleen Velez-Jackson, female   DOB: 1951-07-14, 63 y.o.   MRN: [id number] Called to get PA done for crestor and it was approved 07/13/16 ID# [id number] REF# [id number]

## 2014-07-21 DIAGNOSIS — L4050 Arthropathic psoriasis, unspecified: Secondary | ICD-10-CM | POA: Diagnosis not present

## 2014-08-04 DIAGNOSIS — L4050 Arthropathic psoriasis, unspecified: Secondary | ICD-10-CM | POA: Diagnosis not present

## 2014-08-17 DIAGNOSIS — E1139 Type 2 diabetes mellitus with other diabetic ophthalmic complication: Secondary | ICD-10-CM | POA: Diagnosis not present

## 2014-08-17 DIAGNOSIS — I10 Essential (primary) hypertension: Secondary | ICD-10-CM | POA: Diagnosis not present

## 2014-08-17 DIAGNOSIS — Z79899 Other long term (current) drug therapy: Secondary | ICD-10-CM | POA: Diagnosis not present

## 2014-08-17 DIAGNOSIS — N183 Chronic kidney disease, stage 3 unspecified: Secondary | ICD-10-CM | POA: Diagnosis not present

## 2014-08-17 DIAGNOSIS — M810 Age-related osteoporosis without current pathological fracture: Secondary | ICD-10-CM | POA: Diagnosis not present

## 2014-08-17 DIAGNOSIS — E785 Hyperlipidemia, unspecified: Secondary | ICD-10-CM | POA: Diagnosis not present

## 2014-08-18 DIAGNOSIS — L4050 Arthropathic psoriasis, unspecified: Secondary | ICD-10-CM | POA: Diagnosis not present

## 2014-09-01 DIAGNOSIS — Z6833 Body mass index (BMI) 33.0-33.9, adult: Secondary | ICD-10-CM | POA: Diagnosis not present

## 2014-09-01 DIAGNOSIS — L4050 Arthropathic psoriasis, unspecified: Secondary | ICD-10-CM | POA: Diagnosis not present

## 2014-09-15 DIAGNOSIS — L4050 Arthropathic psoriasis, unspecified: Secondary | ICD-10-CM | POA: Diagnosis not present

## 2014-09-27 ENCOUNTER — Other Ambulatory Visit: Payer: Self-pay | Admitting: Cardiovascular Disease

## 2014-09-29 DIAGNOSIS — L4050 Arthropathic psoriasis, unspecified: Secondary | ICD-10-CM | POA: Diagnosis not present

## 2014-09-29 DIAGNOSIS — Z6833 Body mass index (BMI) 33.0-33.9, adult: Secondary | ICD-10-CM | POA: Diagnosis not present

## 2014-10-01 DIAGNOSIS — H40013 Open angle with borderline findings, low risk, bilateral: Secondary | ICD-10-CM | POA: Diagnosis not present

## 2014-10-08 ENCOUNTER — Other Ambulatory Visit: Payer: Self-pay | Admitting: Cardiovascular Disease

## 2014-10-13 DIAGNOSIS — Z23 Encounter for immunization: Secondary | ICD-10-CM | POA: Diagnosis not present

## 2014-10-13 DIAGNOSIS — Z6833 Body mass index (BMI) 33.0-33.9, adult: Secondary | ICD-10-CM | POA: Diagnosis not present

## 2014-10-13 DIAGNOSIS — L4050 Arthropathic psoriasis, unspecified: Secondary | ICD-10-CM | POA: Diagnosis not present

## 2014-10-20 ENCOUNTER — Other Ambulatory Visit: Payer: Self-pay

## 2014-10-20 MED ORDER — METOPROLOL SUCCINATE ER 100 MG PO TB24: 100.0000 mg | ORAL_TABLET | Freq: Every day | ORAL | Status: DC

## 2014-10-20 MED ORDER — ROSUVASTATIN CALCIUM 10 MG PO TABS: 10.0000 mg | ORAL_TABLET | Freq: Every day | ORAL | Status: DC

## 2014-10-20 MED ORDER — LOSARTAN POTASSIUM-HCTZ 100-25 MG PO TABS: 1.0000 | ORAL_TABLET | Freq: Every day | ORAL | Status: DC

## 2014-10-27 DIAGNOSIS — M810 Age-related osteoporosis without current pathological fracture: Secondary | ICD-10-CM | POA: Diagnosis not present

## 2014-10-27 DIAGNOSIS — L4050 Arthropathic psoriasis, unspecified: Secondary | ICD-10-CM | POA: Diagnosis not present

## 2014-10-27 DIAGNOSIS — I10 Essential (primary) hypertension: Secondary | ICD-10-CM | POA: Diagnosis not present

## 2014-10-27 DIAGNOSIS — Z6834 Body mass index (BMI) 34.0-34.9, adult: Secondary | ICD-10-CM | POA: Diagnosis not present

## 2014-11-04 DIAGNOSIS — E11319 Type 2 diabetes mellitus with unspecified diabetic retinopathy without macular edema: Secondary | ICD-10-CM | POA: Diagnosis not present

## 2014-11-04 DIAGNOSIS — N183 Chronic kidney disease, stage 3 (moderate): Secondary | ICD-10-CM | POA: Diagnosis not present

## 2014-11-04 DIAGNOSIS — E1139 Type 2 diabetes mellitus with other diabetic ophthalmic complication: Secondary | ICD-10-CM | POA: Diagnosis not present

## 2014-11-04 DIAGNOSIS — M810 Age-related osteoporosis without current pathological fracture: Secondary | ICD-10-CM | POA: Diagnosis not present

## 2014-11-04 DIAGNOSIS — I10 Essential (primary) hypertension: Secondary | ICD-10-CM | POA: Diagnosis not present

## 2014-11-04 DIAGNOSIS — E785 Hyperlipidemia, unspecified: Secondary | ICD-10-CM | POA: Diagnosis not present

## 2014-11-04 DIAGNOSIS — L4050 Arthropathic psoriasis, unspecified: Secondary | ICD-10-CM | POA: Diagnosis not present

## 2014-11-04 DIAGNOSIS — M859 Disorder of bone density and structure, unspecified: Secondary | ICD-10-CM | POA: Diagnosis not present

## 2014-11-04 DIAGNOSIS — Z1389 Encounter for screening for other disorder: Secondary | ICD-10-CM | POA: Diagnosis not present

## 2014-11-10 DIAGNOSIS — L4050 Arthropathic psoriasis, unspecified: Secondary | ICD-10-CM | POA: Diagnosis not present

## 2014-11-10 DIAGNOSIS — M810 Age-related osteoporosis without current pathological fracture: Secondary | ICD-10-CM | POA: Diagnosis not present

## 2014-11-10 DIAGNOSIS — Z6833 Body mass index (BMI) 33.0-33.9, adult: Secondary | ICD-10-CM | POA: Diagnosis not present

## 2014-11-12 DIAGNOSIS — M810 Age-related osteoporosis without current pathological fracture: Secondary | ICD-10-CM | POA: Diagnosis not present

## 2014-11-12 DIAGNOSIS — L4050 Arthropathic psoriasis, unspecified: Secondary | ICD-10-CM | POA: Diagnosis not present

## 2014-11-12 DIAGNOSIS — L409 Psoriasis, unspecified: Secondary | ICD-10-CM | POA: Diagnosis not present

## 2014-11-12 DIAGNOSIS — M1711 Unilateral primary osteoarthritis, right knee: Secondary | ICD-10-CM | POA: Diagnosis not present

## 2014-11-24 DIAGNOSIS — L4050 Arthropathic psoriasis, unspecified: Secondary | ICD-10-CM | POA: Diagnosis not present

## 2014-11-24 DIAGNOSIS — Z6834 Body mass index (BMI) 34.0-34.9, adult: Secondary | ICD-10-CM | POA: Diagnosis not present

## 2014-11-26 ENCOUNTER — Ambulatory Visit (INDEPENDENT_AMBULATORY_CARE_PROVIDER_SITE_OTHER): Payer: Medicare Other | Admitting: Cardiovascular Disease

## 2014-11-26 ENCOUNTER — Encounter: Payer: Self-pay | Admitting: Cardiovascular Disease

## 2014-11-26 VITALS — BP 132/80 | HR 72 | Ht 61.0 in | Wt 207.0 lb

## 2014-11-26 DIAGNOSIS — I6529 Occlusion and stenosis of unspecified carotid artery: Secondary | ICD-10-CM | POA: Diagnosis not present

## 2014-11-26 DIAGNOSIS — E1142 Type 2 diabetes mellitus with diabetic polyneuropathy: Secondary | ICD-10-CM | POA: Diagnosis not present

## 2014-11-26 DIAGNOSIS — I2510 Atherosclerotic heart disease of native coronary artery without angina pectoris: Secondary | ICD-10-CM

## 2014-11-26 DIAGNOSIS — I10 Essential (primary) hypertension: Secondary | ICD-10-CM | POA: Diagnosis not present

## 2014-11-26 DIAGNOSIS — E7800 Pure hypercholesterolemia, unspecified: Secondary | ICD-10-CM

## 2014-11-26 MED ORDER — NITROGLYCERIN 0.4 MG SL SUBL: 0.4000 mg | SUBLINGUAL_TABLET | SUBLINGUAL | Status: DC | PRN

## 2014-11-26 NOTE — Assessment & Plan Note (Signed)
Well controlled.  Continue current medications and low sodium Dash type diet.    

## 2014-11-26 NOTE — Progress Notes (Signed)
Patient ID: Margaret KirschnerArleen Hobbs, female   DOB: [birth year], 63 y.o.   MRN: [id number] Margaret Hobbs is seen today for F/U of elevated lipids, HTN and CAD she had her last cath in 05/2007 and had a patent stent to the RCA which was placed in January of 2008. She has not had any SSCP. Her BP has been under good controll. She sees Dr. Clelia Hobbs for blood work in a couple of weeks. She is on humera for psoriasis and would be considered immunocompromized. She already got a flue shot. She needs a refill on her meds through [facility] . She is a bit depressed again this am. She has gained some weight. She has had a previous CVA with normal carotids and has a hard time with some LLE weakness  Seeing Dr Manson PasseyBrown to fix front teeth      ROS: Denies fever, malais, weight loss, blurry vision, decreased visual acuity, cough, sputum, SOB, hemoptysis, pleuritic pain, palpitaitons, heartburn, abdominal pain, melena, lower extremity edema, claudication, or rash.  All other systems reviewed and negative  General: Affect appropriate Healthy:  appears stated age HEENT: normal Neck supple with no adenopathy JVP normal no bruits no thyromegaly Lungs clear with no wheezing and good diaphragmatic motion Heart:  S1/S2 no murmur, no rub, gallop or click PMI normal Abdomen: benighn, BS positve, no tenderness, no AAA no bruit.  No HSM or HJR Distal pulses intact with no bruits No edema Neuro non-focal Skin warm and dry No muscular weakness   Current Outpatient Prescriptions  Medication Sig Dispense Refill  . adalimumab (HUMIRA) 40 MG/0.8ML injection Inject 40 mg into the skin every 14 (fourteen) days.      . Ascorbic Acid (VITAMIN C) 500 MG tablet Take 500 mg by mouth daily.      [name]. aspirin 81 MG tablet Take 81 mg by mouth daily.      . clopidogrel (PLAVIX) 75 MG tablet Take 1 tablet (75 mg total) by mouth daily. 90 tablet 3  . co-enzyme Q-10 30 MG capsule Take 30 mg by mouth daily.     . cyanocobalamin 1000 MCG tablet Take  1,000 mcg by mouth daily.     . folic acid (FOLVITE) 1 MG tablet Take 1 mg by mouth daily.      [name]. glipiZIDE-metformin (METAGLIP) 5-500 MG per tablet Take 1 tablet by mouth 2 (two) times daily before a meal.      . hydrALAZINE (APRESOLINE) 50 MG tablet TAKE 1 TABLET TWICE A DAY 180 tablet 0  . losartan-hydrochlorothiazide (HYZAAR) 100-25 MG per tablet Take 1 tablet by mouth daily. 90 tablet 0  . metoprolol succinate (TOPROL-XL) 100 MG 24 hr tablet Take 1 tablet (100 mg total) by mouth daily. 90 tablet 0  . Risedronate Sodium (ATELVIA PO) Take 35 mg by mouth once a week. Monday    . rosuvastatin (CRESTOR) 10 MG tablet Take 1 tablet (10 mg total) by mouth daily. 90 tablet 0  . vitamin A 4098110000 UNIT capsule Take 10,000 Units by mouth daily.      . vitamin E 400 UNIT capsule Take 400 Units by mouth daily.      . calcium-vitamin D (OSCAL) 250-125 MG-UNIT per tablet Take 1 tablet by mouth daily.      . nitroGLYCERIN (NITROSTAT) 0.4 MG SL tablet Place 1 tablet (0.4 mg total) under the tongue every 5 (five) minutes as needed for chest pain. (Patient not taking: Reported on 11/26/2014) 25 tablet 3   No current facility-administered medications for  this visit.    Allergies  Remicade and Sulfa antibiotics  Electrocardiogram:  SR rate 72 possible old anterior MI no change from 2014   Assessment and Plan

## 2014-11-26 NOTE — Assessment & Plan Note (Signed)
Discussed low carb diet.  Target hemoglobin A1c is 6.5 or less.  Continue current medications.  

## 2014-11-26 NOTE — Assessment & Plan Note (Signed)
Cholesterol is at goal.  Continue current dose of statin and diet Rx.  No myalgias or side effects.  F/U  LFT's in 6 months. Lab Results  [facility] Date   [facility]  [facility]    57        Total Cholesterol/HDL:CHD Risk Coronary Heart Disease Risk Table                     Men   Women  1/2 Average Risk   3.4   3.3  Labs with primary she reports LDL is good

## 2014-11-26 NOTE — Patient Instructions (Signed)
Your physician wants you to follow-up in: YEAR WITH DR NISHAN  You will receive a reminder letter in the mail two months in advance. If you don't receive a letter, please call our office to schedule the follow-up appointment.  Your physician recommends that you continue on your current medications as directed. Please refer to the Current Medication list given to you today. 

## 2014-11-26 NOTE — Assessment & Plan Note (Signed)
Stable with no angina and good activity level.  Continue medical Rx New SL nitro called in

## 2014-12-08 DIAGNOSIS — L4050 Arthropathic psoriasis, unspecified: Secondary | ICD-10-CM | POA: Diagnosis not present

## 2014-12-08 DIAGNOSIS — M810 Age-related osteoporosis without current pathological fracture: Secondary | ICD-10-CM | POA: Diagnosis not present

## 2014-12-08 DIAGNOSIS — Z6834 Body mass index (BMI) 34.0-34.9, adult: Secondary | ICD-10-CM | POA: Diagnosis not present

## 2014-12-22 DIAGNOSIS — L4050 Arthropathic psoriasis, unspecified: Secondary | ICD-10-CM | POA: Diagnosis not present

## 2014-12-22 DIAGNOSIS — Z6834 Body mass index (BMI) 34.0-34.9, adult: Secondary | ICD-10-CM | POA: Diagnosis not present

## 2014-12-26 ENCOUNTER — Other Ambulatory Visit: Payer: Self-pay | Admitting: Cardiovascular Disease

## 2015-01-05 DIAGNOSIS — Z79899 Other long term (current) drug therapy: Secondary | ICD-10-CM | POA: Diagnosis not present

## 2015-01-05 DIAGNOSIS — M810 Age-related osteoporosis without current pathological fracture: Secondary | ICD-10-CM | POA: Diagnosis not present

## 2015-01-05 DIAGNOSIS — Z6834 Body mass index (BMI) 34.0-34.9, adult: Secondary | ICD-10-CM | POA: Diagnosis not present

## 2015-01-05 DIAGNOSIS — L4050 Arthropathic psoriasis, unspecified: Secondary | ICD-10-CM | POA: Diagnosis not present

## 2015-01-13 ENCOUNTER — Encounter: Payer: Self-pay | Admitting: Family

## 2015-01-14 ENCOUNTER — Ambulatory Visit (HOSPITAL_COMMUNITY)
Admission: RE | Admit: 2015-01-14 | Discharge: 2015-01-14 | Disposition: A | Discharge status: Home / Self Care | Payer: Medicare Other | Source: Ambulatory Visit | Attending: Family | Admitting: Family

## 2015-01-14 ENCOUNTER — Encounter: Payer: Self-pay | Admitting: Family

## 2015-01-14 ENCOUNTER — Ambulatory Visit (INDEPENDENT_AMBULATORY_CARE_PROVIDER_SITE_OTHER): Payer: Medicare Other | Admitting: Family

## 2015-01-14 VITALS — BP 134/66 | HR 72 | Resp 16 | Ht 62.0 in | Wt 209.0 lb

## 2015-01-14 DIAGNOSIS — Z8673 Personal history of transient ischemic attack (TIA), and cerebral infarction without residual deficits: Secondary | ICD-10-CM | POA: Insufficient documentation

## 2015-01-14 DIAGNOSIS — I6523 Occlusion and stenosis of bilateral carotid arteries: Secondary | ICD-10-CM

## 2015-01-14 NOTE — Addendum Note (Signed)
Addended by: Sharee PimpleMCCHESNEY, Cleota Pellerito K on: 01/14/2015 12:33 PM   Modules accepted: Orders

## 2015-01-14 NOTE — Patient Instructions (Signed)
Stroke Prevention Some medical conditions and behaviors are associated with an increased chance of having a stroke. You may prevent a stroke by making healthy choices and managing medical conditions. HOW CAN I REDUCE MY RISK OF HAVING A STROKE?   Stay physically active. Get at least 30 minutes of activity on most or all days.  Do not smoke. It may also be helpful to avoid exposure to secondhand smoke.  Limit alcohol use. Moderate alcohol use is considered to be:  No more than 2 drinks per day for men.  No more than 1 drink per day for nonpregnant women.  Eat healthy foods. This involves:  Eating 5 or more servings of fruits and vegetables a day.  Making dietary changes that address high blood pressure (hypertension), high cholesterol, diabetes, or obesity.  Manage your cholesterol levels.  Making food choices that are high in fiber and low in saturated fat, trans fat, and cholesterol may control cholesterol levels.  Take any prescribed medicines to control cholesterol as directed by your health care provider.  Manage your diabetes.  Controlling your carbohydrate and sugar intake is recommended to manage diabetes.  Take any prescribed medicines to control diabetes as directed by your health care provider.  Control your hypertension.  Making food choices that are low in salt (sodium), saturated fat, trans fat, and cholesterol is recommended to manage hypertension.  Take any prescribed medicines to control hypertension as directed by your health care provider.  Maintain a healthy weight.  Reducing calorie intake and making food choices that are low in sodium, saturated fat, trans fat, and cholesterol are recommended to manage weight.  Stop drug abuse.  Avoid taking birth control pills.  Talk to your health care provider about the risks of taking birth control pills if you are over 35 years old, smoke, get migraines, or have ever had a blood clot.  Get evaluated for sleep  disorders (sleep apnea).  Talk to your health care provider about getting a sleep evaluation if you snore a lot or have excessive sleepiness.  Take medicines only as directed by your health care provider.  For some people, aspirin or blood thinners (anticoagulants) are helpful in reducing the risk of forming abnormal blood clots that can lead to stroke. If you have the irregular heart rhythm of atrial fibrillation, you should be on a blood thinner unless there is a good reason you cannot take them.  Understand all your medicine instructions.  Make sure that other conditions (such as anemia or atherosclerosis) are addressed. SEEK IMMEDIATE MEDICAL CARE IF:   You have sudden weakness or numbness of the face, arm, or leg, especially on one side of the body.  Your face or eyelid droops to one side.  You have sudden confusion.  You have trouble speaking (aphasia) or understanding.  You have sudden trouble seeing in one or both eyes.  You have sudden trouble walking.  You have dizziness.  You have a loss of balance or coordination.  You have a sudden, severe headache with no known cause.  You have new chest pain or an irregular heartbeat. Any of these symptoms may represent a serious problem that is an emergency. Do not wait to see if the symptoms will go away. Get medical help at once. Call your local emergency services (911 in [place] Do not drive yourself to the hospital. Document Released: 01/18/2005 Document Revised: 04/27/2014 Document Reviewed: 06/13/2013 [employer] Patient Information 2015 [employer]. This information is not intended to replace advice given   to you by your health care provider. Make sure you discuss any questions you have with your health care provider.  

## 2015-01-14 NOTE — Progress Notes (Signed)
Established Carotid Patient   History of Present Illness  Margaret Hobbs is a 64 y.o. female patient that Dr. Arbie CookeyEarly has been following for known carotid stenosis. She returns today for follow up.  January 2008 she had an MI, had a stent placed, June, 2008 had a stroke as manifested by right hemiplegia and slurred speech; denies monocular blindness. Her only residual neurological deficits are right leg weakness.  Pt denies any further stroke or TIA symptoms since 2008. Pt denies any steal symptoms in arms/hands.  Patient has not had previous carotid artery intervention.  Patient denies New Medical or Surgical History. She denies claudication symptoms, denies non-healing wounds.  Pt Diabetic: Yes,  Review of records: last A1C was 5.6% in May, 2015, which is normal Pt smoker: non-smoker  Pt meds include: Statin : Yes Betablocker: Yes ASA: Yes Other anticoagulants/antiplatelets: Plavix  Past Medical History  Diagnosis Date  . TIA (transient ischemic attack)   . UTI (urinary tract infection)   . HLD (hyperlipidemia)   . Cerebrovascular disease   . Aphasia   . DM2 (diabetes mellitus, type 2)   . CVA (cerebral vascular accident)   . Hyperparathyroidism   . Arthritis   . HTN (hypertension)   . Hypercholesteremia   . MI (myocardial infarction)   . CAD (coronary artery disease)   . Depressed   . Diabetes mellitus     Social History History  Substance Use Topics  . Smoking status: Never Smoker   . Smokeless tobacco: Never Used  . Alcohol Use: No    Family History Family History  Problem Relation Age of Onset  . Diabetes    . Hypertension    . Alcohol abuse    . Coronary artery disease    . Heart disease Mother     pacemaker  . Hypertension Mother   . Hyperlipidemia Mother   . Diabetes Brother   . Hypertension Son     Surgical History Past Surgical History  Procedure Laterality Date  . Hysterectomy - unknown type    . Abdominal hysterectomy       Allergies  Allergen Reactions  . Remicade [Infliximab] Other (See Comments)    Convulsion and felt cold   . Sulfa Antibiotics Rash    Current Outpatient Prescriptions  Medication Sig Dispense Refill  . adalimumab (HUMIRA) 40 MG/0.8ML injection Inject 40 mg into the skin every 14 (fourteen) days.      . Ascorbic Acid (VITAMIN C) 500 MG tablet Take 500 mg by mouth daily.      [name]. aspirin 81 MG tablet Take 81 mg by mouth daily.      . calcium-vitamin D (OSCAL) 250-125 MG-UNIT per tablet Take 1 tablet by mouth daily.      . clopidogrel (PLAVIX) 75 MG tablet Take 1 tablet (75 mg total) by mouth daily. 90 tablet 3  . co-enzyme Q-10 30 MG capsule Take 30 mg by mouth daily.     . cyanocobalamin 1000 MCG tablet Take 1,000 mcg by mouth daily.     . folic acid (FOLVITE) 1 MG tablet Take 1 mg by mouth daily.      [name]. glipiZIDE-metformin (METAGLIP) 5-500 MG per tablet Take 1 tablet by mouth 2 (two) times daily before a meal.      . hydrALAZINE (APRESOLINE) 50 MG tablet Take 1 tablet (50 mg total) by mouth 2 (two) times daily. 180 tablet 3  . losartan-hydrochlorothiazide (HYZAAR) 100-25 MG per tablet TAKE 1 TABLET DAILY 90 tablet 2  .  metoprolol succinate (TOPROL-XL) 100 MG 24 hr tablet Take 1 tablet (100 mg total) by mouth daily. 90 tablet 0  . nitroGLYCERIN (NITROSTAT) 0.4 MG SL tablet Place 1 tablet (0.4 mg total) under the tongue every 5 (five) minutes as needed for chest pain. 25 tablet 3  . Risedronate Sodium (ATELVIA PO) Take 35 mg by mouth once a week. Monday    . rosuvastatin (CRESTOR) 10 MG tablet Take 1 tablet (10 mg total) by mouth daily. 90 tablet 0  . vitamin A 16109 UNIT capsule Take 10,000 Units by mouth daily.      . vitamin E 400 UNIT capsule Take 400 Units by mouth daily.       No current facility-administered medications for this visit.    Review of Systems : See HPI for pertinent positives and negatives.  Physical Examination  Filed Vitals:   01/14/15 1043 01/14/15 1048   BP: 124/61 134/66  Pulse: 69 72  Resp:  16  Height:   (1.575 m)  Weight:  209 lb (94.802 kg)  SpO2:  97%   Body mass index is 38.22 kg/(m^2).  General: WDWN obese female in NAD GAIT: mild limp Eyes: PERRLA Pulmonary: CTAB, Negative Rales, Negative rhonchi, & Negative wheezing.  Cardiac: regular rhythm, no detected murmurs.  VASCULAR EXAM Carotid Bruits Left Right   Positive Negative   Aorta is not palpable. Radial pulses are 2+ palpable and equal.      LE Pulses LEFT RIGHT   POPLITEAL not palpable  not palpable   POSTERIOR TIBIAL  palpable   palpable    DORSALIS PEDIS  ANTERIOR TIBIAL not palpable  not palpable     Gastrointestinal: soft, nontender, BS WNL, no r/g, no palpable masses.  Musculoskeletal: Negative muscle atrophy/wasting. M/S 5/5 in UE's, 3/5 in LLE, 2/5 in RLE, Extremities without ischemic changes.  Neurologic: A&O X 3; Appropriate Affect, Speech is normal CN 2-12 intact, Pain and light touch intact in extremities, Motor exam as listed above.   Non-Invasive Vascular Imaging CAROTID DUPLEX 01/14/2015   CEREBROVASCULAR DUPLEX EVALUATION    INDICATION: Carotid disease    PREVIOUS INTERVENTION(S):     DUPLEX EXAM:     RIGHT  LEFT  Peak Systolic Velocities (cm/s) End Diastolic Velocities (cm/s) Plaque LOCATION Peak Systolic Velocities (cm/s) End Diastolic Velocities (cm/s) Plaque  95 26  CCA PROXIMAL 120 23   113 25  CCA MID 93 24   94 18  CCA DISTAL 79 21 HM  129 11 HT ECA 157 17 HT  98 31 HT ICA PROXIMAL 96 30 HT  68 26  ICA MID 92 23   66 22  ICA DISTAL 56 24     1.04 ICA / CCA Ratio (PSV) 1.2  Antegrade Vertebral Flow Antegrade  150 Brachial Systolic Pressure (mmHg) 148  Multiphasic (subclavian artery) Brachial Artery Waveforms Multiphasic (subclavian artery)     Plaque Morphology:  HM = Homogeneous, HT = Heterogeneous, CP = Calcific Plaque, SP = Smooth Plaque, IP = Irregular Plaque     ADDITIONAL FINDINGS: No significant stenosis of the bilateral external or common carotid arteries.    IMPRESSION: Doppler velocities suggest less than 40% stenoses of the bilateral proximal internal carotid arteries.    Compared to the previous exam:  Velocities of the left internal carotid artery appear less than previously recorded when compared to the exam on 01/08/14 with the right internal carotid artery remaining stable.        Assessment: Margaret Hobbs is  a 64 y.o. female who presents with asymptomatic minimal bilateral ICA stenosis. Velocities of the left internal carotid artery appear less than previously recorded when compared to the exam on 01/08/14 with the right internal carotid artery remaining stable.   Plan: Follow-up in 1 year with Carotid Duplex.   I discussed in depth with the patient the nature of atherosclerosis, and emphasized the importance of maximal medical management including strict control of blood pressure, blood glucose, and lipid levels, obtaining regular exercise, and continued cessation of smoking.  The patient is aware that without maximal medical management the underlying atherosclerotic disease process will progress, limiting the benefit of any interventions. The patient was given information about stroke prevention and what symptoms should prompt the patient to seek immediate medical care. Thank you for allowing [place] to participate in this patient's care.  Charisse March, RN, MSN, FNP-C [facility] Office: [phone]  Clinic Physician: Darrick Penna  01/14/2015  10:33 AM

## 2015-01-19 ENCOUNTER — Other Ambulatory Visit: Payer: Self-pay

## 2015-01-19 MED ORDER — METOPROLOL SUCCINATE ER 100 MG PO TB24: 100.0000 mg | ORAL_TABLET | Freq: Every day | ORAL | Status: DC

## 2015-01-19 MED ORDER — ROSUVASTATIN CALCIUM 10 MG PO TABS: 10.0000 mg | ORAL_TABLET | Freq: Every day | ORAL | Status: DC

## 2015-01-25 ENCOUNTER — Telehealth: Payer: Self-pay

## 2015-01-25 NOTE — Telephone Encounter (Signed)
Patient called about her copay for her meds was 1500 dollars and she needed her crestor and metoprolol. I told her that I could give her samples of crestor but she has to buy the metoprolol. I also to.ld her that she needed to call her insurance about her copay. Place her crestor samples up front

## 2015-01-28 ENCOUNTER — Encounter (HOSPITAL_COMMUNITY): Payer: Self-pay

## 2015-01-28 ENCOUNTER — Ambulatory Visit (HOSPITAL_COMMUNITY)
Admission: RE | Admit: 2015-01-28 | Discharge: 2015-01-28 | Disposition: A | Discharge status: Home / Self Care | Payer: Medicare Other | Source: Ambulatory Visit | Attending: Internal Medicine | Admitting: Internal Medicine

## 2015-01-28 ENCOUNTER — Other Ambulatory Visit (HOSPITAL_COMMUNITY): Payer: Self-pay | Admitting: Internal Medicine

## 2015-01-28 DIAGNOSIS — M810 Age-related osteoporosis without current pathological fracture: Secondary | ICD-10-CM | POA: Insufficient documentation

## 2015-01-28 HISTORY — DX: Age-related osteoporosis without current pathological fracture: M81.0

## 2015-01-28 MED ORDER — SODIUM CHLORIDE 0.9 % IV SOLN
Freq: Once | INTRAVENOUS | Status: AC
  Administered 2015-01-28: 14:00:00 via INTRAVENOUS

## 2015-01-28 MED ORDER — ZOLEDRONIC ACID 5 MG/100ML IV SOLN
5.0000 mg | Freq: Once | INTRAVENOUS | Status: AC
  Administered 2015-01-28: 5 mg via INTRAVENOUS
  Filled 2015-01-28: qty 100

## 2015-01-28 NOTE — Progress Notes (Signed)
Pt here for her RECLAST infusion. Pt states she does not take calcium supplements but does take dietary calcium in the form of milk/cheese . Calcium lab results are WNL and infusion of RECLAST proceeds. Pt is to discuss any other questions as to whether she needs additional calcium with her PCP. Uneventful infusion of RECLAST

## 2015-01-28 NOTE — Discharge Instructions (Signed)
Drink  Fluids/water as tolerated over the next 72 hours Tylenol or OTC as directed    RECLAST Zoledronic Acid injection (Paget's Disease, Osteoporosis) What is this medicine? ZOLEDRONIC ACID (ZOE le dron ik AS id) lowers the amount of calcium loss from bone. It is used to treat Paget's disease and osteoporosis in women. This medicine may be used for other purposes; ask your health care provider or pharmacist if you have questions. COMMON BRAND NAME(S): Reclast, Zometa What should I tell my health care provider before I take this medicine? They need to know if you have any of these conditions: -aspirin-sensitive asthma -cancer, especially if you are receiving medicines used to treat cancer -dental disease or wear dentures -infection -kidney disease -low levels of calcium in the blood -past surgery on the parathyroid gland or intestines -receiving corticosteroids like dexamethasone or prednisone -an unusual or allergic reaction to zoledronic acid, other medicines, foods, dyes, or preservatives -pregnant or trying to get pregnant -breast-feeding How should I use this medicine? This medicine is for infusion into a vein. It is given by a health care professional in a hospital or clinic setting. Talk to your pediatrician regarding the use of this medicine in children. This medicine is not approved for use in children. Overdosage: If you think you have taken too much of this medicine contact a poison control center or emergency room at once. NOTE: This medicine is only for you. Do not share this medicine with others. What if I miss a dose? It is important not to miss your dose. Call your doctor or health care professional if you are unable to keep an appointment. What may interact with this medicine? -certain antibiotics given by injection -NSAIDs, medicines for pain and inflammation, like ibuprofen or naproxen -some diuretics like bumetanide, furosemide -teriparatide This list may  not describe all possible interactions. Give your health care provider a list of all the medicines, herbs, non-prescription drugs, or dietary supplements you use. Also tell them if you smoke, drink alcohol, or use illegal drugs. Some items may interact with your medicine. What should I watch for while using this medicine? Visit your doctor or health care professional for regular checkups. It may be some time before you see the benefit from this medicine. Do not stop taking your medicine unless your doctor tells you to. Your doctor may order blood tests or other tests to see how you are doing. Women should inform their doctor if they wish to become pregnant or think they might be pregnant. There is a potential for serious side effects to an unborn child. Talk to your health care professional or pharmacist for more information. You should make sure that you get enough calcium and vitamin D while you are taking this medicine. Discuss the foods you eat and the vitamins you take with your health care professional. Some people who take this medicine have severe bone, joint, and/or muscle pain. This medicine may also increase your risk for jaw problems or a broken thigh bone. Tell your doctor right away if you have severe pain in your jaw, bones, joints, or muscles. Tell your doctor if you have any pain that does not go away or that gets worse. Tell your dentist and dental surgeon that you are taking this medicine. You should not have major dental surgery while on this medicine. See your dentist to have a dental exam and fix any dental problems before starting this medicine. Take good care of your teeth while on this medicine.  Make sure you see your dentist for regular follow-up appointments. What side effects may I notice from receiving this medicine? Side effects that you should report to your doctor or health care professional as soon as possible: -allergic reactions like skin rash, itching or hives, swelling  of the face, lips, or tongue -anxiety, confusion, or depression -breathing problems -changes in vision -eye pain -feeling faint or lightheaded, falls -jaw pain, especially after dental work -mouth sores -muscle cramps, stiffness, or weakness -trouble passing urine or change in the amount of urine Side effects that usually do not require medical attention (report to your doctor or health care professional if they continue or are bothersome): -bone, joint, or muscle pain -constipation -diarrhea -fever -hair loss -irritation at site where injected -loss of appetite -nausea, vomiting -stomach upset -trouble sleeping -trouble swallowing -weak or tired This list may not describe all possible side effects. Call your doctor for medical advice about side effects. You may report side effects to FDA at 1-800-FDA-1088. Where should I keep my medicine? This drug is given in a hospital or clinic and will not be stored at home. NOTE: This sheet is a summary. It may not cover all possible information. If you have questions about this medicine, talk to your doctor, pharmacist, or health care provider.  2015, Elsevier/Gold Standard. (2013-05-26 10:03:48) Osteoporosis Throughout your life, your body breaks down old bone and replaces it with new bone. As you get older, your body does not replace bone as quickly as it breaks it down. By the age of 30 years, most people begin to gradually lose bone because of the imbalance between bone loss and replacement. Some people lose more bone than others. Bone loss beyond a specified normal degree is considered osteoporosis.  Osteoporosis affects the strength and durability of your bones. The inside of the ends of your bones and your flat bones, like the bones of your pelvis, look like honeycomb, filled with tiny open spaces. As bone loss occurs, your bones become less dense. This means that the open spaces inside your bones become bigger and the walls between these  spaces become thinner. This makes your bones weaker. Bones of a person with osteoporosis can become so weak that they can break (fracture) during minor accidents, such as a simple fall. CAUSES  The following factors have been associated with the development of osteoporosis:  Smoking.  Drinking more than 2 alcoholic drinks several days per week.  Long-term use of certain medicines:  Corticosteroids.  Chemotherapy medicines.  Thyroid medicines.  Antiepileptic medicines.  Gonadal hormone suppression medicine.  Immunosuppression medicine.  Being underweight.  Lack of physical activity.  Lack of exposure to the sun. This can lead to vitamin D deficiency.  Certain medical conditions:  Certain inflammatory bowel diseases, such as Crohn disease and ulcerative colitis.  Diabetes.  Hyperthyroidism.  Hyperparathyroidism. RISK FACTORS Anyone can develop osteoporosis. However, the following factors can increase your risk of developing osteoporosis:  Gender--Women are at higher risk than men.  Age--Being older than 50 years increases your risk.  Ethnicity--White and Asian people have an increased risk.  Weight --Being extremely underweight can increase your risk of osteoporosis.  Family history of osteoporosis--Having a family member who has developed osteoporosis can increase your risk. SYMPTOMS  Usually, people with osteoporosis have no symptoms.  DIAGNOSIS  Signs during a physical exam that may prompt your caregiver to suspect osteoporosis include:  Decreased height. This is usually caused by the compression of the bones that form your  spine (vertebrae) because they have weakened and become fractured.  A curving or rounding of the upper back (kyphosis). To confirm signs of osteoporosis, your caregiver may request a procedure that uses 2 low-dose X-ray beams with different levels of energy to measure your bone mineral density (dual-energy X-ray absorptiometry [DXA]).  Also, your caregiver may check your level of vitamin D. TREATMENT  The goal of osteoporosis treatment is to strengthen bones in order to decrease the risk of bone fractures. There are different types of medicines available to help achieve this goal. Some of these medicines work by slowing the processes of bone loss. Some medicines work by increasing bone density. Treatment also involves making sure that your levels of calcium and vitamin D are adequate. PREVENTION  There are things you can do to help prevent osteoporosis. Adequate intake of calcium and vitamin D can help you achieve optimal bone mineral density. Regular exercise can also help, especially resistance and weight-bearing activities. If you smoke, quitting smoking is an important part of osteoporosis prevention. MAKE SURE YOU:  Understand these instructions.  Will watch your condition.  Will get help right away if you are not doing well or get worse. FOR MORE INFORMATION www.osteo.org and [employer].cawww.nof.org Document Released: 09/20/2005 Document Revised: 04/07/2013 Document Reviewed: 11/25/2011 [facility] Patient Information 2015 [place], [place]. This information is not intended to replace advice given to you by your health care provider. Make sure you discuss any questions you have with your health care provider.

## 2015-02-02 DIAGNOSIS — M810 Age-related osteoporosis without current pathological fracture: Secondary | ICD-10-CM | POA: Diagnosis not present

## 2015-02-02 DIAGNOSIS — L4050 Arthropathic psoriasis, unspecified: Secondary | ICD-10-CM | POA: Diagnosis not present

## 2015-02-16 DIAGNOSIS — L4050 Arthropathic psoriasis, unspecified: Secondary | ICD-10-CM | POA: Diagnosis not present

## 2015-02-16 DIAGNOSIS — Z6834 Body mass index (BMI) 34.0-34.9, adult: Secondary | ICD-10-CM | POA: Diagnosis not present

## 2015-02-18 DIAGNOSIS — Z6834 Body mass index (BMI) 34.0-34.9, adult: Secondary | ICD-10-CM | POA: Diagnosis not present

## 2015-02-18 DIAGNOSIS — I10 Essential (primary) hypertension: Secondary | ICD-10-CM | POA: Diagnosis not present

## 2015-02-18 DIAGNOSIS — N183 Chronic kidney disease, stage 3 (moderate): Secondary | ICD-10-CM | POA: Diagnosis not present

## 2015-02-18 DIAGNOSIS — E785 Hyperlipidemia, unspecified: Secondary | ICD-10-CM | POA: Diagnosis not present

## 2015-02-18 DIAGNOSIS — E1139 Type 2 diabetes mellitus with other diabetic ophthalmic complication: Secondary | ICD-10-CM | POA: Diagnosis not present

## 2015-02-18 DIAGNOSIS — L4050 Arthropathic psoriasis, unspecified: Secondary | ICD-10-CM | POA: Diagnosis not present

## 2015-02-18 DIAGNOSIS — R1084 Generalized abdominal pain: Secondary | ICD-10-CM | POA: Diagnosis not present

## 2015-02-18 DIAGNOSIS — I69959 Hemiplegia and hemiparesis following unspecified cerebrovascular disease affecting unspecified side: Secondary | ICD-10-CM | POA: Diagnosis not present

## 2015-02-18 DIAGNOSIS — E11319 Type 2 diabetes mellitus with unspecified diabetic retinopathy without macular edema: Secondary | ICD-10-CM | POA: Diagnosis not present

## 2015-02-18 DIAGNOSIS — I252 Old myocardial infarction: Secondary | ICD-10-CM | POA: Diagnosis not present

## 2015-02-18 DIAGNOSIS — I2510 Atherosclerotic heart disease of native coronary artery without angina pectoris: Secondary | ICD-10-CM | POA: Diagnosis not present

## 2015-02-18 DIAGNOSIS — M810 Age-related osteoporosis without current pathological fracture: Secondary | ICD-10-CM | POA: Diagnosis not present

## 2015-03-02 DIAGNOSIS — L4050 Arthropathic psoriasis, unspecified: Secondary | ICD-10-CM | POA: Diagnosis not present

## 2015-03-02 DIAGNOSIS — Z6834 Body mass index (BMI) 34.0-34.9, adult: Secondary | ICD-10-CM | POA: Diagnosis not present

## 2015-03-16 DIAGNOSIS — L4050 Arthropathic psoriasis, unspecified: Secondary | ICD-10-CM | POA: Diagnosis not present

## 2015-03-16 DIAGNOSIS — Z6834 Body mass index (BMI) 34.0-34.9, adult: Secondary | ICD-10-CM | POA: Diagnosis not present

## 2015-03-25 ENCOUNTER — Other Ambulatory Visit: Payer: Self-pay

## 2015-03-25 MED ORDER — ROSUVASTATIN CALCIUM 10 MG PO TABS: 10.0000 mg | ORAL_TABLET | Freq: Every day | ORAL | Status: DC

## 2015-03-30 DIAGNOSIS — Z79899 Other long term (current) drug therapy: Secondary | ICD-10-CM | POA: Diagnosis not present

## 2015-03-30 DIAGNOSIS — L4050 Arthropathic psoriasis, unspecified: Secondary | ICD-10-CM | POA: Diagnosis not present

## 2015-03-30 DIAGNOSIS — Z6834 Body mass index (BMI) 34.0-34.9, adult: Secondary | ICD-10-CM | POA: Diagnosis not present

## 2015-03-30 DIAGNOSIS — M810 Age-related osteoporosis without current pathological fracture: Secondary | ICD-10-CM | POA: Diagnosis not present

## 2015-04-13 DIAGNOSIS — M19041 Primary osteoarthritis, right hand: Secondary | ICD-10-CM | POA: Diagnosis not present

## 2015-04-13 DIAGNOSIS — M19042 Primary osteoarthritis, left hand: Secondary | ICD-10-CM | POA: Diagnosis not present

## 2015-04-13 DIAGNOSIS — M810 Age-related osteoporosis without current pathological fracture: Secondary | ICD-10-CM | POA: Diagnosis not present

## 2015-04-13 DIAGNOSIS — L4050 Arthropathic psoriasis, unspecified: Secondary | ICD-10-CM | POA: Diagnosis not present

## 2015-04-13 DIAGNOSIS — Z79899 Other long term (current) drug therapy: Secondary | ICD-10-CM | POA: Diagnosis not present

## 2015-04-22 ENCOUNTER — Other Ambulatory Visit: Payer: Self-pay | Admitting: Cardiovascular Disease

## 2015-04-27 DIAGNOSIS — L4050 Arthropathic psoriasis, unspecified: Secondary | ICD-10-CM | POA: Diagnosis not present

## 2015-04-27 DIAGNOSIS — Z6835 Body mass index (BMI) 35.0-35.9, adult: Secondary | ICD-10-CM | POA: Diagnosis not present

## 2015-05-05 DIAGNOSIS — Z111 Encounter for screening for respiratory tuberculosis: Secondary | ICD-10-CM | POA: Diagnosis not present

## 2015-05-11 DIAGNOSIS — L4050 Arthropathic psoriasis, unspecified: Secondary | ICD-10-CM | POA: Diagnosis not present

## 2015-05-11 DIAGNOSIS — Z6835 Body mass index (BMI) 35.0-35.9, adult: Secondary | ICD-10-CM | POA: Diagnosis not present

## 2015-05-25 DIAGNOSIS — L4050 Arthropathic psoriasis, unspecified: Secondary | ICD-10-CM | POA: Diagnosis not present

## 2015-05-25 DIAGNOSIS — Z6835 Body mass index (BMI) 35.0-35.9, adult: Secondary | ICD-10-CM | POA: Diagnosis not present

## 2015-06-08 DIAGNOSIS — L4050 Arthropathic psoriasis, unspecified: Secondary | ICD-10-CM | POA: Diagnosis not present

## 2015-06-08 DIAGNOSIS — Z6835 Body mass index (BMI) 35.0-35.9, adult: Secondary | ICD-10-CM | POA: Diagnosis not present

## 2015-06-22 DIAGNOSIS — L4050 Arthropathic psoriasis, unspecified: Secondary | ICD-10-CM | POA: Diagnosis not present

## 2015-07-06 DIAGNOSIS — Z6835 Body mass index (BMI) 35.0-35.9, adult: Secondary | ICD-10-CM | POA: Diagnosis not present

## 2015-07-06 DIAGNOSIS — L4050 Arthropathic psoriasis, unspecified: Secondary | ICD-10-CM | POA: Diagnosis not present

## 2015-07-07 ENCOUNTER — Other Ambulatory Visit: Payer: Self-pay | Admitting: *Deleted

## 2015-07-07 MED ORDER — CLOPIDOGREL BISULFATE 75 MG PO TABS: 75.0000 mg | ORAL_TABLET | Freq: Every day | ORAL | Status: DC

## 2015-07-07 MED ORDER — ROSUVASTATIN CALCIUM 10 MG PO TABS: 10.0000 mg | ORAL_TABLET | Freq: Every day | ORAL | Status: DC

## 2015-07-10 ENCOUNTER — Other Ambulatory Visit: Payer: Self-pay | Admitting: Cardiovascular Disease

## 2015-07-20 DIAGNOSIS — L4050 Arthropathic psoriasis, unspecified: Secondary | ICD-10-CM | POA: Diagnosis not present

## 2015-08-03 DIAGNOSIS — Z6834 Body mass index (BMI) 34.0-34.9, adult: Secondary | ICD-10-CM | POA: Diagnosis not present

## 2015-08-03 DIAGNOSIS — L4050 Arthropathic psoriasis, unspecified: Secondary | ICD-10-CM | POA: Diagnosis not present

## 2015-08-17 DIAGNOSIS — Z6835 Body mass index (BMI) 35.0-35.9, adult: Secondary | ICD-10-CM | POA: Diagnosis not present

## 2015-08-17 DIAGNOSIS — L4050 Arthropathic psoriasis, unspecified: Secondary | ICD-10-CM | POA: Diagnosis not present

## 2015-08-25 DIAGNOSIS — E11319 Type 2 diabetes mellitus with unspecified diabetic retinopathy without macular edema: Secondary | ICD-10-CM | POA: Diagnosis not present

## 2015-08-25 DIAGNOSIS — H40013 Open angle with borderline findings, low risk, bilateral: Secondary | ICD-10-CM | POA: Diagnosis not present

## 2015-08-25 DIAGNOSIS — H524 Presbyopia: Secondary | ICD-10-CM | POA: Diagnosis not present

## 2015-08-25 DIAGNOSIS — E119 Type 2 diabetes mellitus without complications: Secondary | ICD-10-CM | POA: Diagnosis not present

## 2015-08-31 DIAGNOSIS — Z23 Encounter for immunization: Secondary | ICD-10-CM | POA: Diagnosis not present

## 2015-08-31 DIAGNOSIS — L4050 Arthropathic psoriasis, unspecified: Secondary | ICD-10-CM | POA: Diagnosis not present

## 2015-08-31 DIAGNOSIS — Z6834 Body mass index (BMI) 34.0-34.9, adult: Secondary | ICD-10-CM | POA: Diagnosis not present

## 2015-09-14 DIAGNOSIS — I10 Essential (primary) hypertension: Secondary | ICD-10-CM | POA: Diagnosis not present

## 2015-09-14 DIAGNOSIS — M810 Age-related osteoporosis without current pathological fracture: Secondary | ICD-10-CM | POA: Diagnosis not present

## 2015-09-14 DIAGNOSIS — M170 Bilateral primary osteoarthritis of knee: Secondary | ICD-10-CM | POA: Diagnosis not present

## 2015-09-14 DIAGNOSIS — E1139 Type 2 diabetes mellitus with other diabetic ophthalmic complication: Secondary | ICD-10-CM | POA: Diagnosis not present

## 2015-09-14 DIAGNOSIS — L4050 Arthropathic psoriasis, unspecified: Secondary | ICD-10-CM | POA: Diagnosis not present

## 2015-09-14 DIAGNOSIS — Z6834 Body mass index (BMI) 34.0-34.9, adult: Secondary | ICD-10-CM | POA: Diagnosis not present

## 2015-09-14 DIAGNOSIS — R8290 Unspecified abnormal findings in urine: Secondary | ICD-10-CM | POA: Diagnosis not present

## 2015-09-14 DIAGNOSIS — Z09 Encounter for follow-up examination after completed treatment for conditions other than malignant neoplasm: Secondary | ICD-10-CM | POA: Diagnosis not present

## 2015-09-15 ENCOUNTER — Other Ambulatory Visit: Payer: Self-pay | Admitting: Cardiovascular Disease

## 2015-09-16 ENCOUNTER — Other Ambulatory Visit: Payer: Self-pay

## 2015-09-16 ENCOUNTER — Other Ambulatory Visit: Payer: Self-pay | Admitting: *Deleted

## 2015-09-16 MED ORDER — ROSUVASTATIN CALCIUM 10 MG PO TABS: 10.0000 mg | ORAL_TABLET | Freq: Every day | ORAL | Status: DC

## 2015-09-16 NOTE — Telephone Encounter (Signed)
losartan-hydrochlorothiazide (HYZAAR) 100-25 MG per tablet 90 tablet 2 12/28/2014     Sig:  TAKE 1 TABLET DAILY    Class:  Normal    DAW:  No    Authorizing Provider:  Wendall Stade, MD    Ordering User:  Greta Doom

## 2015-09-17 ENCOUNTER — Other Ambulatory Visit: Payer: Self-pay

## 2015-09-17 NOTE — Telephone Encounter (Signed)
Approved      Disp Refills Start End    clopidogrel (PLAVIX) 75 MG tablet 90 tablet 0 07/07/2015     Sig - Route:  Take 1 tablet (75 mg total) by mouth daily. - Oral    Class:  Normal    DAW:  No    Authorizing Elinda Bunten:  Wendall Stade, MD    Ordering User:  Valrie Hart, CMA

## 2015-09-22 DIAGNOSIS — E11319 Type 2 diabetes mellitus with unspecified diabetic retinopathy without macular edema: Secondary | ICD-10-CM | POA: Diagnosis not present

## 2015-09-22 DIAGNOSIS — I252 Old myocardial infarction: Secondary | ICD-10-CM | POA: Diagnosis not present

## 2015-09-22 DIAGNOSIS — I10 Essential (primary) hypertension: Secondary | ICD-10-CM | POA: Diagnosis not present

## 2015-09-22 DIAGNOSIS — N183 Chronic kidney disease, stage 3 (moderate): Secondary | ICD-10-CM | POA: Diagnosis not present

## 2015-09-22 DIAGNOSIS — I6529 Occlusion and stenosis of unspecified carotid artery: Secondary | ICD-10-CM | POA: Diagnosis not present

## 2015-09-22 DIAGNOSIS — L4050 Arthropathic psoriasis, unspecified: Secondary | ICD-10-CM | POA: Diagnosis not present

## 2015-09-22 DIAGNOSIS — Z6834 Body mass index (BMI) 34.0-34.9, adult: Secondary | ICD-10-CM | POA: Diagnosis not present

## 2015-09-22 DIAGNOSIS — E785 Hyperlipidemia, unspecified: Secondary | ICD-10-CM | POA: Diagnosis not present

## 2015-09-22 DIAGNOSIS — I69953 Hemiplegia and hemiparesis following unspecified cerebrovascular disease affecting right non-dominant side: Secondary | ICD-10-CM | POA: Diagnosis not present

## 2015-09-22 DIAGNOSIS — Z1389 Encounter for screening for other disorder: Secondary | ICD-10-CM | POA: Diagnosis not present

## 2015-09-22 DIAGNOSIS — I2510 Atherosclerotic heart disease of native coronary artery without angina pectoris: Secondary | ICD-10-CM | POA: Diagnosis not present

## 2015-09-22 DIAGNOSIS — E1139 Type 2 diabetes mellitus with other diabetic ophthalmic complication: Secondary | ICD-10-CM | POA: Diagnosis not present

## 2015-09-23 ENCOUNTER — Other Ambulatory Visit: Payer: Self-pay | Admitting: *Deleted

## 2015-09-23 MED ORDER — LOSARTAN POTASSIUM-HCTZ 100-25 MG PO TABS
1.0000 | ORAL_TABLET | Freq: Every day | ORAL | Status: DC
Start: 2015-09-23 — End: 2015-11-25

## 2015-09-28 DIAGNOSIS — L4050 Arthropathic psoriasis, unspecified: Secondary | ICD-10-CM | POA: Diagnosis not present

## 2015-09-28 DIAGNOSIS — Z6834 Body mass index (BMI) 34.0-34.9, adult: Secondary | ICD-10-CM | POA: Diagnosis not present

## 2015-09-29 DIAGNOSIS — H40013 Open angle with borderline findings, low risk, bilateral: Secondary | ICD-10-CM | POA: Diagnosis not present

## 2015-10-12 DIAGNOSIS — L4050 Arthropathic psoriasis, unspecified: Secondary | ICD-10-CM | POA: Diagnosis not present

## 2015-10-21 DIAGNOSIS — E113299 Type 2 diabetes mellitus with mild nonproliferative diabetic retinopathy without macular edema, unspecified eye: Secondary | ICD-10-CM | POA: Diagnosis not present

## 2015-10-21 DIAGNOSIS — E119 Type 2 diabetes mellitus without complications: Secondary | ICD-10-CM | POA: Diagnosis not present

## 2015-10-21 DIAGNOSIS — H35033 Hypertensive retinopathy, bilateral: Secondary | ICD-10-CM | POA: Diagnosis not present

## 2015-10-21 DIAGNOSIS — H40013 Open angle with borderline findings, low risk, bilateral: Secondary | ICD-10-CM | POA: Diagnosis not present

## 2015-10-21 DIAGNOSIS — H43811 Vitreous degeneration, right eye: Secondary | ICD-10-CM | POA: Diagnosis not present

## 2015-10-21 DIAGNOSIS — E113592 Type 2 diabetes mellitus with proliferative diabetic retinopathy without macular edema, left eye: Secondary | ICD-10-CM | POA: Diagnosis not present

## 2015-10-21 DIAGNOSIS — H4311 Vitreous hemorrhage, right eye: Secondary | ICD-10-CM | POA: Diagnosis not present

## 2015-10-21 DIAGNOSIS — H2513 Age-related nuclear cataract, bilateral: Secondary | ICD-10-CM | POA: Diagnosis not present

## 2015-10-25 DIAGNOSIS — H4311 Vitreous hemorrhage, right eye: Secondary | ICD-10-CM | POA: Diagnosis not present

## 2015-10-25 DIAGNOSIS — H3562 Retinal hemorrhage, left eye: Secondary | ICD-10-CM | POA: Diagnosis not present

## 2015-10-25 DIAGNOSIS — H33321 Round hole, right eye: Secondary | ICD-10-CM | POA: Diagnosis not present

## 2015-10-26 DIAGNOSIS — L4050 Arthropathic psoriasis, unspecified: Secondary | ICD-10-CM | POA: Diagnosis not present

## 2015-10-26 DIAGNOSIS — Z6834 Body mass index (BMI) 34.0-34.9, adult: Secondary | ICD-10-CM | POA: Diagnosis not present

## 2015-11-09 DIAGNOSIS — L4050 Arthropathic psoriasis, unspecified: Secondary | ICD-10-CM | POA: Diagnosis not present

## 2015-11-09 DIAGNOSIS — Z6834 Body mass index (BMI) 34.0-34.9, adult: Secondary | ICD-10-CM | POA: Diagnosis not present

## 2015-11-22 DIAGNOSIS — J209 Acute bronchitis, unspecified: Secondary | ICD-10-CM | POA: Diagnosis not present

## 2015-11-22 DIAGNOSIS — Z6834 Body mass index (BMI) 34.0-34.9, adult: Secondary | ICD-10-CM | POA: Diagnosis not present

## 2015-11-22 DIAGNOSIS — I10 Essential (primary) hypertension: Secondary | ICD-10-CM | POA: Diagnosis not present

## 2015-11-22 DIAGNOSIS — J029 Acute pharyngitis, unspecified: Secondary | ICD-10-CM | POA: Diagnosis not present

## 2015-11-22 DIAGNOSIS — L4050 Arthropathic psoriasis, unspecified: Secondary | ICD-10-CM | POA: Diagnosis not present

## 2015-11-22 DIAGNOSIS — E1139 Type 2 diabetes mellitus with other diabetic ophthalmic complication: Secondary | ICD-10-CM | POA: Diagnosis not present

## 2015-11-22 DIAGNOSIS — H6122 Impacted cerumen, left ear: Secondary | ICD-10-CM | POA: Diagnosis not present

## 2015-11-22 DIAGNOSIS — H9192 Unspecified hearing loss, left ear: Secondary | ICD-10-CM | POA: Diagnosis not present

## 2015-11-22 DIAGNOSIS — R05 Cough: Secondary | ICD-10-CM | POA: Diagnosis not present

## 2015-11-25 ENCOUNTER — Encounter: Payer: Self-pay | Admitting: Cardiovascular Disease

## 2015-11-25 NOTE — Progress Notes (Signed)
Patient ID: Margaret Hobbs, female   DOB: 1951/05/09, 64 y.o.   MRN: [id number] Sharilynn is seen today for F/U of elevated lipids, HTN and CAD she had her last cath in 05/2007 and had a patent stent to the RCA which was placed in January of 2008. She has not had any SSCP. Her BP has been under good controll. She sees Dr. Clelia Croft for blood work in a couple of weeks. She is on humera for psoriasis and would be considered immunocompromized. She already got a flue shot. She needs a refill on her meds through [facility] . She is a bit depressed again this am. She has gained some weight. She has had a previous CVA with normal carotids and has a hard time with some LLE weakness   Bronchitis and cough on cephdrine    ROS: Denies fever, malais, weight loss, blurry vision, decreased visual acuity, cough, sputum, SOB, hemoptysis, pleuritic pain, palpitaitons, heartburn, abdominal pain, melena, lower extremity edema, claudication, or rash.  All other systems reviewed and negative  General: Affect appropriate Healthy:  appears stated age HEENT: normal Neck supple with no adenopathy JVP normal no bruits no thyromegaly Lungs clear with no wheezing and good diaphragmatic motion Heart:  S1/S2 no murmur, no rub, gallop or click PMI normal Abdomen: benighn, BS positve, no tenderness, no AAA no bruit.  No HSM or HJR Distal pulses intact with no bruits No edema Neuro non-focal Skin warm and dry No muscular weakness   Current Outpatient Prescriptions  Medication Sig Dispense Refill  . adalimumab (HUMIRA) 40 MG/0.8ML injection Inject 40 mg into the skin every 14 (fourteen) days.      . Ascorbic Acid (VITAMIN C) 500 MG tablet Take 500 mg by mouth daily.      [name] aspirin 81 MG tablet Take 81 mg by mouth daily.      . cholecalciferol (VITAMIN D) 1000 UNITS tablet Take 2,000 Units by mouth daily.    . clopidogrel (PLAVIX) 75 MG tablet Take 1 tablet (75 mg total) by mouth daily. 90 tablet 0  . co-enzyme Q-10 30 MG  capsule Take 30 mg by mouth daily.     . cyanocobalamin 1000 MCG tablet Take 1,000 mcg by mouth daily.     . folic acid (FOLVITE) 1 MG tablet Take 1 mg by mouth daily.      [name] glipiZIDE-metformin (METAGLIP) 5-500 MG per tablet Take 1 tablet by mouth 2 (two) times daily before a meal.      . hydrALAZINE (APRESOLINE) 50 MG tablet Take 1 tablet (50 mg total) by mouth 2 (two) times daily. 180 tablet 3  . losartan-hydrochlorothiazide (HYZAAR) 100-25 MG tablet Take 1 tablet by mouth daily. 90 tablet 0  . metoprolol succinate (TOPROL-XL) 100 MG 24 hr tablet Take 100 mg by mouth daily. Take with or immediately following a meal.    . nitroGLYCERIN (NITROSTAT) 0.4 MG SL tablet Place 0.4 mg under the tongue every 5 (five) minutes as needed for chest pain (3 doses max).    . rosuvastatin (CRESTOR) 10 MG tablet Take 1 tablet (10 mg total) by mouth daily. 90 tablet 0  . vitamin A 78295 UNIT capsule Take 10,000 Units by mouth daily.      . vitamin E 400 UNIT capsule Take 400 Units by mouth daily.      . zoledronic acid (RECLAST) 5 MG/100ML SOLN injection Inject 5 mg into the vein once.     No current facility-administered medications for this visit.  Allergies  Remicade and Sulfa antibiotics  Electrocardiogram:  SR rate 72 possible old anterior MI no change from 2014  11/26/15  SR rate 78 PVC old IMI    Assessment and Plan HTN:  Well controlled.  Continue current medications and low sodium Dash type diet.   CAD: Stent to RCA in 2008 Chol: On crestor labs with primary  LK:[id number]:Discussed low carb diet.  Target hemoglobin A1c is 6.5 or less.  Continue current medications. CVA: Duplex less than 40% bilateral ICA stenosis 12/2014  Continue ASA/Plavix  Depression: stable f/u Dr Eleonore ChiquitoShaw    Glender [place]

## 2015-11-26 ENCOUNTER — Ambulatory Visit (INDEPENDENT_AMBULATORY_CARE_PROVIDER_SITE_OTHER): Payer: Medicare Other | Admitting: Cardiovascular Disease

## 2015-11-26 ENCOUNTER — Encounter: Payer: Self-pay | Admitting: Cardiovascular Disease

## 2015-11-26 VITALS — BP 116/60 | HR 78 | Ht 62.0 in | Wt 206.1 lb

## 2015-11-26 DIAGNOSIS — I10 Essential (primary) hypertension: Secondary | ICD-10-CM

## 2015-11-26 DIAGNOSIS — I2510 Atherosclerotic heart disease of native coronary artery without angina pectoris: Secondary | ICD-10-CM | POA: Diagnosis not present

## 2015-11-26 DIAGNOSIS — I6523 Occlusion and stenosis of bilateral carotid arteries: Secondary | ICD-10-CM

## 2015-11-26 MED ORDER — METOPROLOL SUCCINATE ER 100 MG PO TB24: 100.0000 mg | ORAL_TABLET | Freq: Every day | ORAL | Status: DC

## 2015-11-26 MED ORDER — CLOPIDOGREL BISULFATE 75 MG PO TABS: 75.0000 mg | ORAL_TABLET | Freq: Every day | ORAL | Status: DC

## 2015-11-26 MED ORDER — ROSUVASTATIN CALCIUM 10 MG PO TABS: 10.0000 mg | ORAL_TABLET | Freq: Every day | ORAL | Status: DC

## 2015-11-26 MED ORDER — LOSARTAN POTASSIUM-HCTZ 100-25 MG PO TABS: 1.0000 | ORAL_TABLET | Freq: Every day | ORAL | Status: DC

## 2015-11-26 NOTE — Patient Instructions (Signed)

## 2015-12-07 DIAGNOSIS — Z79899 Other long term (current) drug therapy: Secondary | ICD-10-CM | POA: Diagnosis not present

## 2015-12-07 DIAGNOSIS — L4050 Arthropathic psoriasis, unspecified: Secondary | ICD-10-CM | POA: Diagnosis not present

## 2015-12-07 DIAGNOSIS — M810 Age-related osteoporosis without current pathological fracture: Secondary | ICD-10-CM | POA: Diagnosis not present

## 2015-12-21 DIAGNOSIS — M47817 Spondylosis without myelopathy or radiculopathy, lumbosacral region: Secondary | ICD-10-CM | POA: Diagnosis not present

## 2015-12-21 DIAGNOSIS — Z6834 Body mass index (BMI) 34.0-34.9, adult: Secondary | ICD-10-CM | POA: Diagnosis not present

## 2015-12-21 DIAGNOSIS — L4050 Arthropathic psoriasis, unspecified: Secondary | ICD-10-CM | POA: Diagnosis not present

## 2015-12-21 DIAGNOSIS — M545 Low back pain: Secondary | ICD-10-CM | POA: Diagnosis not present

## 2015-12-21 DIAGNOSIS — M47816 Spondylosis without myelopathy or radiculopathy, lumbar region: Secondary | ICD-10-CM | POA: Diagnosis not present

## 2015-12-26 HISTORY — PX: CATARACT EXTRACTION W/PHACO: SHX586

## 2016-01-12 ENCOUNTER — Encounter: Payer: Self-pay | Admitting: Family

## 2016-01-14 ENCOUNTER — Telehealth: Payer: Self-pay

## 2016-01-14 NOTE — Telephone Encounter (Signed)
Prior auth for Rosuvastatin  sent to [facility].

## 2016-01-17 ENCOUNTER — Telehealth: Payer: Self-pay

## 2016-01-17 NOTE — Telephone Encounter (Signed)
Request for Tier reduction of Rosuvastatin denied by [employer]. She has only tried and failed one preferred alternative.

## 2016-01-18 ENCOUNTER — Encounter: Payer: Self-pay | Admitting: Cardiovascular Disease

## 2016-01-20 ENCOUNTER — Ambulatory Visit (INDEPENDENT_AMBULATORY_CARE_PROVIDER_SITE_OTHER): Payer: Medicare Other | Admitting: Family

## 2016-01-20 ENCOUNTER — Encounter: Payer: Self-pay | Admitting: Family

## 2016-01-20 ENCOUNTER — Ambulatory Visit (HOSPITAL_COMMUNITY)
Admission: RE | Admit: 2016-01-20 | Discharge: 2016-01-20 | Disposition: A | Discharge status: Home / Self Care | Payer: Medicare Other | Source: Ambulatory Visit | Attending: Family | Admitting: Family

## 2016-01-20 VITALS — BP 136/72 | HR 67 | Temp 97.9°F | Resp 16 | Ht 62.0 in | Wt 207.0 lb

## 2016-01-20 DIAGNOSIS — E7800 Pure hypercholesterolemia, unspecified: Secondary | ICD-10-CM | POA: Diagnosis not present

## 2016-01-20 DIAGNOSIS — I6523 Occlusion and stenosis of bilateral carotid arteries: Secondary | ICD-10-CM

## 2016-01-20 DIAGNOSIS — E785 Hyperlipidemia, unspecified: Secondary | ICD-10-CM | POA: Diagnosis not present

## 2016-01-20 DIAGNOSIS — Z8673 Personal history of transient ischemic attack (TIA), and cerebral infarction without residual deficits: Secondary | ICD-10-CM | POA: Diagnosis not present

## 2016-01-20 DIAGNOSIS — E119 Type 2 diabetes mellitus without complications: Secondary | ICD-10-CM | POA: Diagnosis not present

## 2016-01-20 DIAGNOSIS — E669 Obesity, unspecified: Secondary | ICD-10-CM | POA: Diagnosis not present

## 2016-01-20 DIAGNOSIS — I10 Essential (primary) hypertension: Secondary | ICD-10-CM | POA: Diagnosis not present

## 2016-01-20 NOTE — Progress Notes (Signed)
Chief Complaint: Extracranial Carotid Artery Stenosis   History of Present Illness  Margaret Hobbs is a 65 y.o. female patient that Dr. Arbie Cookey has been following for known carotid stenosis. She returns today for follow up.  January 2008 she had an MI, had a stent placed, June, 2008 had a stroke as manifested by right hemiplegia and slurred speech; denies monocular blindness. Her only residual neurological deficits are right leg weakness.  Pt denies any further stroke or TIA symptoms since 2008. Pt denies any steal symptoms in arms/hands.  Patient has not had previous carotid artery intervention.  She reports that she will need cataract surgery soon. She denies claudication symptoms, denies non-healing wounds.  Pt Diabetic: Yes,pt states last A1C was 5.7 which is in the prediabetes range Pt smoker: non-smoker  Pt meds include: Statin : Yes Betablocker: Yes ASA: Yes Other anticoagulants/antiplatelets: Plavix    Past Medical History  Diagnosis Date  . TIA (transient ischemic attack)   . UTI (urinary tract infection)   . HLD (hyperlipidemia)   . Cerebrovascular disease   . Aphasia   . DM2 (diabetes mellitus, type 2) (HCC)   . CVA (cerebral vascular accident) (HCC)   . Hyperparathyroidism   . Arthritis   . HTN (hypertension)   . Hypercholesteremia   . MI (myocardial infarction) (HCC)   . CAD (coronary artery disease)   . Depressed   . Diabetes mellitus   . Osteoporosis     Social History Social History  Substance Use Topics  . Smoking status: Never Smoker   . Smokeless tobacco: Never Used  . Alcohol Use: No    Family History Family History  Problem Relation Age of Onset  . Diabetes    . Hypertension    . Alcohol abuse    . Coronary artery disease    . Heart disease Mother     pacemaker  . Hypertension Mother   . Hyperlipidemia Mother   . Diabetes Brother   . Hypertension Son     Surgical History Past Surgical History  Procedure  Laterality Date  . Hysterectomy - unknown type    . Abdominal hysterectomy      Allergies  Allergen Reactions  . Remicade [Infliximab] Other (See Comments)    Convulsion and felt cold   . Sulfa Antibiotics Rash    Current Outpatient Prescriptions  Medication Sig Dispense Refill  . adalimumab (HUMIRA) 40 MG/0.8ML injection Inject 40 mg into the skin every 14 (fourteen) days.      . Ascorbic Acid (VITAMIN C) 500 MG tablet Take 500 mg by mouth daily.      [name] aspirin 81 MG tablet Take 81 mg by mouth daily.      . cholecalciferol (VITAMIN D) 1000 UNITS tablet Take 2,000 Units by mouth daily.    . clopidogrel (PLAVIX) 75 MG tablet Take 1 tablet (75 mg total) by mouth daily. 90 tablet 3  . co-enzyme Q-10 30 MG capsule Take 30 mg by mouth daily.     . cyanocobalamin 1000 MCG tablet Take 1,000 mcg by mouth daily.     . folic acid (FOLVITE) 1 MG tablet Take 1 mg by mouth daily.      [name] glipiZIDE-metformin (METAGLIP) 5-500 MG per tablet Take 1 tablet by mouth 2 (two) times daily before a meal.      . hydrALAZINE (APRESOLINE) 50 MG tablet Take 1 tablet (50 mg total) by mouth 2 (two) times daily. 180 tablet 3  . losartan-hydrochlorothiazide (HYZAAR) 100-25 MG  tablet Take 1 tablet by mouth daily. 90 tablet 3  . metoprolol succinate (TOPROL-XL) 100 MG 24 hr tablet Take 100 mg by mouth daily. Take with or immediately following a meal.    . metoprolol succinate (TOPROL-XL) 100 MG 24 hr tablet Take 1 tablet (100 mg total) by mouth daily. Take with or immediately following a meal. 90 tablet 3  . nitroGLYCERIN (NITROSTAT) 0.4 MG SL tablet Place 0.4 mg under the tongue every 5 (five) minutes as needed for chest pain (3 doses max).    . rosuvastatin (CRESTOR) 10 MG tablet Take 1 tablet (10 mg total) by mouth daily. 90 tablet 3  . vitamin A 16109 UNIT capsule Take 10,000 Units by mouth daily.      . vitamin E 400 UNIT capsule Take 400 Units by mouth daily.      . zoledronic acid (RECLAST) 5 MG/100ML SOLN  injection Inject 5 mg into the vein once.     No current facility-administered medications for this visit.    Review of Systems : See HPI for pertinent positives and negatives.  Physical Examination  Filed Vitals:   01/20/16 0932 01/20/16 0936  BP: 126/64 136/72  Pulse: 69 67  Temp:  97.9 F (36.6 C)  TempSrc:  Oral  Resp:  16  Height:   (1.575 m)  Weight:  207 lb (93.895 kg)  SpO2:  96%   Body mass index is 37.85 kg/(m^2).   General: WDWN obese female in NAD GAIT: mild limp Eyes: PERRLA Pulmonary: CTAB, no rales, rhonchi, or wheezing.  Cardiac: regular rhythm, no detected murmur.  VASCULAR EXAM Carotid Bruits Left Right   negative Negative   Aorta is not palpable. Radial pulses are 2+ palpable and equal.      LE Pulses LEFT RIGHT   POPLITEAL not palpable  not palpable   POSTERIOR TIBIAL  not palpable   palpable    DORSALIS PEDIS  ANTERIOR TIBIAL palpable   palpable     Gastrointestinal: soft, nontender, BS WNL, no r/g, no palpable masses.  Musculoskeletal: Negative muscle atrophy/wasting. M/S 5/5 in UE's, 4/5 in LLE, 3/5 in RLE, Extremities without ischemic changes.  Neurologic: A&O X 3; Appropriate Affect, Speech is normal CN 2-12 intact, Pain and light touch intact in extremities, Motor exam as listed above.         Non-Invasive Vascular Imaging CAROTID DUPLEX 01/20/2016   Less than 40% bilateral extracranial internal carotid artery stenosis. No significant stenosis of the bilateral external carotid arteries or common carotid arteries. Bilateral vertebral artery is antegrade.  No significant change compared to exam of 01/14/15.   Assessment: Margaret Hobbs is a 65 y.o. female who had a stroke in 2008, none subsequently. She has residual right leg  weakness. Today's carotid duplex suggests less than 40% bilateral extracranial internal carotid artery stenosis. No significant stenosis of the bilateral external carotid arteries or common carotid arteries. Bilateral vertebral artery is antegrade.  No significant change compared to exam of 01/14/15.  Fortunately her DM remains in excellent control and she has never used tobacco. Her atherosclerotic risk factors include obesity and relatively sedentary lifestyle. She states sh has been thinking about taking advantage of joining a gym as part of her [employer] benefit.    Plan: Follow-up in 1 year with Carotid Duplex scan.   I discussed in depth with the patient the nature of atherosclerosis, and emphasized the importance of maximal medical management including strict control of blood pressure, blood glucose,  and lipid levels, obtaining regular exercise, and continued cessation of smoking.  The patient is aware that without maximal medical management the underlying atherosclerotic disease process will progress, limiting the benefit of any interventions. The patient was given information about stroke prevention and what symptoms should prompt the patient to seek immediate medical care. Thank you for allowing [place] to participate in this patient's care.  Charisse March, RN, MSN, FNP-C [facility] Office: [phone]  Clinic Physician: Darrick Penna  01/20/2016 9:38 AM

## 2016-01-20 NOTE — Addendum Note (Signed)
Addended by: Adria Dill L on: 01/20/2016 01:17 PM   Modules accepted: Orders

## 2016-01-20 NOTE — Patient Instructions (Signed)
Stroke Prevention Some medical conditions and behaviors are associated with an increased chance of having a stroke. You may prevent a stroke by making healthy choices and managing medical conditions. HOW CAN I REDUCE MY RISK OF HAVING A STROKE?   Stay physically active. Get at least 30 minutes of activity on most or all days.  Do not smoke. It may also be helpful to avoid exposure to secondhand smoke.  Limit alcohol use. Moderate alcohol use is considered to be:  No more than 2 drinks per day for men.  No more than 1 drink per day for nonpregnant women.  Eat healthy foods. This involves:  Eating 5 or more servings of fruits and vegetables a day.  Making dietary changes that address high blood pressure (hypertension), high cholesterol, diabetes, or obesity.  Manage your cholesterol levels.  Making food choices that are high in fiber and low in saturated fat, trans fat, and cholesterol may control cholesterol levels.  Take any prescribed medicines to control cholesterol as directed by your health care provider.  Manage your diabetes.  Controlling your carbohydrate and sugar intake is recommended to manage diabetes.  Take any prescribed medicines to control diabetes as directed by your health care provider.  Control your hypertension.  Making food choices that are low in salt (sodium), saturated fat, trans fat, and cholesterol is recommended to manage hypertension.  Ask your health care provider if you need treatment to lower your blood pressure. Take any prescribed medicines to control hypertension as directed by your health care provider.  If you are 18-39 years of age, have your blood pressure checked every 3-5 years. If you are 40 years of age or older, have your blood pressure checked every year.  Maintain a healthy weight.  Reducing calorie intake and making food choices that are low in sodium, saturated fat, trans fat, and cholesterol are recommended to manage  weight.  Stop drug abuse.  Avoid taking birth control pills.  Talk to your health care provider about the risks of taking birth control pills if you are over 35 years old, smoke, get migraines, or have ever had a blood clot.  Get evaluated for sleep disorders (sleep apnea).  Talk to your health care provider about getting a sleep evaluation if you snore a lot or have excessive sleepiness.  Take medicines only as directed by your health care provider.  For some people, aspirin or blood thinners (anticoagulants) are helpful in reducing the risk of forming abnormal blood clots that can lead to stroke. If you have the irregular heart rhythm of atrial fibrillation, you should be on a blood thinner unless there is a good reason you cannot take them.  Understand all your medicine instructions.  Make sure that other conditions (such as anemia or atherosclerosis) are addressed. SEEK IMMEDIATE MEDICAL CARE IF:   You have sudden weakness or numbness of the face, arm, or leg, especially on one side of the body.  Your face or eyelid droops to one side.  You have sudden confusion.  You have trouble speaking (aphasia) or understanding.  You have sudden trouble seeing in one or both eyes.  You have sudden trouble walking.  You have dizziness.  You have a loss of balance or coordination.  You have a sudden, severe headache with no known cause.  You have new chest pain or an irregular heartbeat. Any of these symptoms may represent a serious problem that is an emergency. Do not wait to see if the symptoms will   go away. Get medical help at once. Call your local emergency services (911 in [place] Do not drive yourself to the hospital.   This information is not intended to replace advice given to you by your health care provider. Make sure you discuss any questions you have with your health care provider.   Document Released: 01/18/2005 Document Revised: 01/01/2015 Document Reviewed:  06/13/2013 Elsevier Interactive Patient Education 2016 Elsevier Inc.  

## 2016-01-25 ENCOUNTER — Telehealth: Payer: Self-pay | Admitting: *Deleted

## 2016-01-25 DIAGNOSIS — Z79899 Other long term (current) drug therapy: Secondary | ICD-10-CM

## 2016-01-25 MED ORDER — ATORVASTATIN CALCIUM 40 MG PO TABS: 40.0000 mg | ORAL_TABLET | Freq: Every day | ORAL | Status: DC

## 2016-01-25 NOTE — Telephone Encounter (Signed)
Called patient about changing to Lipitor 40 mg by mouth daily. Patient agreed to this and will come in on 04/18/16 for lab work.

## 2016-01-25 NOTE — Telephone Encounter (Signed)
Patients insurance will not cover rosuvastatin and they also denied the request for a tier exception. Covered alternatives include lovastatin, pravastatin, atorvastatin or fluvastatin. Please advise. Thanks, MI

## 2016-01-25 NOTE — Telephone Encounter (Signed)
Can call in generic lipitor 40 mg and repeat labs in 3 months

## 2016-02-11 ENCOUNTER — Telehealth: Payer: Self-pay

## 2016-02-11 MED ORDER — SIMVASTATIN 40 MG PO TABS: 40.0000 mg | ORAL_TABLET | Freq: Every day | ORAL | Status: DC

## 2016-02-11 NOTE — Telephone Encounter (Signed)
-----   Message from Wendall Stade, MD sent at 02/11/2016  5:06 PM EST ----- Can call in zocor 40 mg and check labs in 8 weeks  ----- Message -----    From: Henrietta Dine, RN    Sent: 02/11/2016   2:41 PM      To: Wendall Stade, MD  Informed patient of results and verbal understanding expressed.   Patient st since changing from Crestor to Lipitor (this was done for pricing issues), she has experienced diarrhea every other day for a week since she started. She would like other medication recommendations.  To Dr. Eden Emms.

## 2016-02-11 NOTE — Telephone Encounter (Signed)
Per Dr. Eden Emms, instructed patient to STOP LIPITOR and START ZOCOR 40 mg daily. Fasting lab work scheduled 4/25. Patient agrees with treatment plan.

## 2016-03-09 ENCOUNTER — Other Ambulatory Visit (HOSPITAL_COMMUNITY): Payer: Self-pay | Admitting: *Deleted

## 2016-03-10 ENCOUNTER — Ambulatory Visit (HOSPITAL_COMMUNITY)
Admission: RE | Admit: 2016-03-10 | Discharge: 2016-03-10 | Disposition: A | Discharge status: Home / Self Care | Payer: Medicare Other | Source: Ambulatory Visit | Attending: Internal Medicine | Admitting: Internal Medicine

## 2016-03-10 DIAGNOSIS — M810 Age-related osteoporosis without current pathological fracture: Secondary | ICD-10-CM | POA: Insufficient documentation

## 2016-03-10 MED ORDER — ZOLEDRONIC ACID 5 MG/100ML IV SOLN
INTRAVENOUS | Status: AC
  Administered 2016-03-10: 5 mg via INTRAVENOUS
  Filled 2016-03-10: qty 100

## 2016-03-10 MED ORDER — ZOLEDRONIC ACID 5 MG/100ML IV SOLN
5.0000 mg | Freq: Once | INTRAVENOUS | Status: AC
  Administered 2016-03-10: 5 mg via INTRAVENOUS

## 2016-04-06 ENCOUNTER — Other Ambulatory Visit: Payer: Self-pay | Admitting: *Deleted

## 2016-04-07 ENCOUNTER — Other Ambulatory Visit: Payer: Self-pay | Admitting: *Deleted

## 2016-04-07 MED ORDER — CLOPIDOGREL BISULFATE 75 MG PO TABS: 75.0000 mg | ORAL_TABLET | Freq: Every day | ORAL | Status: DC

## 2016-04-07 MED ORDER — METOPROLOL SUCCINATE ER 100 MG PO TB24: 100.0000 mg | ORAL_TABLET | Freq: Every day | ORAL | Status: DC

## 2016-04-07 MED ORDER — LOSARTAN POTASSIUM-HCTZ 100-25 MG PO TABS: 1.0000 | ORAL_TABLET | Freq: Every day | ORAL | Status: DC

## 2016-04-07 MED ORDER — HYDRALAZINE HCL 50 MG PO TABS: 50.0000 mg | ORAL_TABLET | Freq: Two times a day (BID) | ORAL | Status: DC

## 2016-04-18 ENCOUNTER — Other Ambulatory Visit: Payer: Medicare Other

## 2016-06-06 ENCOUNTER — Ambulatory Visit (INDEPENDENT_AMBULATORY_CARE_PROVIDER_SITE_OTHER): Payer: Medicare Other | Admitting: Family Medicine

## 2016-06-06 VITALS — BP 122/70 | HR 76 | Temp 98.1°F | Resp 18

## 2016-06-06 DIAGNOSIS — L4050 Arthropathic psoriasis, unspecified: Secondary | ICD-10-CM

## 2016-06-06 DIAGNOSIS — R1031 Right lower quadrant pain: Secondary | ICD-10-CM

## 2016-06-06 DIAGNOSIS — M79644 Pain in right finger(s): Secondary | ICD-10-CM | POA: Diagnosis not present

## 2016-06-06 LAB — POC MICROSCOPIC URINALYSIS (UMFC): Mucus: ABSENT

## 2016-06-06 LAB — POCT CBC
GRANULOCYTE PERCENT: 71.5 % (ref 37–80)
HEMATOCRIT: 35 % — AB (ref 37.7–47.9)
HEMOGLOBIN: 12.5 g/dL (ref 12.2–16.2)
LYMPH, POC: 1.7 (ref 0.6–3.4)
MCH, POC: 31.1 pg (ref 27–31.2)
MCHC: 35.6 g/dL — AB (ref 31.8–35.4)
MCV: 87.5 fL (ref 80–97)
MID (cbc): 0.5 (ref 0–0.9)
MPV: 8.9 fL (ref 0–99.8)
POC Granulocyte: 5.5 (ref 2–6.9)
POC LYMPH %: 22.2 % (ref 10–50)
POC MID %: 6.3 %M (ref 0–12)
Platelet Count, POC: 280 10*3/uL (ref 142–424)
RBC: 4 M/uL — AB (ref 4.04–5.48)
RDW, POC: 12.8 %
WBC: 7.7 10*3/uL (ref 4.6–10.2)

## 2016-06-06 LAB — POCT URINALYSIS DIP (MANUAL ENTRY)
BILIRUBIN UA: NEGATIVE
BILIRUBIN UA: NEGATIVE
GLUCOSE UA: NEGATIVE
Nitrite, UA: NEGATIVE
PH UA: 7
Protein Ur, POC: NEGATIVE
RBC UA: NEGATIVE
SPEC GRAV UA: 1.015
Urobilinogen, UA: 1

## 2016-06-06 MED ORDER — HYDROCODONE-ACETAMINOPHEN 5-325 MG PO TABS: 0.5000 | ORAL_TABLET | Freq: Four times a day (QID) | ORAL | Status: DC | PRN

## 2016-06-06 NOTE — Patient Instructions (Addendum)
IF you received an x-ray today, you will receive an invoice from [facility]. Please contact [facility] at [phone] with questions or concerns regarding your invoice.   IF you received labwork today, you will receive an invoice from [employer]. Please contact [facility] at [phone] with questions or concerns regarding your invoice.   Our billing staff will not be able to assist you with questions regarding bills from these companies.  You will be contacted with the lab results as soon as they are available. The fastest way to get your results is to activate your My Chart account. Instructions are located on the last page of this paperwork. If you have not heard from [place] regarding the results in 2 weeks, please contact this office.     You can take one half to one of the hydrocodone up to every 6 hours as needed for your thumb pain.  It appears to be likely psoriatic arthritis at this time. Keep follow-up with rheumatologist on Thursday to discuss other treatment options. If you notice any increasing redness, or worsening symptoms prior to that visit, return here or emergency room.  Your abdominal pain appears to be in the muscles or abdominal wall. If symptoms persist, CT scanning may be helpful to look at the abdominal organs as well as to evaluate for a type of hernia. If you do have nausea, vomiting, diarrhea, fever, or symptoms are worsening, return here or other medical provider. Okay to take Aleve up to twice per day as needed for now. If pain is not improving into next week, or worsening sooner, return for recheck.   Abdominal Pain, Adult Many things can cause abdominal pain. Usually, abdominal pain is not caused by a disease and will improve without treatment. It can often be observed and treated at home. Your health care provider will do a physical exam and possibly order blood tests and X-rays to help determine the seriousness of  your pain. However, in many cases, more time must pass before a clear cause of the pain can be found. Before that point, your health care provider may not know if you need more testing or further treatment. HOME CARE INSTRUCTIONS Monitor your abdominal pain for any changes. The following actions may help to alleviate any discomfort you are experiencing:  Only take over-the-counter or prescription medicines as directed by your health care provider.  Do not take laxatives unless directed to do so by your health care provider.  Try a clear liquid diet (broth, tea, or water) as directed by your health care provider. Slowly move to a bland diet as tolerated. SEEK MEDICAL CARE IF:  You have unexplained abdominal pain.  You have abdominal pain associated with nausea or diarrhea.  You have pain when you urinate or have a bowel movement.  You experience abdominal pain that wakes you in the night.  You have abdominal pain that is worsened or improved by eating food.  You have abdominal pain that is worsened with eating fatty foods.  You have a fever. SEEK IMMEDIATE MEDICAL CARE IF:  Your pain does not go away within 2 hours.  You keep throwing up (vomiting).  Your pain is felt only in portions of the abdomen, such as the right side or the left lower portion of the abdomen.  You pass bloody or black tarry stools. MAKE SURE YOU:  Understand these instructions.  Will watch your condition.  Will get help right away if you are not  doing well or get worse.   This information is not intended to replace advice given to you by your health care provider. Make sure you discuss any questions you have with your health care provider.   Document Released: 09/20/2005 Document Revised: 09/01/2015 Document Reviewed: 08/20/2013 Elsevier Interactive Patient Education [employer]2016 Elsevier Inc.

## 2016-06-06 NOTE — Progress Notes (Signed)
By signing my name below I, Shelah Lewandowsky, attest that this documentation has been prepared under the direction and in the presence of Shade Flood, MD. Electonically Signed. Shelah Lewandowsky, Scribe 06/06/2016 at 3:24 PM   Subjective:    Patient ID: Margaret Hobbs, female    DOB: 06-18-1951, 65 y.o.   MRN: [id number]  Chief Complaint  Patient presents with  . Hand Pain    right thumb, x 3 days  . Abdominal Pain    right sided  . Knee Pain    right  . Joint Swelling    pain    HPI Margaret Hobbs is a 65 y.o. female who presents to the [facility] complaining of rt thumb pain for the past 3 days. Pt also reports thumb swelling. Pt states that she woke up from sleep a week ago with bilat hand clenched tight and all fingers bilat were sore afterwards.  Pt has history of psoriatic arthritis which has been controled with humira until 3 months ago when pt lost health insurance and could no longer afford humira. Pt has an appointment with her rheumatologist, Dr Corliss Skains, in 2 days. Pt denies history of gout. Pt reports that she thinks she is developing a psoriasis rash over her rt thumb.  Pt also c/o abd pain for the past 3 days as well. Abd pain is worsened with movement. Pt states pain makes it hard to get out of bed in the mornings. Pt denies N/V/D, fever, or difficultly eating. Pt denies any change in her routine or any recent lifting or twisting or strain. Pt also states she is still using the same bed.   Pt reports mild burning urination a couple weeks ago. Pt denies any dysuria recently. Pt also denies any vaginal discharge or bleeding, hematuria, or blood in stool.   Pt has been taking aleve for both thumb and abd pain with mild relief. Pt has been taking 1 every 12 hrs.   Pt also c/o rt knee pain, and joint swelling.  Pt denies history of colonoscopy.  Pt has history of stroke, DM, HLD, HTN, CAD with stent in 2008.  Pt's PCP is Martha Clan,  MD     Patient Active Problem List   Diagnosis Date Noted  . Type 2 diabetes mellitus (HCC) 03/30/2009  . HYPERCHOLESTEROLEMIA 03/30/2009  . HYPERLIPIDEMIA 03/30/2009  . Essential hypertension 03/30/2009  . MYOCARDIAL INFARCTION 03/30/2009  . Coronary atherosclerosis 03/30/2009  . CVA 03/30/2009  . URINARY TRACT INFECTION 03/30/2009  . ARTHRITIS 03/30/2009  . HYPERPARATHYROIDISM, HX OF 03/30/2009   Past Medical History  Diagnosis Date  . TIA (transient ischemic attack)   . UTI (urinary tract infection)   . HLD (hyperlipidemia)   . Cerebrovascular disease   . Aphasia   . DM2 (diabetes mellitus, type 2) (HCC)   . CVA (cerebral vascular accident) (HCC)   . Hyperparathyroidism   . Arthritis   . HTN (hypertension)   . Hypercholesteremia   . MI (myocardial infarction) (HCC)   . CAD (coronary artery disease)   . Depressed   . Diabetes mellitus   . Osteoporosis    Past Surgical History  Procedure Laterality Date  . Hysterectomy - unknown type    . Abdominal hysterectomy     Allergies  Allergen Reactions  . Remicade [Infliximab] Other (See Comments)    Convulsion and felt cold   . Sulfa Antibiotics Rash   Prior to Admission medications   Medication Sig Start Date End  Date Taking? Authorizing Provider  adalimumab (HUMIRA) 40 MG/0.8ML injection Inject 40 mg into the skin every 14 (fourteen) days.     Yes Historical Provider, MD  Ascorbic Acid (VITAMIN C) 500 MG tablet Take 500 mg by mouth daily.     Yes Historical Provider, MD  aspirin 81 MG tablet Take 81 mg by mouth daily.     Yes Historical Provider, MD  cholecalciferol (VITAMIN D) 1000 UNITS tablet Take 2,000 Units by mouth daily.   Yes Historical Provider, MD  clopidogrel (PLAVIX) 75 MG tablet Take 1 tablet (75 mg total) by mouth daily. 04/07/16  Yes Wendall Stade, MD  co-enzyme Q-10 30 MG capsule Take 30 mg by mouth daily.    Yes Historical Provider, MD  cyanocobalamin 1000 MCG tablet Take 1,000 mcg by mouth daily.     Yes Historical Provider, MD  folic acid (FOLVITE) 1 MG tablet Take 1 mg by mouth daily.     Yes Historical Provider, MD  glipiZIDE-metformin (METAGLIP) 5-500 MG per tablet Take 1 tablet by mouth 2 (two) times daily before a meal.     Yes Historical Provider, MD  hydrALAZINE (APRESOLINE) 50 MG tablet Take 1 tablet (50 mg total) by mouth 2 (two) times daily. 04/07/16  Yes Wendall Stade, MD  losartan-hydrochlorothiazide (HYZAAR) 100-25 MG tablet Take 1 tablet by mouth daily. 04/07/16  Yes Wendall Stade, MD  metoprolol succinate (TOPROL-XL) 100 MG 24 hr tablet Take 1 tablet (100 mg total) by mouth daily. Take with or immediately following a meal. 04/07/16  Yes Wendall Stade, MD  nitroGLYCERIN (NITROSTAT) 0.4 MG SL tablet Place 0.4 mg under the tongue every 5 (five) minutes as needed for chest pain (3 doses max).   Yes Historical Provider, MD  nystatin cream (MYCOSTATIN) Apply 1 application topically 2 (two) times daily.   Yes Historical Provider, MD  simvastatin (ZOCOR) 40 MG tablet Take 1 tablet (40 mg total) by mouth at bedtime. 02/11/16  Yes Wendall Stade, MD  vitamin A 82956 UNIT capsule Take 10,000 Units by mouth daily.     Yes Historical Provider, MD  vitamin E 400 UNIT capsule Take 400 Units by mouth daily.     Yes Historical Provider, MD  zoledronic acid (RECLAST) 5 MG/100ML SOLN injection Inject 5 mg into the vein once.   Yes Historical Provider, MD   Social History   Social History  . Marital Status: Widowed    Spouse Name: N/A  . Number of Children: 1  . Years of Education: N/A   Occupational History  . Not on file.   Social History Main Topics  . Smoking status: Never Smoker   . Smokeless tobacco: Never Used  . Alcohol Use: No  . Drug Use: No  . Sexual Activity: Not on file   Other Topics Concern  . Not on file   Social History Narrative      Review of Systems  Constitutional: Negative for fever, fatigue and unexpected weight change.  Respiratory: Negative for chest  tightness and shortness of breath.   Cardiovascular: Negative for chest pain, palpitations and leg swelling.  Gastrointestinal: Positive for abdominal pain. Negative for nausea, vomiting, diarrhea and blood in stool.  Genitourinary: Negative for dysuria.  Musculoskeletal: Positive for arthralgias (rt thumb and rt knee).  Skin: Positive for rash.  Neurological: Negative for dizziness, syncope, light-headedness and headaches.       Objective:   Physical Exam  Constitutional: She is oriented to person, place, and  time. She appears well-developed and well-nourished.  HENT:  Head: Normocephalic and atraumatic.  Eyes: Conjunctivae and EOM are normal. Pupils are equal, round, and reactive to light.  Neck: Carotid bruit is not present.  Cardiovascular: Normal rate, regular rhythm, normal heart sounds and intact distal pulses.   Pulmonary/Chest: Effort normal and breath sounds normal.  Abdominal: Soft. Normal appearance and bowel sounds are normal. She exhibits no pulsatile midline mass. There is tenderness (minimal in suprapubic, moderate RLQ) in the right lower quadrant and suprapubic area. There is no CVA tenderness and negative Murphy's sign (RUQ non tender). No hernia (none appretiated while sitting up).  Reproduction of abd pain while pt was sitting up.   Musculoskeletal:  Rt hand has swelling and slight warmth of thumb IP joint, skin intact with minimal erythema over IP joint only.  MCP joint on rt hand and CMP joint are non-tender, lateral and proximal nail folds of rt thumb are unaffected, wrist and hand are non-tender.   Neurological: She is alert and oriented to person, place, and time.  Skin: Skin is warm and dry.  Pt has minimal erythema over rt 1st finger IP joint only. Skin intact.  Psychiatric: She has a normal mood and affect. Her behavior is normal.  Vitals reviewed.    Filed Vitals:   06/06/16 1428  BP: 122/70  Pulse: 76  Temp: 98.1 F (36.7 C)  Resp: 18  SpO2: 96%     Results for orders placed or performed in visit on 06/06/16  POCT CBC  Result Value Ref Range   WBC 7.7 4.6 - 10.2 K/uL   Lymph, poc 1.7 0.6 - 3.4   POC LYMPH PERCENT 22.2 10 - 50 %L   MID (cbc) 0.5 0 - 0.9   POC MID % 6.3 0 - 12 %M   POC Granulocyte 5.5 2 - 6.9   Granulocyte percent 71.5 37 - 80 %G   RBC 4.00 (A) 4.04 - 5.48 M/uL   Hemoglobin 12.5 12.2 - 16.2 g/dL   HCT, POC 91.4 (A) 78.2 - 47.9 %   MCV 87.5 80 - 97 fL   MCH, POC 31.1 27 - 31.2 pg   MCHC 35.6 (A) 31.8 - 35.4 g/dL   RDW, POC 95.6 %   Platelet Count, POC 280 142 - 424 K/uL   MPV 8.9 0 - 99.8 fL  POCT urinalysis dipstick  Result Value Ref Range   Color, UA yellow yellow   Clarity, UA clear clear   Glucose, UA negative negative   Bilirubin, UA negative negative   Ketones, POC UA negative negative   Spec Grav, UA 1.015    Blood, UA negative negative   pH, UA 7.0    Protein Ur, POC negative negative   Urobilinogen, UA 1.0    Nitrite, UA Negative Negative   Leukocytes, UA Trace (A) Negative  POCT Microscopic Urinalysis ([facility])  Result Value Ref Range   WBC,UR,HPF,POC None None WBC/hpf   RBC,UR,HPF,POC None None RBC/hpf   Bacteria None None, Too numerous to count   Mucus Absent Absent   Epithelial Cells, UR Per Microscopy Few (A) None, Too numerous to count cells/hpf        Assessment & Plan:   Margaret Hobbs is a 65 y.o. female RLQ abdominal pain - Plan: POCT CBC, POCT urinalysis dipstick, POCT Microscopic Urinalysis ([facility])  - Symptoms reproduced with movement, sitting up, suspect abdominal wall source. Reassuring CBC and urinalysis. No hernia palpated on exam, but differential diagnosis includes spigelian  hernia, or other wall defect, but not appreciated on current exam. Symptomatic care discussed, RTC precautions if persistent or any worsening as in AVS.  Thumb pain, right, Psoriatic arthritis (HCC)  - DIP involvement, off her Humira, suspected flare of psoriatic arthritis. No sign of  infection at present. Start hydrocodone 1/2-1 every 6 hours as needed. Side effects discussed. Keep follow-up in 2 days with her rheumatologist to discuss other medication. RTC precautions if worsening  Meds ordered this encounter  Medications  . nystatin cream (MYCOSTATIN)    Sig: Apply 1 application topically 2 (two) times daily.   Patient Instructions       IF you received an x-ray today, you will receive an invoice from [facility]. Please contact [facility] at [phone] with questions or concerns regarding your invoice.   IF you received labwork today, you will receive an invoice from [employer]. Please contact [facility] at [phone] with questions or concerns regarding your invoice.   Our billing staff will not be able to assist you with questions regarding bills from these companies.  You will be contacted with the lab results as soon as they are available. The fastest way to get your results is to activate your My Chart account. Instructions are located on the last page of this paperwork. If you have not heard from [place] regarding the results in 2 weeks, please contact this office.     You can take one half to one of the hydrocodone up to every 6 hours as needed for your thumb pain.  It appears to be likely psoriatic arthritis at this time. Keep follow-up with rheumatologist on Thursday to discuss other treatment options. If you notice any increasing redness, or worsening symptoms prior to that visit, return here or emergency room.  Your abdominal pain appears to be in the muscles or abdominal wall. If symptoms persist, CT scanning may be helpful to look at the abdominal organs as well as to evaluate for a type of hernia. If you do have nausea, vomiting, diarrhea, fever, or symptoms are worsening, return here or other medical provider. Okay to take Aleve up to twice per day as needed for now. If pain is not improving into next week, or  worsening sooner, return for recheck.   Abdominal Pain, Adult Many things can cause abdominal pain. Usually, abdominal pain is not caused by a disease and will improve without treatment. It can often be observed and treated at home. Your health care provider will do a physical exam and possibly order blood tests and X-rays to help determine the seriousness of your pain. However, in many cases, more time must pass before a clear cause of the pain can be found. Before that point, your health care provider may not know if you need more testing or further treatment. HOME CARE INSTRUCTIONS Monitor your abdominal pain for any changes. The following actions may help to alleviate any discomfort you are experiencing:  Only take over-the-counter or prescription medicines as directed by your health care provider.  Do not take laxatives unless directed to do so by your health care provider.  Try a clear liquid diet (broth, tea, or water) as directed by your health care provider. Slowly move to a bland diet as tolerated. SEEK MEDICAL CARE IF:  You have unexplained abdominal pain.  You have abdominal pain associated with nausea or diarrhea.  You have pain when you urinate or have a bowel movement.  You experience abdominal pain that wakes you  in the night.  You have abdominal pain that is worsened or improved by eating food.  You have abdominal pain that is worsened with eating fatty foods.  You have a fever. SEEK IMMEDIATE MEDICAL CARE IF:  Your pain does not go away within 2 hours.  You keep throwing up (vomiting).  Your pain is felt only in portions of the abdomen, such as the right side or the left lower portion of the abdomen.  You pass bloody or black tarry stools. MAKE SURE YOU:  Understand these instructions.  Will watch your condition.  Will get help right away if you are not doing well or get worse.   This information is not intended to replace advice given to you by your  health care provider. Make sure you discuss any questions you have with your health care provider.   Document Released: 09/20/2005 Document Revised: 09/01/2015 Document Reviewed: 08/20/2013 Elsevier Interactive Patient Education [employer]2016 Elsevier Inc.       I personally performed the services described in this documentation, which was scribed in my presence. The recorded information has been reviewed and considered, and addended by me as needed.   Signed,   Meredith StaggersJeffrey Zya Finkle, MD [facility].  06/06/2016 3:36 PM

## 2016-06-28 DIAGNOSIS — H33321 Round hole, right eye: Secondary | ICD-10-CM | POA: Diagnosis not present

## 2016-06-28 DIAGNOSIS — H43391 Other vitreous opacities, right eye: Secondary | ICD-10-CM | POA: Diagnosis not present

## 2016-06-28 DIAGNOSIS — H35043 Retinal micro-aneurysms, unspecified, bilateral: Secondary | ICD-10-CM | POA: Diagnosis not present

## 2016-06-28 DIAGNOSIS — H3562 Retinal hemorrhage, left eye: Secondary | ICD-10-CM | POA: Diagnosis not present

## 2016-08-01 ENCOUNTER — Encounter (HOSPITAL_COMMUNITY): Payer: Self-pay | Admitting: Emergency Medicine

## 2016-08-01 ENCOUNTER — Emergency Department (HOSPITAL_COMMUNITY): Payer: Medicare Other

## 2016-08-01 ENCOUNTER — Emergency Department (HOSPITAL_COMMUNITY)
Admission: EM | Admit: 2016-08-01 | Discharge: 2016-08-01 | Disposition: A | Discharge status: Home / Self Care | Payer: Medicare Other | Attending: Emergency Medicine | Admitting: Emergency Medicine

## 2016-08-01 DIAGNOSIS — Z7984 Long term (current) use of oral hypoglycemic drugs: Secondary | ICD-10-CM | POA: Diagnosis not present

## 2016-08-01 DIAGNOSIS — Z7982 Long term (current) use of aspirin: Secondary | ICD-10-CM | POA: Diagnosis not present

## 2016-08-01 DIAGNOSIS — R109 Unspecified abdominal pain: Secondary | ICD-10-CM | POA: Diagnosis present

## 2016-08-01 DIAGNOSIS — I252 Old myocardial infarction: Secondary | ICD-10-CM | POA: Diagnosis not present

## 2016-08-01 DIAGNOSIS — Z79899 Other long term (current) drug therapy: Secondary | ICD-10-CM | POA: Insufficient documentation

## 2016-08-01 DIAGNOSIS — M1990 Unspecified osteoarthritis, unspecified site: Secondary | ICD-10-CM | POA: Insufficient documentation

## 2016-08-01 DIAGNOSIS — N3000 Acute cystitis without hematuria: Secondary | ICD-10-CM

## 2016-08-01 DIAGNOSIS — E119 Type 2 diabetes mellitus without complications: Secondary | ICD-10-CM | POA: Diagnosis not present

## 2016-08-01 DIAGNOSIS — I10 Essential (primary) hypertension: Secondary | ICD-10-CM | POA: Diagnosis not present

## 2016-08-01 DIAGNOSIS — I2510 Atherosclerotic heart disease of native coronary artery without angina pectoris: Secondary | ICD-10-CM | POA: Diagnosis not present

## 2016-08-01 DIAGNOSIS — M2550 Pain in unspecified joint: Secondary | ICD-10-CM

## 2016-08-01 DIAGNOSIS — Z8673 Personal history of transient ischemic attack (TIA), and cerebral infarction without residual deficits: Secondary | ICD-10-CM | POA: Diagnosis not present

## 2016-08-01 LAB — COMPREHENSIVE METABOLIC PANEL
ALT: 12 U/L — ABNORMAL LOW (ref 14–54)
AST: 18 U/L (ref 15–41)
Albumin: 3.1 g/dL — ABNORMAL LOW (ref 3.5–5.0)
Alkaline Phosphatase: 64 U/L (ref 38–126)
Anion gap: 9 (ref 5–15)
BUN: 20 mg/dL (ref 6–20)
CO2: 29 mmol/L (ref 22–32)
Calcium: 9.5 mg/dL (ref 8.9–10.3)
Chloride: 100 mmol/L — ABNORMAL LOW (ref 101–111)
Creatinine, Ser: 0.99 mg/dL (ref 0.44–1.00)
GFR calc Af Amer: >60 mL/min (ref >60)
GFR calc non Af Amer: 59 mL/min — ABNORMAL LOW (ref >60)
Glucose, Bld: 110 mg/dL — ABNORMAL HIGH (ref 65–99)
Potassium: 3.6 mmol/L (ref 3.5–5.1)
Sodium: 138 mmol/L (ref 135–145)
Total Bilirubin: 0.5 mg/dL (ref 0.3–1.2)
Total Protein: 7.6 g/dL (ref 6.5–8.1)

## 2016-08-01 LAB — CBC WITH DIFFERENTIAL/PLATELET
Basophils Absolute: 0 10*3/uL (ref 0.0–0.1)
Basophils Relative: 0 %
Eosinophils Absolute: 0.1 10*3/uL (ref 0.0–0.7)
Eosinophils Relative: 2 %
HCT: 34.5 % — ABNORMAL LOW (ref 36.0–46.0)
Hemoglobin: 10.8 g/dL — ABNORMAL LOW (ref 12.0–15.0)
Lymphocytes Relative: 15 %
Lymphs Abs: 1.3 10*3/uL (ref 0.7–4.0)
MCH: 29 pg (ref 26.0–34.0)
MCHC: 31.3 g/dL (ref 30.0–36.0)
MCV: 92.5 fL (ref 78.0–100.0)
Monocytes Absolute: 0.3 10*3/uL (ref 0.1–1.0)
Monocytes Relative: 3 %
Neutro Abs: 7 10*3/uL (ref 1.7–7.7)
Neutrophils Relative %: 80 %
Platelets: 484 10*3/uL — ABNORMAL HIGH (ref 150–400)
RBC: 3.73 MIL/uL — ABNORMAL LOW (ref 3.87–5.11)
RDW: 13.5 % (ref 11.5–15.5)
WBC: 8.8 10*3/uL (ref 4.0–10.5)

## 2016-08-01 LAB — URINALYSIS, ROUTINE W REFLEX MICROSCOPIC
BILIRUBIN URINE: NEGATIVE
GLUCOSE, UA: NEGATIVE mg/dL
HGB URINE DIPSTICK: NEGATIVE
KETONES UR: 15 mg/dL — AB
Nitrite: NEGATIVE
PROTEIN: NEGATIVE mg/dL
Specific Gravity, Urine: 1.022 (ref 1.005–1.030)
pH: 6.5 (ref 5.0–8.0)

## 2016-08-01 LAB — URINE MICROSCOPIC-ADD ON

## 2016-08-01 MED ORDER — HYDROCODONE-ACETAMINOPHEN 5-325 MG PO TABS: 1.0000 | ORAL_TABLET | Freq: Four times a day (QID) | ORAL | 0 refills | Status: DC | PRN

## 2016-08-01 MED ORDER — MORPHINE SULFATE (PF) 4 MG/ML IV SOLN
4.0000 mg | Freq: Once | INTRAVENOUS | Status: AC
Start: 2016-08-01 — End: 2016-08-01
  Administered 2016-08-01: 4 mg via INTRAVENOUS
  Filled 2016-08-01: qty 1

## 2016-08-01 MED ORDER — IOPAMIDOL (ISOVUE-300) INJECTION 61%
INTRAVENOUS | Status: AC
  Administered 2016-08-01: 100 mL
  Filled 2016-08-01: qty 100

## 2016-08-01 MED ORDER — CEPHALEXIN 500 MG PO CAPS: 500.0000 mg | ORAL_CAPSULE | Freq: Four times a day (QID) | ORAL | 0 refills | Status: DC

## 2016-08-01 MED ORDER — KETOROLAC TROMETHAMINE 30 MG/ML IJ SOLN
15.0000 mg | Freq: Once | INTRAMUSCULAR | Status: AC
  Administered 2016-08-01: 15 mg via INTRAVENOUS
  Filled 2016-08-01: qty 1

## 2016-08-01 MED ORDER — SODIUM CHLORIDE 0.9 % IV BOLUS (SEPSIS)
500.0000 mL | Freq: Once | INTRAVENOUS | Status: AC
  Administered 2016-08-01: 500 mL via INTRAVENOUS

## 2016-08-01 NOTE — Discharge Instructions (Signed)
Return here as needed.  Follow-up with your primary doctor and rheumatologist for further management of your joint pain

## 2016-08-01 NOTE — ED Notes (Signed)
Pt to CT at this time.

## 2016-08-01 NOTE — ED Provider Notes (Signed)
[facility]-EMERGENCY DEPT Provider Note   CSN: [id number] Arrival date & time: 08/01/16  1035  First Provider Contact:  First MD Initiated Contact with Patient 08/01/16 1410        History   Chief Complaint Chief Complaint  Patient presents with  . Other    multiple site pain    HPI Margaret Hobbs is a 65 y.o. female.  HPI Patient presents to the emergency department with diffuse joint pain.  This been worsening over the last few months.  Patient states she was taken off of Humira, which was helping with her rheumatoid arthritis.  The patient, states she has also been having abdominal discomfort over the last 6 weeks.  This got worse over that time frame.  The patient states nothing seems to make the condition better.  She states that take oxycodone as prescribed by her doctor seemed to help temporarily with her symptoms, but when she states the medicine wore off, her pain was even worse.  Patient states that she cannot afford the Humira because of her insurance. The patient denies chest pain, shortness of breath, headache,blurred vision, neck pain, fever, cough, weakness, numbness, dizziness, anorexia, edema, , nausea, vomiting, diarrhea, rash, back pain, dysuria, hematemesis, bloody stool, near syncope, or syncope. Past Medical History:  Diagnosis Date  . Aphasia   . Arthritis   . CAD (coronary artery disease)   . Cerebrovascular disease   . CVA (cerebral vascular accident) (HCC)   . Depressed   . Diabetes mellitus   . DM2 (diabetes mellitus, type 2) (HCC)   . HLD (hyperlipidemia)   . HTN (hypertension)   . Hypercholesteremia   . Hyperparathyroidism   . MI (myocardial infarction) (HCC)   . Osteoporosis   . TIA (transient ischemic attack)   . UTI (urinary tract infection)     Patient Active Problem List   Diagnosis Date Noted  . Type 2 diabetes mellitus (HCC) 03/30/2009  . HYPERCHOLESTEROLEMIA 03/30/2009  . HYPERLIPIDEMIA 03/30/2009  . Essential hypertension 03/30/2009   . MYOCARDIAL INFARCTION 03/30/2009  . Coronary atherosclerosis 03/30/2009  . CVA 03/30/2009  . URINARY TRACT INFECTION 03/30/2009  . ARTHRITIS 03/30/2009  . HYPERPARATHYROIDISM, HX OF 03/30/2009    Past Surgical History:  Procedure Laterality Date  . ABDOMINAL HYSTERECTOMY    . hysterectomy - unknown type      OB History    Gravida Para Term Preterm AB Living             1   SAB TAB Ectopic Multiple Live Births                   Home Medications    Prior to Admission medications   Medication Sig Start Date End Date Taking? Authorizing Provider  adalimumab (HUMIRA) 40 MG/0.8ML injection Inject 40 mg into the skin every 14 (fourteen) days.      Historical Provider, MD  Ascorbic Acid (VITAMIN C) 500 MG tablet Take 500 mg by mouth daily.      Historical Provider, MD  aspirin 81 MG tablet Take 81 mg by mouth daily.      Historical Provider, MD  cholecalciferol (VITAMIN D) 1000 UNITS tablet Take 2,000 Units by mouth daily.    Historical Provider, MD  clopidogrel (PLAVIX) 75 MG tablet Take 1 tablet (75 mg total) by mouth daily. 04/07/16   Wendall Stade, MD  co-enzyme Q-10 30 MG capsule Take 30 mg by mouth daily.     Historical Provider, MD  cyanocobalamin 1000  MCG tablet Take 1,000 mcg by mouth daily.     Historical Provider, MD  folic acid (FOLVITE) 1 MG tablet Take 1 mg by mouth daily.      Historical Provider, MD  glipiZIDE-metformin (METAGLIP) 5-500 MG per tablet Take 1 tablet by mouth 2 (two) times daily before a meal.      Historical Provider, MD  hydrALAZINE (APRESOLINE) 50 MG tablet Take 1 tablet (50 mg total) by mouth 2 (two) times daily. 04/07/16   Wendall StadePeter C Nishan, MD  HYDROcodone-acetaminophen (NORCO/VICODIN) 5-325 MG tablet Take 0.5-1 tablets by mouth every 6 (six) hours as needed for moderate pain. 06/06/16   Shade FloodJeffrey R Greene, MD  losartan-hydrochlorothiazide (HYZAAR) 100-25 MG tablet Take 1 tablet by mouth daily. 04/07/16   Wendall StadePeter C Nishan, MD  metoprolol succinate  (TOPROL-XL) 100 MG 24 hr tablet Take 1 tablet (100 mg total) by mouth daily. Take with or immediately following a meal. 04/07/16   Wendall StadePeter C Nishan, MD  nitroGLYCERIN (NITROSTAT) 0.4 MG SL tablet Place 0.4 mg under the tongue every 5 (five) minutes as needed for chest pain (3 doses max).    Historical Provider, MD  nystatin cream (MYCOSTATIN) Apply 1 application topically 2 (two) times daily.    Historical Provider, MD  simvastatin (ZOCOR) 40 MG tablet Take 1 tablet (40 mg total) by mouth at bedtime. 02/11/16   Wendall StadePeter C Nishan, MD  vitamin A [id number] UNIT capsule Take 10,000 Units by mouth daily.      Historical Provider, MD  vitamin E 400 UNIT capsule Take 400 Units by mouth daily.      Historical Provider, MD  zoledronic acid (RECLAST) 5 MG/100ML SOLN injection Inject 5 mg into the vein once.    Historical Provider, MD    Family History Family History  Problem Relation Age of Onset  . Heart disease Mother     pacemaker  . Hypertension Mother   . Hyperlipidemia Mother   . Diabetes Brother   . Hypertension Son   . Diabetes    . Hypertension    . Alcohol abuse    . Coronary artery disease      Social History Social History  Substance Use Topics  . Smoking status: Never Smoker  . Smokeless tobacco: Never Used  . Alcohol use No     Allergies   Remicade [infliximab] and Sulfa antibiotics   Review of Systems Review of Systems All other systems negative except as documented in the HPI. All pertinent positives and negatives as reviewed in the HPI.  Physical Exam Updated Vital Signs BP 145/64   Pulse 69   Temp 98.2 F (36.8 C) (Oral)   Resp 18   SpO2 96%   Physical Exam  Constitutional: She is oriented to person, place, and time. She appears well-developed and well-nourished. No distress.  HENT:  Head: Normocephalic and atraumatic.  Mouth/Throat: Oropharynx is clear and moist.  Eyes: Pupils are equal, round, and reactive to light.  Neck: Normal range of motion. Neck supple.    Cardiovascular: Normal rate, regular rhythm and normal heart sounds.  Exam reveals no gallop and no friction rub.   No murmur heard. Pulmonary/Chest: Effort normal and breath sounds normal. No respiratory distress. She has no wheezes.  Abdominal: Soft. Bowel sounds are normal. She exhibits no distension and no mass. There is tenderness. There is no guarding.  Neurological: She is alert and oriented to person, place, and time. She exhibits normal muscle tone. Coordination normal.  Skin: Skin is warm  and dry. No rash noted. No erythema.  Psychiatric: She has a normal mood and affect. Her behavior is normal.  Nursing note and vitals reviewed.    ED Treatments / Results  Labs (all labs ordered are listed, but only abnormal results are displayed) Labs Reviewed  URINALYSIS, ROUTINE W REFLEX MICROSCOPIC (NOT AT [facility]) - Abnormal; Notable for the following:       Result Value   APPearance CLOUDY (*)    Ketones, ur 15 (*)    Leukocytes, UA SMALL (*)    All other components within normal limits  COMPREHENSIVE METABOLIC PANEL - Abnormal; Notable for the following:    Chloride 100 (*)    Glucose, Bld 110 (*)    Albumin 3.1 (*)    ALT 12 (*)    GFR calc non Af Amer 59 (*)    All other components within normal limits  CBC WITH DIFFERENTIAL/PLATELET - Abnormal; Notable for the following:    RBC 3.73 (*)    Hemoglobin 10.8 (*)    HCT 34.5 (*)    Platelets 484 (*)    All other components within normal limits  URINE MICROSCOPIC-ADD ON - Abnormal; Notable for the following:    Squamous Epithelial / LPF 0-5 (*)    Bacteria, UA MANY (*)    Casts HYALINE CASTS (*)    All other components within normal limits    EKG  EKG Interpretation None       Radiology No results found.  Procedures Procedures (including critical care time)  Medications Ordered in ED Medications  sodium chloride 0.9 % bolus 500 mL (0 mLs Intravenous Stopped 08/01/16 1600)  ketorolac (TORADOL) 30 MG/ML injection 15  mg (15 mg Intravenous Given 08/01/16 1506)  morphine 4 MG/ML injection 4 mg (4 mg Intravenous Given 08/01/16 1507)  iopamidol (ISOVUE-300) 61 % injection (100 mLs  Contrast Given 08/01/16 1621)     Initial Impression / Assessment and Plan / ED Course  I have reviewed the triage vital signs and the nursing notes.  Pertinent labs & imaging results that were available during my care of the patient were reviewed by me and considered in my medical decision making (see chart for details).  Clinical Course   The patient has a chronic condition which think is causing her pain.  We will do a CT scan to look at her abdominal discomfort.  Patient is advised plan and all questions are answered.  He feels disease follow-up with her rheumatoid arthritis rheumatologist to further evaluate possible changes in her regimen.  Patient agrees the plan and all questions were answered  Final Clinical Impressions(s) / ED Diagnoses   Final diagnoses:  None    New Prescriptions New Prescriptions   No medications on file     Charlestine Night, PA-C 08/01/16 1634    Azalia Bilis, MD 08/01/16 [phone]

## 2016-08-01 NOTE — ED Notes (Signed)
Pt d/c home via w/c with friend. Dc instructions and scripts reviewed thoroughly with pt.

## 2016-08-01 NOTE — ED Triage Notes (Signed)
Pt here with pain to right ankle, left knee, left leg, left arm, bilateral collarbones, and right side. Pt has hx of RA and has been seen by here PCP for this. Pt sts she was taking methotrexate and prednisone. without relief. Pt sts she was not having pain when she was on humira.

## 2016-08-03 LAB — URINE CULTURE: Culture: 9000 — AB

## 2016-08-10 ENCOUNTER — Other Ambulatory Visit: Payer: Self-pay | Admitting: *Deleted

## 2016-08-10 ENCOUNTER — Telehealth: Payer: Self-pay | Admitting: Cardiovascular Disease

## 2016-08-10 DIAGNOSIS — Z79899 Other long term (current) drug therapy: Secondary | ICD-10-CM

## 2016-08-10 MED ORDER — SIMVASTATIN 40 MG PO TABS: 40.0000 mg | ORAL_TABLET | Freq: Every day | ORAL | 3 refills | Status: DC

## 2016-08-10 NOTE — Telephone Encounter (Signed)
Talked to patient. Patient will have lab work done in the next week at [facility]. Refill sent.

## 2016-08-10 NOTE — Telephone Encounter (Signed)
[facility] sim[facility]. Patient is over due for lipid and liver panel. Patient had recent CMET while in the hospital. Patient has no transportation at this time and depends on friends. Patient will go to a [facility] lab for lipid panel in the next week. Order put in for lipid. Informed patient that our office will call her with results. Patient verbalized understanding.

## 2016-08-10 NOTE — Telephone Encounter (Signed)
F/u message ° °Pt states she is returning RN call. Please call back to discuss  °

## 2016-08-10 NOTE — Telephone Encounter (Signed)
[facility] is requesting a ninety day rx for the simvastatin, patient has refills at the local pharmacy. She was to return to the office for labs after switching to simvastatin from rosuvastatin. She did not return for the labs. Please advise. Thanks, MI

## 2016-08-10 NOTE — Addendum Note (Signed)
Addended by: Virl AxePATE INGALLS, Aubrynn Katona L on: 08/10/2016 01:45 PM   Modules accepted: Orders

## 2016-08-10 NOTE — Telephone Encounter (Signed)
Left message for patient to call back  

## 2016-08-24 LAB — LIPID PANEL
CHOL/HDL RATIO: 3.5 ratio (ref <=5.0)
CHOLESTEROL: 138 mg/dL (ref 125–200)
HDL: 40 mg/dL — ABNORMAL LOW (ref >=46)
LDL Cholesterol: 59 mg/dL (ref <130)
Triglycerides: 197 mg/dL — ABNORMAL HIGH (ref <150)
VLDL: 39 mg/dL — AB (ref <30)

## 2016-08-28 IMAGING — CT CT ABD-PELV W/ CM
2 of 5 series · 16 of 46 positions shown, 18 images · IV contrast (APPLIED)
Comparison: None.

CLINICAL DATA: Pt here with pain to right ankle, left knee, left
leg, left arm, bilateral collarbones, and right side. Pt has hx of
RA and has been seen by here PCP for this. Pt Manenschijn she was taking
methotrexate and prednisone. without relief. Pt Manenschijn she was not
having pain when she was on humira. H/o appy, hysterectomy

EXAM:
CT ABDOMEN AND PELVIS WITH CONTRAST
TECHNIQUE: Multidetector CT imaging of the abdomen and pelvis was performed
using the standard protocol following bolus administration of
intravenous contrast.
CONTRAST:  100mL XKCF81-USS IOPAMIDOL (XKCF81-USS) INJECTION 61%

[Series 2: abd/ pelvis 5.0 i30f 1 · axial · 0.81mm/px · z∈[+773,+1218]mm · 13 of 101 slices shown, 15 images]
[im 6/101  soft-tissue]
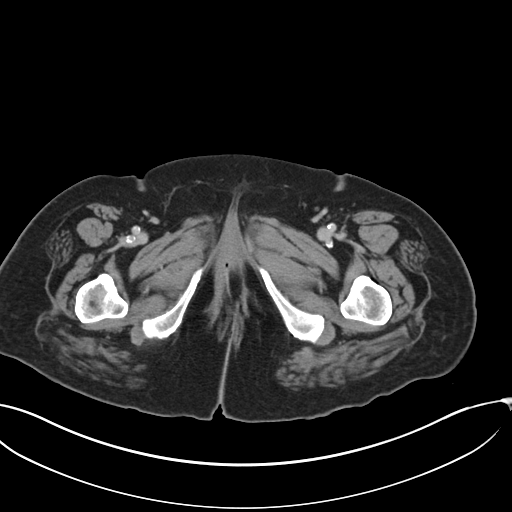
[im 6/101  bone]
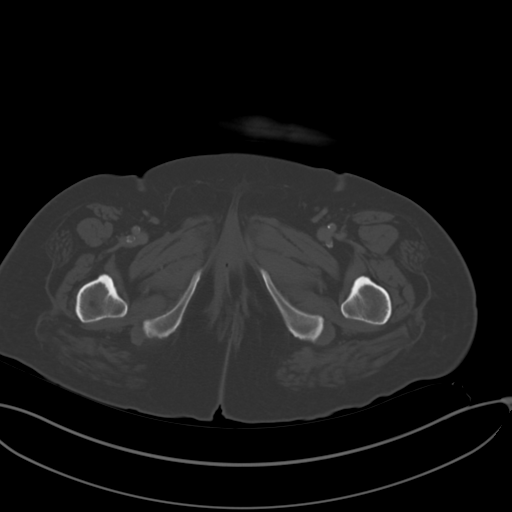
[im 16/101  soft-tissue]
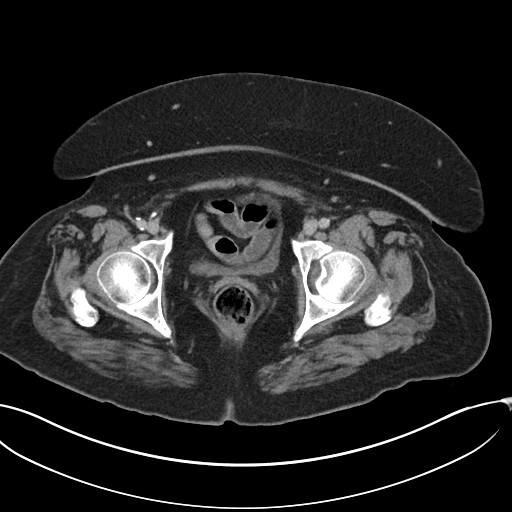
[im 22/101  soft-tissue]
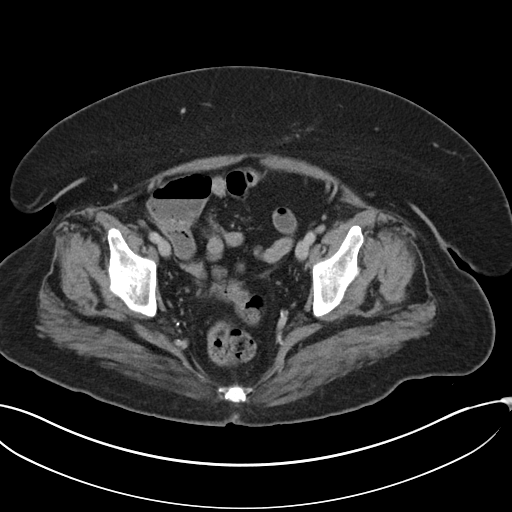
[im 27/101  soft-tissue]
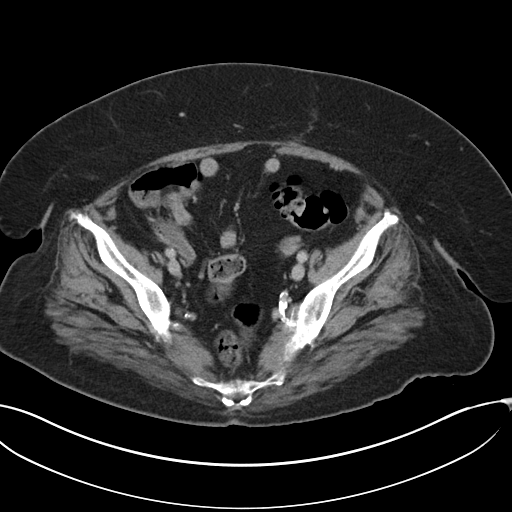
[im 37/101  soft-tissue]
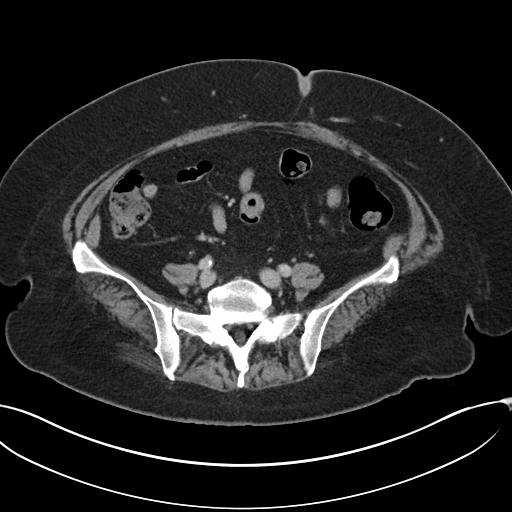
[im 43/101  soft-tissue]
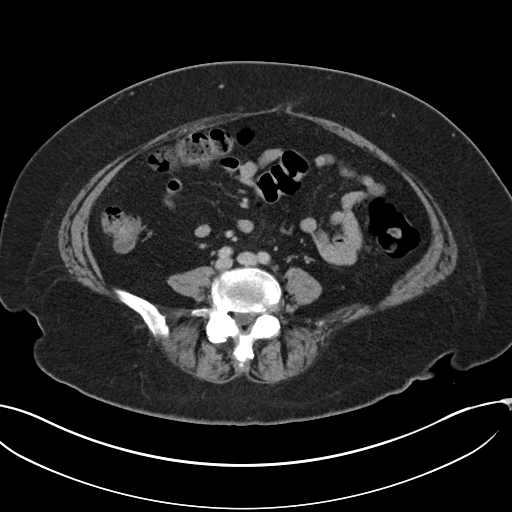
[im 53/101  soft-tissue]
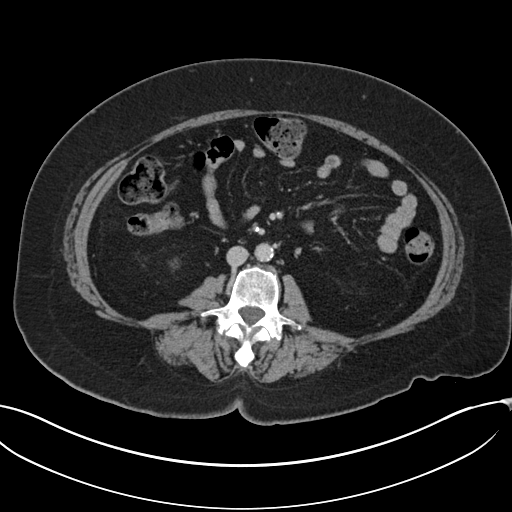
[im 58/101  soft-tissue]
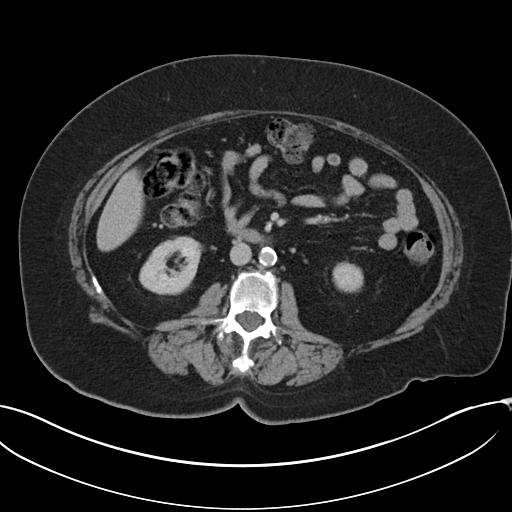
[im 64/101  soft-tissue]
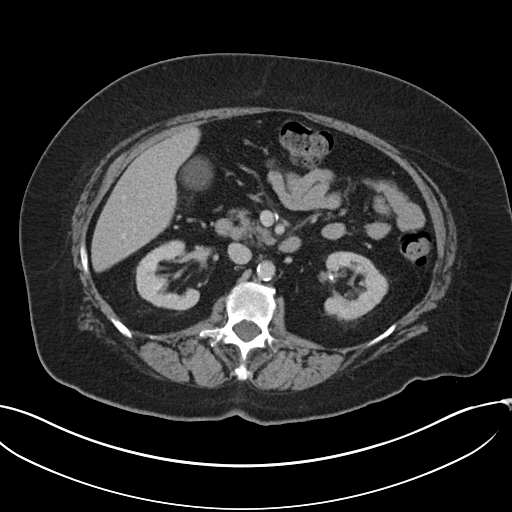
[im 64/101  bone]
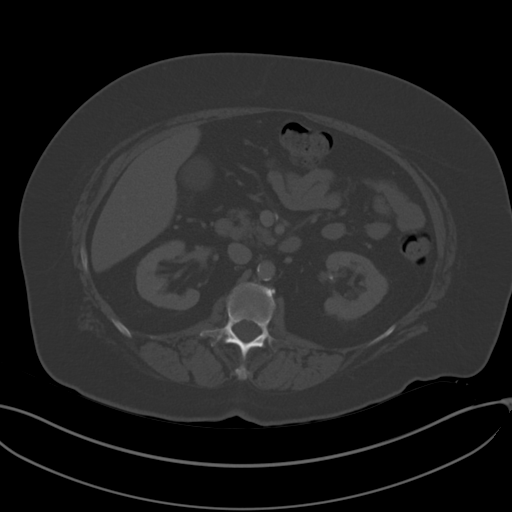
[im 74/101  soft-tissue]
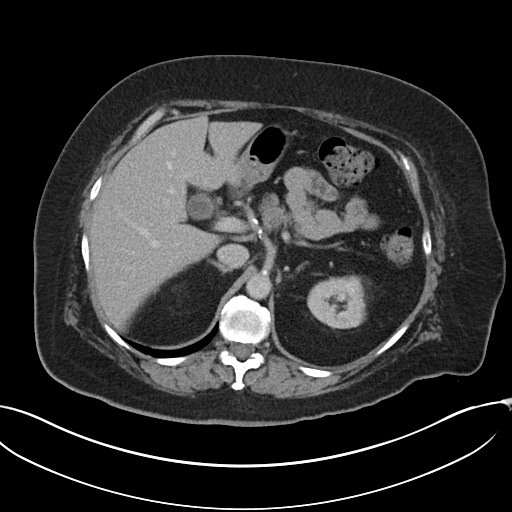
[im 79/101  soft-tissue]
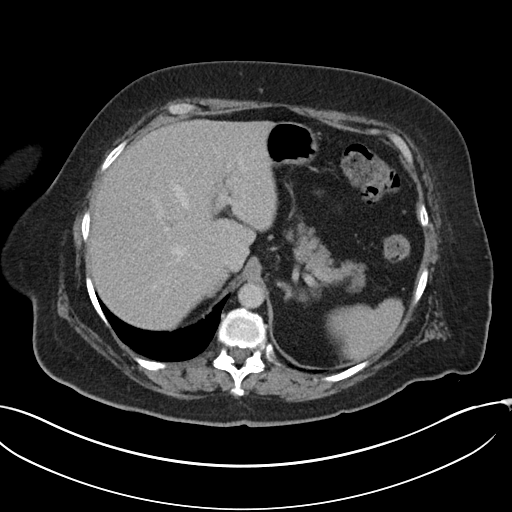
[im 85/101  soft-tissue]
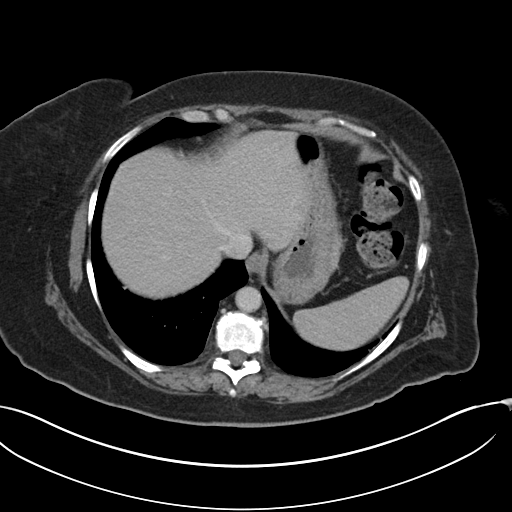
[im 95/101  soft-tissue]
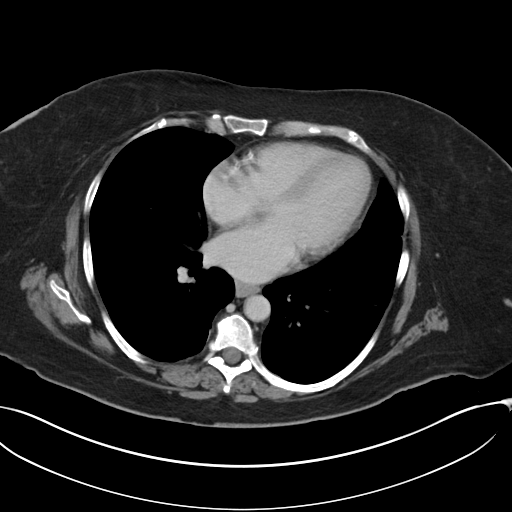

[Series 5: coronal soft tissue · coronal · 0.80mm/px · 3 of 101 slices shown]
[im 34/101  soft-tissue]
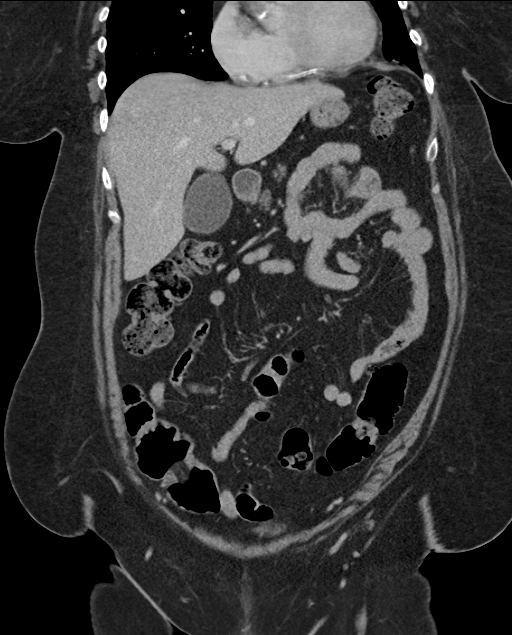
[im 45/101  soft-tissue]
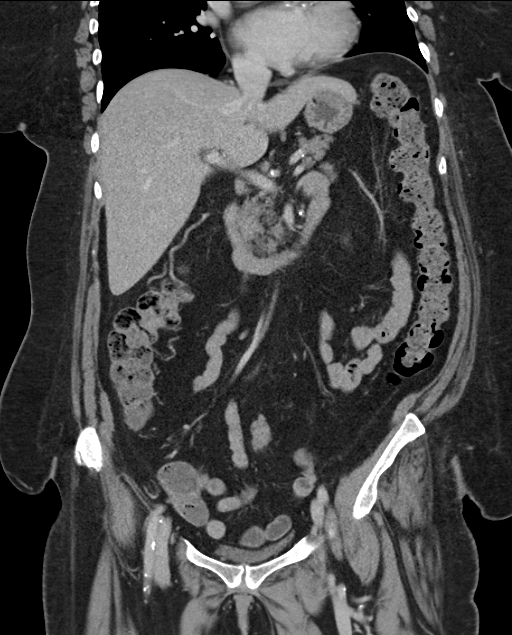
[im 56/101  soft-tissue]
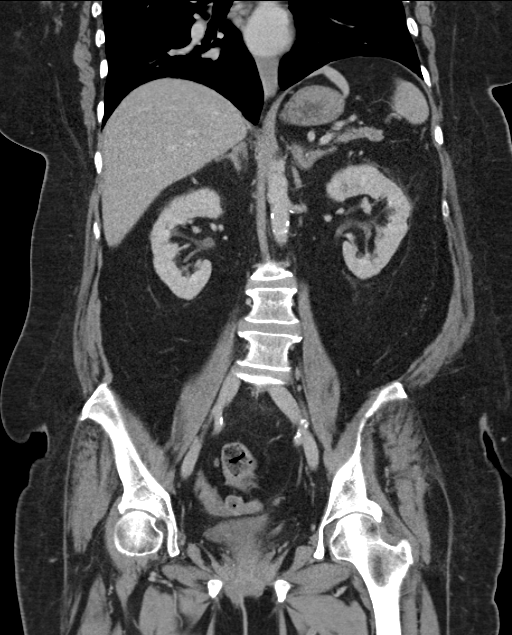

[16 of 46 positions shown; findings below may reference images not displayed]

FINDINGS: Lung bases: Heart normal in size. Minor subsegmental atelectasis
more notable on the left. Otherwise clear. No pleural effusions.

Liver, spleen, gallbladder, pancreas, adrenal glands:  Normal.

Kidneys, ureters, bladder: Small parenchymal stone in the upper pole
the right kidney. Small nonobstructing collecting system versus
vascular stone in the upper pole the left kidney. Small low-density
renal masses there are likely cysts, too small to fully
characterize. All subcentimeter. Mild bilateral renal cortical
thinning. No hydronephrosis. Normal ureters. Unremarkable bladder.

Uterus and adnexa: Uterus surgically absent. No pelvic/ adnexal
masses.

Lymph nodes:  No adenopathy.

Ascites:  None.

Vascular: Atherosclerotic calcifications noted along the abdominal
aorta and its branch vessels. No aneurysm.

Gastrointestinal: Stomach, small bowel and colon are unremarkable.
Normal appendix.

Musculoskeletal: Mild degenerative changes of the visualized spine.
No osteoblastic or osteolytic lesions.
IMPRESSION: 1. No acute findings within the abdomen or pelvis.
2. Small, subcentimeter, low-density renal lesions that are most
likely cysts but too small to fully characterize.
3. Small nonobstructing stone versus a vascular calcifications in
the upper pole the left kidney. No ureteral stones or obstructive
uropathy.
4. Atherosclerotic calcifications along the aorta its branch
vessels. No aneurysm.
5. Status post hysterectomy.

## 2016-09-07 ENCOUNTER — Encounter: Payer: Self-pay | Admitting: Cardiovascular Disease

## 2016-09-28 ENCOUNTER — Ambulatory Visit (INDEPENDENT_AMBULATORY_CARE_PROVIDER_SITE_OTHER): Payer: Medicare Other

## 2016-09-28 DIAGNOSIS — L4050 Arthropathic psoriasis, unspecified: Secondary | ICD-10-CM

## 2016-09-28 DIAGNOSIS — L408 Other psoriasis: Secondary | ICD-10-CM

## 2016-10-25 ENCOUNTER — Telehealth: Payer: Self-pay | Admitting: Pharmacist

## 2016-10-25 NOTE — Telephone Encounter (Signed)
Patient is currently on Humira 40 mg every 2 weeks, and she comes into the office to have the medication administered.  I noted that she has not been in for her injection since 09/28/16.  I called patient to discuss.  There was no answer.  I left a voicemail for patient to return my call.

## 2016-10-26 ENCOUNTER — Telehealth: Payer: Self-pay | Admitting: Radiology

## 2016-10-26 NOTE — Telephone Encounter (Signed)
Made second attempt to contact patient.  Left voicemail for patient to return my call.

## 2016-10-26 NOTE — Telephone Encounter (Signed)
Have gotten refill request [facility] for Humira

## 2016-10-27 MED ORDER — ADALIMUMAB 40 MG/0.8ML ~~LOC~~ PSKT: 1.0000 | PREFILLED_SYRINGE | SUBCUTANEOUS | 2 refills | Status: DC

## 2016-10-27 NOTE — Telephone Encounter (Signed)
Last visit 09/28/16 (injection only) Next visit 11/30/16 Labs 09/07/16 WNL TB neg 05/15/16  Ok to refill per Dr Corliss Skainseveshwar

## 2016-10-30 NOTE — Telephone Encounter (Signed)
Made third attempt to contact patient.  There was no answer, and the memory was full on patient's voicemail.  Noted that Humira refill was requested from patient's pharmacy on 10/26/16, and refill was sent.

## 2016-11-06 ENCOUNTER — Other Ambulatory Visit: Payer: Self-pay | Admitting: Cardiovascular Disease

## 2016-11-07 ENCOUNTER — Telehealth (INDEPENDENT_AMBULATORY_CARE_PROVIDER_SITE_OTHER): Payer: Self-pay

## 2016-11-07 NOTE — Telephone Encounter (Signed)
[facility] calling to clarify somethings on her Rx, please call back

## 2016-11-07 NOTE — Telephone Encounter (Signed)
Called to provide dx code

## 2016-11-27 ENCOUNTER — Other Ambulatory Visit: Payer: Self-pay | Admitting: Cardiovascular Disease

## 2016-11-28 DIAGNOSIS — L4050 Arthropathic psoriasis, unspecified: Secondary | ICD-10-CM | POA: Insufficient documentation

## 2016-11-28 DIAGNOSIS — Z79899 Other long term (current) drug therapy: Secondary | ICD-10-CM | POA: Insufficient documentation

## 2016-11-28 DIAGNOSIS — L408 Other psoriasis: Secondary | ICD-10-CM | POA: Insufficient documentation

## 2016-11-28 NOTE — Progress Notes (Addendum)
Office Visit Note  Patient: Margaret Hobbs             Date of Birth: 08-18-1951           MRN: [id number]             PCP: Martha Clan, MD Referring: Martha Clan, MD Visit Date: 11/30/2016 Occupation: @GUAROCC @    Subjective:  Hand pain   History of Present Illness: Margaret Hobbs is a 65 y.o. female with history of psoriatic arthritis. She still reports she's been having some discomfort in her bilateral hands. Her knee joints comes and goes and they're not hurting currently. Her hands swell at times. She has noticed few psoriasis  patches around her ears. She's been tolerating her medications well  Activities of Daily Living:  Patient reports morning stiffness for 10 minutes.   Patient Denies nocturnal pain.  Difficulty dressing/grooming: Denies Difficulty climbing stairs: Reports Difficulty getting out of chair: Reports Difficulty using hands for taps, buttons, cutlery, and/or writing: Denies   Review of Systems  Constitutional: Negative for fatigue, night sweats, weight gain, weight loss and weakness.  HENT: Positive for mouth dryness. Negative for mouth sores, trouble swallowing, trouble swallowing and nose dryness.   Eyes: Negative for pain, redness, visual disturbance and dryness.  Respiratory: Negative for cough, shortness of breath and difficulty breathing.   Cardiovascular: Negative for chest pain, palpitations, hypertension, irregular heartbeat and swelling in legs/feet.  Gastrointestinal: Negative for blood in stool, constipation and diarrhea.  Endocrine: Negative for increased urination.  Genitourinary: Negative for vaginal dryness.  Musculoskeletal: Positive for arthralgias, joint pain, joint swelling and morning stiffness. Negative for myalgias, muscle weakness, muscle tenderness and myalgias.  Skin: Positive for rash. Negative for color change, hair loss, skin tightness, ulcers and sensitivity to sunlight.       Psoriasis  Allergic/Immunologic:  Negative for susceptible to infections.  Neurological: Negative for dizziness, memory loss and night sweats.  Hematological: Negative for swollen glands.  Psychiatric/Behavioral: Negative for depressed mood and sleep disturbance. The patient is not nervous/anxious.     PMFS History:  Patient Active Problem List   Diagnosis Date Noted  . Primary osteoarthritis of both knees 11/29/2016  . Other psoriasis 11/28/2016  . Psoriatic arthritis (HCC) 11/28/2016  . High risk medication use 11/28/2016  . Type 2 diabetes mellitus (HCC) 03/30/2009  . HYPERCHOLESTEROLEMIA 03/30/2009  . Dyslipidemia 03/30/2009  . Essential hypertension 03/30/2009  . MYOCARDIAL INFARCTION 03/30/2009  . Coronary atherosclerosis 03/30/2009  . Cerebral artery occlusion with cerebral infarction (HCC) 03/30/2009  . URINARY TRACT INFECTION 03/30/2009  . ARTHRITIS 03/30/2009  . HYPERPARATHYROIDISM, HX OF 03/30/2009    Past Medical History:  Diagnosis Date  . Aphasia   . Arthritis   . CAD (coronary artery disease)   . Cerebrovascular disease   . CVA (cerebral vascular accident) (HCC)   . Depressed   . Diabetes mellitus   . DM2 (diabetes mellitus, type 2) (HCC)   . HLD (hyperlipidemia)   . HTN (hypertension)   . Hypercholesteremia   . Hyperparathyroidism   . MI (myocardial infarction)   . Osteoporosis   . TIA (transient ischemic attack)   . UTI (urinary tract infection)     Family History  Problem Relation Age of Onset  . Heart disease Mother     pacemaker  . Hypertension Mother   . Hyperlipidemia Mother   . Diabetes Brother   . Hypertension Son   . Diabetes    . Hypertension    .  Alcohol abuse    . Coronary artery disease     Past Surgical History:  Procedure Laterality Date  . ABDOMINAL HYSTERECTOMY    . hysterectomy - unknown type     Social History   Social History Narrative  . No narrative on file     Objective: Vital Signs: BP 126/74   Pulse 74   Resp 14   Ht 5\' 2"  (1.575 m)   Wt  180 lb (81.6 kg)   BMI 32.92 kg/m    Physical Exam   Musculoskeletal Exam: C-spine, thoracic, lumbar spine good range of motion she has no tenderness over SI joint area shoulder joints, elbow joints, wrist joints are good range of motion she has some synovial thickening over her left second MCP joint. She has some DIP PIP thickening with incomplete fist formation but no synovitis was noted. Hip joints knee joints are good range of motion she has discomfort with range of motion of her knee joints without any warmth swelling or effusion. Ankle joints are good range of motion. She has no swelling or MTPs PIPs and DIPs. Although she has some tenderness across MTP joints.  CDAI Exam: CDAI Homunculus Exam:   Tenderness:  Left hand: 2nd MCP  Joint Counts:  CDAI Tender Joint count: 1 CDAI Swollen Joint count: 0  Global Assessments:  Patient Global Assessment: 3 Provider Global Assessment: 3  CDAI Calculated Score: 7    Investigation: Findings:  TB gold negative 05/15/2016 09/07/2016 CBC CMP Vit D normal , labs 11/21/2016 CMP normal glucose 118, CBC normal, vitamin D 35.7     Imaging: No results found.  Speciality Comments: No specialty comments available.    Procedures:  No procedures performed Allergies: Remicade [infliximab] and Sulfa antibiotics   Assessment / Plan:     Visit Diagnoses: Psoriatic arthritis  -She continues to have some pain and stiffness in her hands feet and knee joints there was no synovitis on examination. she has some synovial thickening over her left second MCP joint. I will get x-rays next visit to for comparison Plan: XR Hand 2 View Right, XR Hand 2 View Left, XR Foot 2 Views Right, XR Foot 2 Views Left, XR KNEE 3 VIEW RIGHT, XR KNEE 3 VIEW LEFT  Psoriasis: She has few psoriasis patches in her scalp.  High risk medication use - Methotrexate 6/week Humira prefilled syringes: She's been tolerating medications well she labs with her PCP this month we  will try to get those and get her future labs in March and then every 3 months patient has sulfa allergy, allergy to Remicade  - Plan: CBC with Differential/Platelet, COMPLETE METABOLIC PANEL WITH GFR  Essential hypertension: Followed up by PCP.  Primary osteoarthritis of both knees: She has some some ongoing discomfort in her knee joints weight loss diet and exercise muscle strengthening was discussed.  Her other medical problems are as follows:  Dyslipidemia  Diabetes  Coronary artery disease - Status post MI  History of his stroke with right hemiparesis    Orders: Orders Placed This Encounter  Procedures  . XR Hand 2 View Right  . XR Hand 2 View Left  . XR Foot 2 Views Right  . XR Foot 2 Views Left  . XR KNEE 3 VIEW RIGHT  . XR KNEE 3 VIEW LEFT  . CBC with Differential/Platelet  . COMPLETE METABOLIC PANEL WITH GFR   No orders of the defined types were placed in this encounter.  Association of heart disease with psoriatic  arthritis was discussed. Need to monitor blood pressure, cholesterol, and to exercise 30-60 minutes on daily basis was discussed.   Face-to-face time spent with patient was 30 minutes. 50% of time was spent in counseling and coordination of care.  Follow-Up Instructions: Return in about 5 months (around 04/30/2017) for Psoriatic arthritis.   Pollyann SavoyShaili Shah Insley, MD

## 2016-11-29 DIAGNOSIS — M170 Bilateral primary osteoarthritis of knee: Secondary | ICD-10-CM | POA: Insufficient documentation

## 2016-11-30 ENCOUNTER — Encounter: Payer: Self-pay | Admitting: Rheumatology

## 2016-11-30 ENCOUNTER — Ambulatory Visit (INDEPENDENT_AMBULATORY_CARE_PROVIDER_SITE_OTHER): Payer: Medicare Other | Admitting: Rheumatology

## 2016-11-30 VITALS — BP 126/74 | HR 74 | Resp 14 | Ht 62.0 in | Wt 180.0 lb

## 2016-11-30 DIAGNOSIS — E785 Hyperlipidemia, unspecified: Secondary | ICD-10-CM | POA: Diagnosis not present

## 2016-11-30 DIAGNOSIS — L408 Other psoriasis: Secondary | ICD-10-CM | POA: Diagnosis not present

## 2016-11-30 DIAGNOSIS — I2510 Atherosclerotic heart disease of native coronary artery without angina pectoris: Secondary | ICD-10-CM

## 2016-11-30 DIAGNOSIS — E118 Type 2 diabetes mellitus with unspecified complications: Secondary | ICD-10-CM | POA: Diagnosis not present

## 2016-11-30 DIAGNOSIS — Z79899 Other long term (current) drug therapy: Secondary | ICD-10-CM

## 2016-11-30 DIAGNOSIS — L4050 Arthropathic psoriasis, unspecified: Secondary | ICD-10-CM | POA: Diagnosis not present

## 2016-11-30 DIAGNOSIS — M170 Bilateral primary osteoarthritis of knee: Secondary | ICD-10-CM | POA: Diagnosis not present

## 2016-11-30 DIAGNOSIS — I10 Essential (primary) hypertension: Secondary | ICD-10-CM

## 2016-11-30 DIAGNOSIS — I6350 Cerebral infarction due to unspecified occlusion or stenosis of unspecified cerebral artery: Secondary | ICD-10-CM | POA: Diagnosis not present

## 2016-11-30 NOTE — Progress Notes (Signed)
Rheumatology Medication Review by a Pharmacist Does the patient feel that his/her medications are working for him/her?  Yes Has the patient been experiencing any side effects to the medications prescribed?  No Does the patient have any problems obtaining medications?  Yes  Issues to address at subsequent visits: [employer] foundations for Humira   Pharmacist comments:  [name] is a pleasant 65 yo F who presents for follow up of her psoriatic arthritis.  Patient is currently taking methotrexate 15 mg weekly, folic acid daily, and Humira 40 mg every other week.  She reports she is having Dr. Alver FisherShaw's office administer her Humira injections.  She reports Dr. Alver FisherShaw's office collected her standing labs last week.  Patient called Dr. Alver FisherShaw's office and requested to have those labs faxed over to us.  I also spoke to Lintonracy at Dr. Alver FisherShaw's office who confirmed that a CBC, CMP, and Vitamin D was drawn on 11/21/16 and she confirmed they will fax the results.  Patient had TB Gold test in May 2017 which was negative.  She will be due for TB Gold again in May 2018.    Patient is currently in the catastrophic coverage phase for her medications and is able to afford Humira at this time.  She reports concern over the medication cost in 2018.  Discussed the Humira patient assistance program with patient but she reports she has applied in the past and did not qualify.  She states she does not want to try to apply again.  Discussed grant foundations.  I provided patient a list of grant foundations for patients with medicare (The Assistance Fund, Patient [employer] Foundation, Chronic Disease Fund, and the Patient [employer]).  Reviewed with patient that these are fully allocated now but advised her to check them frequently especially at the beginning of the year to see if funds become available.  I also advised patient I will monitor the funds as well and call her if funds are available.  Patient voiced  understanding.  She denied any other medication related questions at this time.    Lilla Shookachel Geovanni Rahming, Pharm.D., BCPS Clinical Pharmacist Pager: [phone] Phone: [phone] 11/30/2016 9:00 AM

## 2016-11-30 NOTE — Patient Instructions (Signed)
Standing Labs We placed an order today for your standing lab work.    Please come back and get your standing labs in March and every 3 months  We have open lab Monday through Friday from 8:30-11:30 AM and 1-4 PM at the office of Dr. Dezarai Prew/Naitik Panwala, PA.   The office is located at [address], [address], [place], [place] [address] No appointment is necessary.   Labs are drawn by [facility].  You may receive a bill from [facility] for your lab work.    

## 2016-12-01 ENCOUNTER — Other Ambulatory Visit: Payer: Self-pay | Admitting: Rheumatology

## 2016-12-01 NOTE — Telephone Encounter (Signed)
Attempted to contact patient and left message for patient to call the office.  

## 2016-12-01 NOTE — Telephone Encounter (Signed)
Patient would like to know why she did not receive 3 month supply of Humira. Please call patient.

## 2016-12-06 MED ORDER — ADALIMUMAB 40 MG/0.8ML ~~LOC~~ PSKT: 1.0000 | PREFILLED_SYRINGE | SUBCUTANEOUS | 0 refills | Status: DC

## 2016-12-06 NOTE — Addendum Note (Signed)
Addended byCaffie Damme: Salinda Snedeker W on: 12/06/2016 11:02 AM   Modules accepted: Orders

## 2016-12-06 NOTE — Telephone Encounter (Signed)
Resent for 3 month supply, was sent for one month with 2 refills in error. Will advise her when she calls back

## 2016-12-06 NOTE — Telephone Encounter (Signed)
Left message to advise patient we resent her prescription for a 3 month supply.

## 2016-12-11 ENCOUNTER — Telehealth (HOSPITAL_COMMUNITY): Payer: Self-pay | Admitting: *Deleted

## 2016-12-11 NOTE — Telephone Encounter (Signed)
Explained rescheduling carotid surveillance to q5 per current medical guidelines.  Patient instructed to call if she experiences symptoms.

## 2016-12-13 ENCOUNTER — Other Ambulatory Visit: Payer: Self-pay | Admitting: Rheumatology

## 2016-12-13 NOTE — Telephone Encounter (Signed)
Last Visit: 11/30/16 Next Visit: 04/30/17 Labs: 11/21/16 normal glucose 118  Okay to refill MTX?

## 2016-12-13 NOTE — Telephone Encounter (Signed)
ok 

## 2017-01-02 ENCOUNTER — Telehealth (INDEPENDENT_AMBULATORY_CARE_PROVIDER_SITE_OTHER): Payer: Self-pay | Admitting: Rheumatology

## 2017-01-02 NOTE — Telephone Encounter (Signed)
humira has not received authorization.  Patient is calling to confirm. Please return her call at [phone]

## 2017-01-02 NOTE — Telephone Encounter (Signed)
Humira Authorization completed on cover my meds  Request Reference Number: [id number]. HUMIRA KIT 40MG/0.8 is approved through 12/24/2017. For further questions, call [phone].  Left message to advise patient request has been approved.

## 2017-01-11 NOTE — Progress Notes (Deleted)
Patient ID: Margaret Hobbs, female   DOB: Jul 16, 1951, 66 y.o.   MRN: [id number] Walter is seen today for F/U of elevated lipids, HTN and CAD she had her last cath in 05/2007 and had a patent stent to the RCA which was placed in January of 2008. She has not had any SSCP. Her BP has been under good controll. She sees Dr. Brigitte Pulse for blood work in a couple of weeks. She is on humera for psoriasis and would be considered immunocompromized. She already got a flue shot. She needs a refill on her meds through [facility] . She is a bit depressed again this am. She has gained some weight. She has had a previous CVA with normal carotids and has a hard time with some LLE weakness    ROS: Denies fever, malais, weight loss, blurry vision, decreased visual acuity, cough, sputum, SOB, hemoptysis, pleuritic pain, palpitaitons, heartburn, abdominal pain, melena, lower extremity edema, claudication, or rash.  All other systems reviewed and negative  General: Affect appropriate Healthy:  appears stated age 66: normal Neck supple with no adenopathy JVP normal no bruits no thyromegaly Lungs clear with no wheezing and good diaphragmatic motion Heart:  S1/S2 no murmur, no rub, gallop or click PMI normal Abdomen: benighn, BS positve, no tenderness, no AAA no bruit.  No HSM or HJR Distal pulses intact with no bruits No edema Neuro non-focal Skin warm and dry No muscular weakness   Current Outpatient Prescriptions  Medication Sig Dispense Refill  . Adalimumab (HUMIRA) 40 MG/0.8ML PSKT Inject 0.8 mLs (40 mg total) into the skin every 14 (fourteen) days. Prefilled syringe/ one kit (2 syringes) 3 each 0  . Ascorbic Acid (VITAMIN C) 500 MG tablet Take 500 mg by mouth daily.      [name] aspirin 81 MG tablet Take 81 mg by mouth daily.      . cholecalciferol (VITAMIN D) 1000 UNITS tablet Take 2,000 Units by mouth daily.    . clopidogrel (PLAVIX) 75 MG tablet Take 1 tablet (75 mg total) by mouth daily. 90 tablet 0  .  co-enzyme Q-10 30 MG capsule Take 30 mg by mouth daily.     . cyanocobalamin 1000 MCG tablet Take 1,000 mcg by mouth daily.     . folic acid (FOLVITE) 1 MG tablet Take 1 mg by mouth daily.      [name] glipiZIDE-metformin (METAGLIP) 5-500 MG per tablet Take 1 tablet by mouth 2 (two) times daily before a meal.      . hydrALAZINE (APRESOLINE) 50 MG tablet Take 1 tablet (50 mg total) by mouth 2 (two) times daily. 180 tablet 0  . HYDROcodone-acetaminophen (NORCO/VICODIN) 5-325 MG tablet Take 0.5-1 tablets by mouth every 6 (six) hours as needed for moderate pain. (Patient not taking: Reported on 11/30/2016) 15 tablet 0  . losartan-hydrochlorothiazide (HYZAAR) 100-25 MG tablet Take 1 tablet by mouth daily. 90 tablet 0  . methotrexate (RHEUMATREX) 2.5 MG tablet TAKE 6 TABLETS ONCE PER  WEEK 72 tablet 0  . metoprolol succinate (TOPROL-XL) 100 MG 24 hr tablet TAKE 1 TABLET BY MOUTH  DAILY WITH OR IMMEDIATELY  FOLLOWING A MEAL 90 tablet 0  . nitroGLYCERIN (NITROSTAT) 0.4 MG SL tablet Place 0.4 mg under the tongue every 5 (five) minutes as needed for chest pain (3 doses max).    . nystatin cream (MYCOSTATIN) Apply 1 application topically 2 (two) times daily.    . simvastatin (ZOCOR) 40 MG tablet Take 1 tablet (40 mg total) by mouth at bedtime.  90 tablet 3  . vitamin A 10000 UNIT capsule Take 10,000 Units by mouth daily.      . vitamin E 400 UNIT capsule Take 400 Units by mouth daily.      . zoledronic acid (RECLAST) 5 MG/100ML SOLN injection Inject 5 mg into the vein once.     No current facility-administered medications for this visit.     Allergies  Remicade [infliximab] and Sulfa antibiotics  Electrocardiogram:  SR rate 10 possible old anterior MI no change from 2014  11/26/15  SR rate 34 PVC old IMI    Assessment and Plan HTN:  Well controlled.  Continue current medications and low sodium Dash type diet.   CAD: Stent to RCA in 2008 Chol: Crestor too expensive , lipitor caused diarrhea on zocor Needs  f/u labs  KF:[id number] low carb diet.  Target hemoglobin A1c is 6.5 or less.  Continue current medications. CVA: Duplex less than 40% bilateral ICA stenosis 01/20/16   Continue ASA/Plavix fu duplex 12/2017 Depression: stable f/u Dr Robie Ridge

## 2017-01-12 ENCOUNTER — Telehealth: Payer: Self-pay | Admitting: Radiology

## 2017-01-12 ENCOUNTER — Ambulatory Visit: Payer: Medicare Other | Admitting: Cardiovascular Disease

## 2017-01-12 MED ORDER — ADALIMUMAB 40 MG/0.8ML ~~LOC~~ PSKT: 1.0000 | PREFILLED_SYRINGE | SUBCUTANEOUS | 0 refills | Status: DC

## 2017-01-12 NOTE — Telephone Encounter (Signed)
11/21/2016 labs Last visit 11/30/16 Next visit 04/30/17 TB gold neg 05/13/16 Ok to refill per Dr Corliss Skainseveshwar

## 2017-01-12 NOTE — Telephone Encounter (Signed)
Refill request received via fax for Humira from [facility]  

## 2017-01-17 ENCOUNTER — Telehealth: Payer: Self-pay | Admitting: *Deleted

## 2017-01-17 NOTE — Telephone Encounter (Signed)
Received labs from PCP, reviewed by Dr. Corliss Skainseveshwar and patient advised lab results are normal. Patient verbalized understanding.

## 2017-01-23 ENCOUNTER — Ambulatory Visit: Payer: Medicare Other | Admitting: Family

## 2017-01-23 ENCOUNTER — Inpatient Hospital Stay (HOSPITAL_COMMUNITY)
Admission: RE | Admit: 2017-01-23 | Discharge: 2017-01-23 | Disposition: A | Discharge status: Home / Self Care | Payer: Medicare Other | Source: Ambulatory Visit | Attending: Family | Admitting: Family

## 2017-01-23 ENCOUNTER — Encounter (HOSPITAL_COMMUNITY): Payer: Medicare Other

## 2017-01-23 DIAGNOSIS — I6523 Occlusion and stenosis of bilateral carotid arteries: Secondary | ICD-10-CM

## 2017-01-23 DIAGNOSIS — E669 Obesity, unspecified: Secondary | ICD-10-CM

## 2017-01-23 DIAGNOSIS — Z8673 Personal history of transient ischemic attack (TIA), and cerebral infarction without residual deficits: Secondary | ICD-10-CM

## 2017-01-24 ENCOUNTER — Encounter (INDEPENDENT_AMBULATORY_CARE_PROVIDER_SITE_OTHER): Payer: Self-pay

## 2017-01-24 ENCOUNTER — Ambulatory Visit (INDEPENDENT_AMBULATORY_CARE_PROVIDER_SITE_OTHER): Payer: Medicare Other | Admitting: Physician Assistant

## 2017-01-24 ENCOUNTER — Encounter: Payer: Self-pay | Admitting: Physician Assistant

## 2017-01-24 VITALS — BP 108/60 | HR 71 | Ht 62.0 in | Wt 192.0 lb

## 2017-01-24 DIAGNOSIS — I10 Essential (primary) hypertension: Secondary | ICD-10-CM

## 2017-01-24 DIAGNOSIS — E7800 Pure hypercholesterolemia, unspecified: Secondary | ICD-10-CM

## 2017-01-24 DIAGNOSIS — I739 Peripheral vascular disease, unspecified: Secondary | ICD-10-CM

## 2017-01-24 DIAGNOSIS — I779 Disorder of arteries and arterioles, unspecified: Secondary | ICD-10-CM | POA: Diagnosis not present

## 2017-01-24 DIAGNOSIS — I2510 Atherosclerotic heart disease of native coronary artery without angina pectoris: Secondary | ICD-10-CM

## 2017-01-24 NOTE — Patient Instructions (Addendum)
Medication Instructions:  Your physician recommends that you continue on your current medications as directed. Please refer to the Current Medication list given to you today. Labwork: NONE Testing/Procedures: NONE Follow-Up: Your physician wants you to follow-up in: 1 YEAR WITH DR. NISHAN.  You will receive a reminder letter in the mail two months in advance. If you don't receive a letter, please call our office to schedule the follow-up appointment. Any Other Special Instructions Will Be Listed Below (If Applicable). If you need a refill on your cardiac medications before your next appointment, please call your pharmacy. 

## 2017-01-24 NOTE — Progress Notes (Signed)
Cardiology Office Note:    Date:  01/24/2017   ID:  Margaret Hobbs, DOB 04/05/1951, MRN [id number]  PCP:  Shaw, William, MD  Cardiologist:  Dr. Peter Nishan   Electrophysiologist:  n/a  Referring MD: Shaw, William, MD   Chief Complaint  Patient presents with  . Follow-up    CAD    History of Present Illness:    Margaret Hobbs is a 65 y.o. female with a hx of CAD s/p inferior STEMI in 1/08 tx with BMS to the RCA, HTN, HL, carotid artery disease, prior TIA, psoriatic arthritis. Last seen by Dr. Peter Nishan in 12/16.    She returns for Cardiology follow up.  She is here alone.  She denies chest pain, shortness of breath, syncope, orthopnea, PND or significant pedal edema. She is mainly limited by her arthritis.    Prior CV studies that were reviewed today include:    Carotid US 1/17 bilat ICA 1-39  LHC 6/08 LM distal minor stenosis LAD prox 30, mid 60, distal 80; D2 ostial 70 RCA prox 60, mid 50, distal stent patent; post AV segment prox 95 EF 60  Past Medical History:  Diagnosis Date  . Aphasia   . Arthritis   . CAD (coronary artery disease)   . Cerebrovascular disease   . CVA (cerebral vascular accident) (HCC)   . Depressed   . Diabetes mellitus   . DM2 (diabetes mellitus, type 2) (HCC)   . HLD (hyperlipidemia)   . HTN (hypertension)   . Hypercholesteremia   . Hyperparathyroidism   . MI (myocardial infarction)   . Osteoporosis   . TIA (transient ischemic attack)   . UTI (urinary tract infection)     Past Surgical History:  Procedure Laterality Date  . ABDOMINAL HYSTERECTOMY    . hysterectomy - unknown type      Current Medications: Current Meds  Medication Sig  . Adalimumab (HUMIRA) 40 MG/0.8ML PSKT Inject 0.8 mLs (40 mg total) into the skin every 14 (fourteen) days. Prefilled syringe/ one kit (2 syringes)  . Ascorbic Acid (VITAMIN C) 500 MG tablet Take 500 mg by mouth daily.    . aspirin 81 MG tablet Take 81 mg by mouth daily.    .  cholecalciferol (VITAMIN D) 1000 UNITS tablet Take 2,000 Units by mouth daily.  . clopidogrel (PLAVIX) 75 MG tablet Take 1 tablet (75 mg total) by mouth daily.  . co-enzyme Q-10 30 MG capsule Take 30 mg by mouth daily.   . cyanocobalamin 1000 MCG tablet Take 1,000 mcg by mouth daily.   . folic acid (FOLVITE) 1 MG tablet Take 1 mg by mouth daily.    . glipiZIDE-metformin (METAGLIP) 5-500 MG per tablet Take 1 tablet by mouth 2 (two) times daily before a meal.    . hydrALAZINE (APRESOLINE) 50 MG tablet Take 1 tablet (50 mg total) by mouth 2 (two) times daily.  . losartan-hydrochlorothiazide (HYZAAR) 100-25 MG tablet Take 1 tablet by mouth daily.  . methotrexate (RHEUMATREX) 2.5 MG tablet TAKE 6 TABLETS ONCE PER  WEEK  . metoprolol succinate (TOPROL-XL) 100 MG 24 hr tablet TAKE 1 TABLET BY MOUTH  DAILY WITH OR IMMEDIATELY  FOLLOWING A MEAL  . nitroGLYCERIN (NITROSTAT) 0.4 MG SL tablet Place 0.4 mg under the tongue every 5 (five) minutes as needed for chest pain (3 doses max).  . nystatin cream (MYCOSTATIN) Apply 1 application topically 2 (two) times daily.  . simvastatin (ZOCOR) 40 MG tablet Take 1 tablet (40 mg total) by   mouth at bedtime.  . vitamin A 10000 UNIT capsule Take 10,000 Units by mouth daily.    . vitamin E 400 UNIT capsule Take 400 Units by mouth daily.    . zoledronic acid (RECLAST) 5 MG/100ML SOLN injection Inject 5 mg into the vein once.     Allergies:   Remicade [infliximab] and Sulfa antibiotics   Social History   Social History  . Marital status: Widowed    Spouse name: N/A  . Number of children: 1  . Years of education: N/A   Social History Main Topics  . Smoking status: Never Smoker  . Smokeless tobacco: Never Used  . Alcohol use No  . Drug use: No  . Sexual activity: Not Asked   Other Topics Concern  . None   Social History Narrative  . None     Family History:  The patient's family history includes Diabetes in her brother; Heart disease in her mother;  Hyperlipidemia in her mother; Hypertension in her mother and son.   ROS:   Please see the history of present illness.    Review of Systems  Musculoskeletal: Positive for joint swelling.   All other systems reviewed and are negative.   EKGs/Labs/Other Test Reviewed:    EKG:  EKG is  ordered today.  The ekg ordered today demonstrates NSR, HR 71, normal axis, PRWP, QTc 452 ms, no changes from last tracing.  Recent Labs: 08/01/2016: ALT 12; BUN 20; Creatinine, Ser 0.99; Hemoglobin 10.8; Platelets 484; Potassium 3.6; Sodium 138   Recent Lipid Panel    Component Value Date/Time   CHOL 138 08/24/2016 0912   TRIG 197 (H) 08/24/2016 0912   HDL 40 (L) 08/24/2016 0912   CHOLHDL 3.5 08/24/2016 0912   VLDL 39 (H) 08/24/2016 0912   LDLCALC 59 08/24/2016 0912     Physical Exam:    VS:  BP 108/60   Pulse 71   Ht 5' 2" (1.575 m)   Wt 192 lb (87.1 kg)   BMI 35.12 kg/m     Wt Readings from Last 3 Encounters:  01/24/17 192 lb (87.1 kg)  11/30/16 180 lb (81.6 kg)  03/10/16 207 lb (93.9 kg)     Physical Exam  Constitutional: She is oriented to person, place, and time. She appears well-developed and well-nourished. No distress.  HENT:  Head: Normocephalic and atraumatic.  Eyes: No scleral icterus.  Neck: No JVD present.  Cardiovascular: Normal rate, regular rhythm and normal heart sounds.   No murmur heard. Pulmonary/Chest: Effort normal. She has no wheezes. She has no rales.  Abdominal: There is no tenderness.  Musculoskeletal: She exhibits no edema.  Neurological: She is alert and oriented to person, place, and time.  Skin: Skin is warm and dry.  Psychiatric: She has a normal mood and affect.    ASSESSMENT:    1. Coronary artery disease involving native coronary artery of native heart without angina pectoris   2. Essential hypertension   3. HYPERCHOLESTEROLEMIA   4. Bilateral carotid artery disease (HCC)    PLAN:    In order of problems listed above:  1. CAD - s/p  remote MI in 2008 with BMS to the RCA.  She had residual mod non-obstructive CAD at that time treated medically.  She has not had any further angina.  A routine follow up ETT would be acceptable to screen for progressive disease.  But, she is unable to walk on a treadmill due to her arthritis.  Therefore, will not pursue   any testing.  Continue ASA, Plavix, statin, beta-blocker.   2. HTN - BP is controlled.  3. HL - Managed by PCP.  LDL in 9/17 was 63.  Continue Simvastatin.  4. Carotid artery disease - Followed by [facility].    Medication Adjustments/Labs and Tests Ordered: Current medicines are reviewed at length with the patient today.  Concerns regarding medicines are outlined above.  Medication changes, Labs and Tests ordered today are outlined in the Patient Instructions noted below. Patient Instructions  Medication Instructions:  Your physician recommends that you continue on your current medications as directed. Please refer to the Current Medication list given to you today.  Labwork: NONE  Testing/Procedures: NONE  Follow-Up: Your physician wants you to follow-up in: 1 YEAR WITH DR. NISHAN You will receive a reminder letter in the mail two months in advance. If you don't receive a letter, please call our office to schedule the follow-up appointment.  Any Other Special Instructions Will Be Listed Below (If Applicable).  If you need a refill on your cardiac medications before your next appointment, please call your pharmacy.  Signed,  , PA-C  01/24/2017 5:35 PM    [facility] [address], [place], [place]  [address] Phone: [phone]; Fax: [phone]    

## 2017-01-31 ENCOUNTER — Other Ambulatory Visit: Payer: Self-pay | Admitting: Cardiovascular Disease

## 2017-03-21 ENCOUNTER — Other Ambulatory Visit (HOSPITAL_COMMUNITY): Payer: Self-pay | Admitting: *Deleted

## 2017-03-22 ENCOUNTER — Ambulatory Visit (HOSPITAL_COMMUNITY)
Admission: RE | Admit: 2017-03-22 | Discharge: 2017-03-22 | Disposition: A | Discharge status: Home / Self Care | Payer: Medicare Other | Source: Ambulatory Visit | Attending: Internal Medicine | Admitting: Internal Medicine

## 2017-03-22 DIAGNOSIS — M810 Age-related osteoporosis without current pathological fracture: Secondary | ICD-10-CM | POA: Insufficient documentation

## 2017-03-22 MED ORDER — ZOLEDRONIC ACID 5 MG/100ML IV SOLN
INTRAVENOUS | Status: AC
  Filled 2017-03-22: qty 100

## 2017-03-22 MED ORDER — ZOLEDRONIC ACID 5 MG/100ML IV SOLN
5.0000 mg | Freq: Once | INTRAVENOUS | Status: AC
  Administered 2017-03-22: 5 mg via INTRAVENOUS

## 2017-04-02 ENCOUNTER — Other Ambulatory Visit: Payer: Self-pay | Admitting: Cardiovascular Disease

## 2017-04-13 ENCOUNTER — Telehealth: Payer: Self-pay | Admitting: Radiology

## 2017-04-13 NOTE — Telephone Encounter (Signed)
CBC CMP were due beginning of April  Tb gold due in May Last visit 11/30/16 Next visit 04/30/17 I have called patient to see what her plan is for labs Left message for her to call us back.

## 2017-04-13 NOTE — Telephone Encounter (Signed)
Refill request received via fax for Humira from [facility]  

## 2017-04-18 ENCOUNTER — Other Ambulatory Visit: Payer: Self-pay | Admitting: Cardiovascular Disease

## 2017-04-18 MED ORDER — LOSARTAN POTASSIUM-HCTZ 100-25 MG PO TABS: 1.0000 | ORAL_TABLET | Freq: Every day | ORAL | 2 refills | Status: DC

## 2017-04-18 MED ORDER — CLOPIDOGREL BISULFATE 75 MG PO TABS
75.0000 mg | ORAL_TABLET | Freq: Every day | ORAL | 2 refills | Status: DC
Start: 2017-04-18 — End: 2018-01-08

## 2017-04-24 ENCOUNTER — Other Ambulatory Visit: Payer: Self-pay | Admitting: Rheumatology

## 2017-04-24 NOTE — Telephone Encounter (Signed)
CBC CMP were due beginning of April  Tb gold due in May Last visit 11/30/16 Next visit 04/30/17  She states CBC / CMP done at [facility] but she does not think they drew her TB gold She can do this when she follows up next week  Ok to refill per Dr Corliss Skains

## 2017-04-27 ENCOUNTER — Telehealth: Payer: Self-pay | Admitting: *Deleted

## 2017-04-27 NOTE — Telephone Encounter (Signed)
CBC/ CMP and Vitamin D drawn on 04/24/17 at PCP received and reviewed by Dr. Corliss Skainseveshwar.   Results WNL.

## 2017-04-30 ENCOUNTER — Ambulatory Visit: Payer: Medicare Other | Admitting: Rheumatology

## 2017-05-03 ENCOUNTER — Encounter: Payer: Self-pay | Admitting: Rheumatology

## 2017-05-03 ENCOUNTER — Ambulatory Visit (INDEPENDENT_AMBULATORY_CARE_PROVIDER_SITE_OTHER): Payer: Medicare Other | Admitting: Rheumatology

## 2017-05-03 VITALS — BP 122/60 | HR 78 | Resp 14 | Wt 200.0 lb

## 2017-05-03 DIAGNOSIS — L408 Other psoriasis: Secondary | ICD-10-CM

## 2017-05-03 DIAGNOSIS — Z111 Encounter for screening for respiratory tuberculosis: Secondary | ICD-10-CM | POA: Diagnosis not present

## 2017-05-03 DIAGNOSIS — D849 Immunodeficiency, unspecified: Secondary | ICD-10-CM

## 2017-05-03 DIAGNOSIS — L4050 Arthropathic psoriasis, unspecified: Secondary | ICD-10-CM | POA: Diagnosis not present

## 2017-05-03 DIAGNOSIS — M7062 Trochanteric bursitis, left hip: Secondary | ICD-10-CM

## 2017-05-03 DIAGNOSIS — D899 Disorder involving the immune mechanism, unspecified: Secondary | ICD-10-CM

## 2017-05-03 DIAGNOSIS — Z79899 Other long term (current) drug therapy: Secondary | ICD-10-CM

## 2017-05-03 NOTE — Patient Instructions (Signed)
Iliotibial Band Syndrome Rehab  Ask your health care provider which exercises are safe for you. Do exercises exactly as told by your health care provider and adjust them as directed. It is normal to feel mild stretching, pulling, tightness, or discomfort as you do these exercises, but you should stop right away if you feel sudden pain or your pain gets worse. Do not begin these exercises until told by your health care provider.  Stretching and range of motion exercises  These exercises warm up your muscles and joints and improve the movement and flexibility of your hip and pelvis.  Exercise A: Quadriceps, prone     1. Lie on your abdomen on a firm surface, such as a bed or padded floor.  2. Bend your left / right knee and hold your ankle. If you cannot reach your ankle or pant leg, loop a belt around your foot and grab the belt instead.  3. Gently pull your heel toward your buttocks. Your knee should not slide out to the side. You should feel a stretch in the front of your thigh and knee.  4. Hold this position for __________ seconds.  Repeat __________ times. Complete this stretch __________ times a day.  Exercise B: Iliotibial band     1. Lie on your side with your left / right leg in the top position.  2. Bend both of your knees and grab your left / right ankle. Stretch out your bottom arm to help you balance.  3. Slowly bring your top knee back so your thigh goes behind your trunk.  4. Slowly lower your top leg toward the floor until you feel a gentle stretch on the outside of your left / right hip and thigh. If you do not feel a stretch and your knee will not fall farther, place the heel of your other foot on top of your knee and pull your knee down toward the floor with your foot.  5. Hold this position for __________ seconds.  Repeat __________ times. Complete this stretch __________ times a day.  Strengthening exercises  These exercises build strength and endurance in your hip and pelvis. Endurance is the  ability to use your muscles for a long time, even after they get tired.  Exercise C: Straight leg raises (   hip abductors)  1. Lie on your side with your left / right leg in the top position. Lie so your head, shoulder, knee, and hip line up. You may bend your bottom knee to help you balance.  2. Roll your hips slightly forward so your hips are stacked directly over each other and your left / right knee is facing forward.  3. Tense the muscles in your outer thigh and lift your top leg 4-6 inches (10-15 cm).  4. Hold this position for __________ seconds.  5. Slowly return to the starting position. Let your muscles relax completely before doing another repetition.  Repeat __________ times. Complete this exercise __________ times a day.  Exercise D: Straight leg raises (  hip extensors)  1. Lie on your abdomen on your bed or a firm surface. You can put a pillow under your hips if that is more comfortable.  2. Bend your left / right knee so your foot is straight up in the air.  3. Squeeze your buttock muscles and lift your left / right thigh off the bed. Do not let your back arch.  4. Tense this muscle as hard as you can without increasing any   knee pain.  5. Hold this position for __________ seconds.  6. Slowly lower your leg to the starting position and allow it to relax completely.  Repeat __________ times. Complete this exercise __________ times a day.  Exercise E: Hip hike  1. Stand sideways on a bottom step. Stand on your left / right leg with your other foot unsupported next to the step. You can hold onto the railing or wall if needed for balance.  2. Keep your knees straight and your torso square. Then, lift your left / right hip up toward the ceiling.  3. Slowly let your left / right hip lower toward the floor, past the starting position. Your foot should get closer to the floor. Do not lean or bend your knees.  Repeat __________ times. Complete this exercise __________ times a day.  This information is not  intended to replace advice given to you by your health care provider. Make sure you discuss any questions you have with your health care provider.  Document Released: 12/11/2005 Document Revised: 08/15/2016 Document Reviewed: 11/12/2015  Elsevier Interactive Patient Education © 2017 [employer]vier Inc[employer]

## 2017-05-03 NOTE — Progress Notes (Signed)
Rheumatology Medication Review by a Pharmacist Does the patient feel that his/her medications are working for him/her?  Yes Has the patient been experiencing any side effects to the medications prescribed?  No Does the patient have any problems obtaining medications?  No  Issues to address at subsequent visits: None   Pharmacist comments:  Margaret Hobbs is a pleasant 66 yo F who presents for follow up of psoriatic arthritis.  She is currently taking Humira 40 mg every other week, methotrexate 6 tablets weekly and folic acid 1 mg daily.  Reviewed patient's chart and most recent standing labs were on 04/24/17.  Most recent TB Gold negative 05/11/16.  She is due for TB Gold again today.  Patient denies any questions or concerns regarding her medications at this time.    I had discussed grant foundations with patient on 11/30/17.  Patient reports her copay was very expensive at the beginning of the year but it is better now.  We did discuss grant foundations again.  I informed her I have not seen any grants for psoriatic arthritis become available this year.  I signed her up to get a notification if the [employer] foundation gets funding.  I advised her to apply ASAP if she receives an email stating funding is available.  Patient voiced understanding.    Margaret Hobbs, Pharm.D., BCPS, CPP Clinical Pharmacist Pager: [phone] Phone: [phone] 05/03/2017 11:45 AM

## 2017-05-03 NOTE — Progress Notes (Signed)
Office Visit Note  Patient: Margaret Hobbs             Date of Birth: 1951/09/27           MRN: [id number]             PCP: Martha Clan, MD Referring: Martha Clan, MD Visit Date: 05/03/2017 Occupation: @GUAROCC @    Subjective:  Pain left hip  History of Present Illness: Debar Plate is a 66 y.o. female   Last seen 11/30/2016  Patient has a history of psoriasis and psoriatic arthritis. She's been given methotrexate 6 pills per week (sometimes patient forgets to take methotrexate; may have missed 2 doses since last visit). Folic acid 2 mg every day Humira prefilled syringes every 2 weeks. She gets labs done every 3 months. She's been tolerating her medications well her joints have been doing well since the last visit. She reports that both of her wrist her hands or ankles and her feet are doing well.  Even both of her knees are doing well.  Her only complaint is that her left femur bone and her tibia bone are sore. She is not sure where that is coming from. She has not fallen or had any injuries. Patient rates her discomfort on a scale of 0-10 as a 4.; Pain comes and goes.; She states that she has trouble walking on her left leg due to the pain at times.; No falls or injuries as a result of the left leg pain. She states that the pain started a few weeks ago.; Patient states that the pain is about the same over the last 2 weeks and hasn't gotten any better or hasn't gotten any worse. When patient has the discomfort in her legs, she usually tries to lay down to rest. She is not taking any medication to help alleviate the pain. If that time she is not able to lay down, she will try to sit down to make the pain go away. Patient states that it takes about an hour for the pain to resolve and then she is able to go back to her activities.   Activities of Daily Living:  Patient reports morning stiffness for 15-20  minutes.   Patient Denies nocturnal pain.  Difficulty  dressing/grooming: Denies Difficulty climbing stairs: Reports Difficulty getting out of chair: Reports Difficulty using hands for taps, buttons, cutlery, and/or writing: Denies   Review of Systems  Constitutional: Negative for fatigue.  HENT: Negative for mouth sores and mouth dryness.   Eyes: Negative for dryness.  Respiratory: Negative for shortness of breath.   Gastrointestinal: Negative for constipation and diarrhea.  Musculoskeletal: Negative for myalgias and myalgias.  Skin: Negative for sensitivity to sunlight.  Psychiatric/Behavioral: Negative for decreased concentration and sleep disturbance.    PMFS History:  Patient Active Problem List   Diagnosis Date Noted  . Carotid artery disease (HCC) 01/24/2017  . Primary osteoarthritis of both knees 11/29/2016  . Other psoriasis 11/28/2016  . Psoriatic arthritis (HCC) 11/28/2016  . High risk medication use 11/28/2016  . Type 2 diabetes mellitus (HCC) 03/30/2009  . HYPERCHOLESTEROLEMIA 03/30/2009  . Dyslipidemia 03/30/2009  . Essential hypertension 03/30/2009  . MYOCARDIAL INFARCTION 03/30/2009  . Coronary artery disease involving native coronary artery of native heart without angina pectoris 03/30/2009  . Cerebral artery occlusion with cerebral infarction (HCC) 03/30/2009  . URINARY TRACT INFECTION 03/30/2009  . ARTHRITIS 03/30/2009  . HYPERPARATHYROIDISM, HX OF 03/30/2009    Past Medical History:  Diagnosis Date  .  Aphasia   . Arthritis   . CAD (coronary artery disease)   . Cerebrovascular disease   . CVA (cerebral vascular accident) (HCC)   . Depressed   . Diabetes mellitus   . DM2 (diabetes mellitus, type 2) (HCC)   . HLD (hyperlipidemia)   . HTN (hypertension)   . Hypercholesteremia   . Hyperparathyroidism   . MI (myocardial infarction) (HCC)   . Osteoporosis   . TIA (transient ischemic attack)   . UTI (urinary tract infection)     Family History  Problem Relation Age of Onset  . Heart disease Mother         pacemaker  . Hypertension Mother   . Hyperlipidemia Mother   . Diabetes Brother   . Hypertension Son   . Diabetes Unknown   . Hypertension Unknown   . Alcohol abuse Unknown   . Coronary artery disease Unknown    Past Surgical History:  Procedure Laterality Date  . ABDOMINAL HYSTERECTOMY    . hysterectomy - unknown type     Social History   Social History Narrative  . No narrative on file     Objective: Vital Signs: BP 122/60   Pulse 78   Resp 14   Wt 200 lb (90.7 kg)   BMI 36.58 kg/m    Physical Exam  Constitutional: She is oriented to person, place, and time. She appears well-developed and well-nourished.  HENT:  Head: Normocephalic and atraumatic.  Eyes: EOM are normal. Pupils are equal, round, and reactive to light.  Cardiovascular: Normal rate, regular rhythm and normal heart sounds.  Exam reveals no gallop and no friction rub.   No murmur heard. Pulmonary/Chest: Effort normal and breath sounds normal. She has no wheezes. She has no rales.  Abdominal: Soft. Bowel sounds are normal. She exhibits no distension. There is no tenderness. There is no guarding. No hernia.  Musculoskeletal: Normal range of motion. She exhibits no edema, tenderness or deformity.  Lymphadenopathy:    She has no cervical adenopathy.  Neurological: She is alert and oriented to person, place, and time. Coordination normal.  Skin: Skin is warm and dry. Capillary refill takes less than 2 seconds. No rash noted.  Psychiatric: She has a normal mood and affect. Her behavior is normal.  Nursing note and vitals reviewed.    Musculoskeletal Exam:  Full range of motion of all joints but patient has right-sided stroke and she does struggle extending and flexing her right leg at times and she does have trouble putting on her pants due to that but for the most part does well. Grip strength is equal and strong bilaterally For mildly tender points are all absent  Due to the right-sided stroke, she  has weakness in her right lower leg. She has some strength but is not equal to the left side. About one or 2+ strength on the right side; about 3+ strength on the left side and upper extremities is 3+ strength bilaterally.  CDAI Exam: CDAI Homunculus Exam:   Tenderness:  Left hand: 3rd MCP  Joint Counts:  CDAI Tender Joint count: 1 CDAI Swollen Joint count: 0  Global Assessments:  Patient Global Assessment: 1 Provider Global Assessment: 1  CDAI Calculated Score: 3         right calf diameter 33.5 cm       Left calf diameter 34.5 cm  Investigation:  Patient had labs done 04/24/2017 and they were scanned into her chart after being reviewed by Dr. Corliss Skains. There were  normal.   No additional findings.  No visits with results within 6 Month(s) from this visit.  Latest known visit with results is:  Telephone on 08/10/2016  Component Date Value Ref Range Status  . Cholesterol 08/24/2016 138  125 - 200 mg/dL Final  . Triglycerides 08/24/2016 197* <150 mg/dL Final  . HDL 10/21/2535 40* >=46 mg/dL Final  . Total CHOL/HDL Ratio 08/24/2016 3.5  <=6.4 Ratio Final  . VLDL 08/24/2016 39* <30 mg/dL Final  . LDL Cholesterol 08/24/2016 59  <130 mg/dL Final   Comment:   Total Cholesterol/HDL Ratio:CHD Risk                        Coronary Heart Disease Risk Table                                        Men       Women          1/2 Average Risk              3.4        3.3              Average Risk              5.0        4.4           2X Average Risk              9.6        7.1           3X Average Risk             23.4       11.0 Use the calculated Patient Ratio above and the CHD Risk table  to determine the patient's CHD Risk.    Labs done at Dr. Dorcas Carrow office first week of May 2018. Patient states that they probably faxed/sent the labs were office.  We do have labs from 01/03/2017 sent from [facility]. CMP with GFR is within normal limits CBC with differential  is within normal limits There were signed off by Dr. Corliss Skains as normal labs.  Imaging: No results found.  Speciality Comments: No specialty comments available.   Procedures:  No procedures performed Allergies: Remicade [infliximab] and Sulfa antibiotics   Assessment / Plan:     Visit Diagnoses: Psoriatic arthritis (HCC)  Other psoriasis  High risk medication use  Screening for tuberculosis - Plan: Quantiferon tb gold assay (blood)  Immunosuppressed status (HCC) - Plan: Quantiferon tb gold assay (blood)  Trochanteric bursitis, left hip - Plan: Ambulatory referral to Physical Therapy   Plan: #1: Psoriatic arthritis. Doing well. No joint pain, swelling, stiffness. On Humira every 2 weeks On methotrexate 6 pills per week. Folic acid 2 mg every day.  #2: Psoriasis. Well-controlled. No flare.  #3: High risk prescription Recent labs were scanned into the computer dated 04/24/2017 and were normal. On Humira every 2 weeks Methotrexate 6 pills per week Folic acid 2 mg daily Patient is due for TB Gold today.  #4: Left leg pain. Patient complained of pain to left calf and left thigh area. She states it started about 2 weeks ago. She recently saw her PCP but the pain was not there at the time of her PCPs visit. Patient had tenderness over the left trochanteric area Diameter  of each calf was taken and the left side is 34.5 versus the right side of 33.5. Note: Patient has a history of stroke that affected her right side and may have some mild atrophy to the right side explaining the difference in the size. Examination of the patient reveals significant left greater trochanter bursitis. Cortisone injection was offered by Dr. Corliss Skainseveshwar patient declined. We've offered her physical therapy and she is agreeable.  #5: Return to clinic in 4 months  #6: TB gold to be done today  Orders: Orders Placed This Encounter  Procedures  . Quantiferon tb gold assay (blood)  .  Ambulatory referral to Physical Therapy   No orders of the defined types were placed in this encounter.   Face-to-face time spent with patient was 30 minutes. 50% of time was spent in counseling and coordination of care.  Follow-Up Instructions: Return in about 4 months (around 09/03/2017) for PsA,Ps,HUMIRA,MTX,LEFT GR TR BURSITIS, DECLINES INJEC, WILL DO PT.. (physical therapy)   Tawni PummelNaitik Valory Wetherby, PA-C Patient complains of left hip pain and also pain radiating into her left lower extremity. She is increased pain at night. On my exam she had tenderness over left trochanteric area and over the IT band. She had no calf tenderness. I offered trochanteric bursa injection but she declined. I referred her to physical therapy I examined and evaluated the patient with Tawni PummelNaitik Jayvian Escoe PA. The plan of care was discussed as noted above.  Pollyann SavoyShaili Deveshwar, MD Note - This record has been created using Animal nutritionistDragon software.  Chart creation errors have been sought, but may not always  have been located. Such creation errors do not reflect on  the standard of medical care.

## 2017-05-08 LAB — QUANTIFERON TB GOLD ASSAY (BLOOD)
Interferon Gamma Release Assay: NEGATIVE
Mitogen-Nil: >10 IU/mL
QUANTIFERON NIL VALUE: 0.03 [IU]/mL
Quantiferon Tb Ag Minus Nil Value: 0 IU/mL

## 2017-05-08 NOTE — Progress Notes (Signed)
TB gold negative

## 2017-05-16 ENCOUNTER — Other Ambulatory Visit: Payer: Self-pay | Admitting: *Deleted

## 2017-05-16 MED ORDER — ADALIMUMAB 40 MG/0.8ML ~~LOC~~ PSKT: 40.0000 mg | PREFILLED_SYRINGE | SUBCUTANEOUS | 0 refills | Status: DC

## 2017-05-16 NOTE — Telephone Encounter (Signed)
Refill request received via fax  Last visit: 05/03/17 Next Visit: 09/03/17 Labs: 04/24/17 Elevated glucose TB Gold: 05/03/17 Negative  Okay to refill Humira?

## 2017-05-16 NOTE — Telephone Encounter (Signed)
ok 

## 2017-05-30 ENCOUNTER — Other Ambulatory Visit: Payer: Self-pay | Admitting: Physician Assistant

## 2017-05-30 MED ORDER — HYDRALAZINE HCL 50 MG PO TABS
50.0000 mg | ORAL_TABLET | Freq: Two times a day (BID) | ORAL | 1 refills | Status: DC
Start: 2017-05-30 — End: 2017-10-23

## 2017-06-26 ENCOUNTER — Other Ambulatory Visit: Payer: Self-pay | Admitting: Cardiovascular Disease

## 2017-06-26 ENCOUNTER — Other Ambulatory Visit: Payer: Self-pay | Admitting: Rheumatology

## 2017-06-26 NOTE — Telephone Encounter (Signed)
Last Visit: 05/03/17 Next Visit: 09/03/17  Okay to refill per Dr. Corliss Skainseveshwar

## 2017-08-02 ENCOUNTER — Other Ambulatory Visit: Payer: Self-pay | Admitting: Rheumatology

## 2017-08-02 NOTE — Telephone Encounter (Signed)
Patient returned Andrea's call about labs. Please advise.

## 2017-08-02 NOTE — Telephone Encounter (Signed)
Patient having PCP fax recent labs.

## 2017-08-02 NOTE — Telephone Encounter (Signed)
ok 

## 2017-08-02 NOTE — Telephone Encounter (Signed)
Last Visit: 05/03/17 Next Visit: 09/03/17 Labs: 04/24/17 TB Gold: 05/03/17 Neg  Okay to refill Humira?

## 2017-08-14 ENCOUNTER — Other Ambulatory Visit: Payer: Self-pay | Admitting: Rheumatology

## 2017-08-16 ENCOUNTER — Encounter: Payer: Self-pay | Admitting: Rheumatology

## 2017-08-16 ENCOUNTER — Ambulatory Visit (INDEPENDENT_AMBULATORY_CARE_PROVIDER_SITE_OTHER): Payer: Medicare Other | Admitting: Rheumatology

## 2017-08-16 VITALS — BP 140/58 | HR 66 | Resp 12 | Ht 62.0 in | Wt 199.0 lb

## 2017-08-16 DIAGNOSIS — Z79899 Other long term (current) drug therapy: Secondary | ICD-10-CM

## 2017-08-16 DIAGNOSIS — L4050 Arthropathic psoriasis, unspecified: Secondary | ICD-10-CM | POA: Diagnosis not present

## 2017-08-16 DIAGNOSIS — L408 Other psoriasis: Secondary | ICD-10-CM

## 2017-08-16 DIAGNOSIS — M25561 Pain in right knee: Secondary | ICD-10-CM | POA: Diagnosis not present

## 2017-08-16 DIAGNOSIS — M79644 Pain in right finger(s): Secondary | ICD-10-CM | POA: Diagnosis not present

## 2017-08-16 MED ORDER — FOLIC ACID 1 MG PO TABS: 2.0000 mg | ORAL_TABLET | Freq: Every morning | ORAL | 4 refills | Status: DC

## 2017-08-16 MED ORDER — DICLOFENAC SODIUM 1 % TD GEL: TRANSDERMAL | 3 refills | Status: DC

## 2017-08-16 MED ORDER — DICLOFENAC SODIUM 1 % TD GEL: 4.0000 g | Freq: Four times a day (QID) | TRANSDERMAL | 3 refills | Status: DC

## 2017-08-16 MED ORDER — METHOTREXATE 2.5 MG PO TABS: ORAL_TABLET | ORAL | 0 refills | Status: DC

## 2017-08-16 NOTE — Progress Notes (Signed)
Office Visit Note  Patient: Margaret Hobbs             Date of Birth: 11/27/1951           MRN: [id number]             PCP: Martha Clan, MD Referring: Martha Clan, MD Visit Date: 08/16/2017 Occupation: @GUAROCC @    Subjective:  No chief complaint on file.   History of Present Illness: Margaret Hobbs is a 66 y.o. female     Using humira every 2 weeks Pt has stopped taking mtx x 4 weeks (b/c she was on humira and thought she doesn't need mtx w/ humira since humira would do all the work.)   When she was taking methotrexate, she was taking 6 pills every week and using 2 mg of folic acid daily.  Now, pt is having pain to the right 1st pip joint (pain, swelling, off and on intermittently).  Patient is also complaining of mild pain to the right knee off and on. I will give her Voltaren gel (we discussed how to use a single great detail and patient understands and is agreeable.) She will get one tube for now and see that it's adequately working for her. If successful, she has access to 3 tubes at one time with 3 refills    Activities of Daily Living:  Patient reports morning stiffness for 15 minutes.   Patient Reports nocturnal pain.  Difficulty dressing/grooming: Denies Difficulty climbing stairs: Reports Difficulty getting out of chair: Reports Difficulty using hands for taps, buttons, cutlery, and/or writing: Denies   Review of Systems  Constitutional: Negative for fatigue.  HENT: Negative for mouth sores and mouth dryness.   Eyes: Negative for dryness.  Respiratory: Negative for shortness of breath.   Gastrointestinal: Negative for constipation and diarrhea.  Musculoskeletal: Negative for myalgias and myalgias.  Skin: Negative for sensitivity to sunlight.  Psychiatric/Behavioral: Negative for decreased concentration and sleep disturbance.    PMFS History:  Patient Active Problem List   Diagnosis Date Noted  . Carotid artery disease (HCC) 01/24/2017    . Primary osteoarthritis of both knees 11/29/2016  . Other psoriasis 11/28/2016  . Psoriatic arthritis (HCC) 11/28/2016  . High risk medication use 11/28/2016  . Type 2 diabetes mellitus (HCC) 03/30/2009  . HYPERCHOLESTEROLEMIA 03/30/2009  . Dyslipidemia 03/30/2009  . Essential hypertension 03/30/2009  . MYOCARDIAL INFARCTION 03/30/2009  . Coronary artery disease involving native coronary artery of native heart without angina pectoris 03/30/2009  . Cerebral artery occlusion with cerebral infarction (HCC) 03/30/2009  . URINARY TRACT INFECTION 03/30/2009  . ARTHRITIS 03/30/2009  . HYPERPARATHYROIDISM, HX OF 03/30/2009    Past Medical History:  Diagnosis Date  . Aphasia   . Arthritis   . CAD (coronary artery disease)   . Cerebrovascular disease   . CVA (cerebral vascular accident) (HCC)   . Depressed   . Diabetes mellitus   . DM2 (diabetes mellitus, type 2) (HCC)   . HLD (hyperlipidemia)   . HTN (hypertension)   . Hypercholesteremia   . Hyperparathyroidism   . MI (myocardial infarction) (HCC)   . Osteoporosis   . TIA (transient ischemic attack)   . UTI (urinary tract infection)     Family History  Problem Relation Age of Onset  . Heart disease Mother        pacemaker  . Hypertension Mother   . Hyperlipidemia Mother   . Diabetes Brother   . Hypertension Son   . Diabetes Unknown   .  Hypertension Unknown   . Alcohol abuse Unknown   . Coronary artery disease Unknown    Past Surgical History:  Procedure Laterality Date  . ABDOMINAL HYSTERECTOMY    . hysterectomy - unknown type     Social History   Social History Narrative  . No narrative on file     Objective: Vital Signs: BP (!) 140/58   Pulse 66   Resp 12   Ht 5\' 2"  (1.575 m)   Wt 199 lb (90.3 kg)   BMI 36.40 kg/m    Physical Exam  Constitutional: She is oriented to person, place, and time. She appears well-developed and well-nourished.  HENT:  Head: Normocephalic and atraumatic.  Eyes: Pupils are  equal, round, and reactive to light. EOM are normal.  Cardiovascular: Normal rate, regular rhythm and normal heart sounds.  Exam reveals no gallop and no friction rub.   No murmur heard. Pulmonary/Chest: Effort normal and breath sounds normal. She has no wheezes. She has no rales.  Abdominal: Soft. Bowel sounds are normal. She exhibits no distension. There is no tenderness. There is no guarding. No hernia.  Musculoskeletal: Normal range of motion. She exhibits no edema, tenderness or deformity.  Lymphadenopathy:    She has no cervical adenopathy.  Neurological: She is alert and oriented to person, place, and time. Coordination normal.  Skin: Skin is warm and dry. Capillary refill takes less than 2 seconds. No rash noted.  Psychiatric: She has a normal mood and affect. Her behavior is normal.  Nursing note and vitals reviewed.    Musculoskeletal Exam:  Range of motion of all joints Grip strength is equal and strong bilaterally Fiber myalgia tender points are all absent  CDAI Exam: CDAI Homunculus Exam:   Tenderness:  Right hand: 1st PIP  Swelling:  Right hand: 1st PIP  Joint Counts:  CDAI Tender Joint count: 1 CDAI Swollen Joint count: 1  Global Assessments:  Patient Global Assessment: 3 Provider Global Assessment: 3  CDAI Calculated Score: 8    Investigation: No additional findings.  Patient had labs done at her PCPs office, Dr. Clelia Croft. The labs that we have are dated May 2018 and was reviewed by Dr. Corliss Skains. Patient has had updated labs since May 2018(July 2018 was the last time she had labs done but we do not have copy of those yet. Patient will phone her physician so they can forwarded to [place])  Office Visit on 05/03/2017  Component Date Value Ref Range Status  . Interferon Gamma Release Assay 05/03/2017 NEGATIVE  NEGATIVE Final   Negative test result. M. tuberculosis complex infection unlikely.  . Quantiferon Nil Value 05/03/2017 0.03  IU/mL Final  . Mitogen-Nil  05/03/2017 >10.00  IU/mL Final  . Quantiferon Tb Ag Minus Nil Value 05/03/2017 0.00  IU/mL Final   Comment:   The Nil tube value is used to determine if the patient has a preexisting immune response which could cause a false-positive reading on the test. In order for a test to be valid, the Nil tube must have a value of less than or equal to 8.0 IU/mL.   The mitogen control tube is used to assure the patient has a healthy immune status and also serves as a control for correct blood handling and incubation. It is used to detect false-negative readings. The mitogen tube must have a gamma interferon value of greater than or equal to 0.5 IU/mL higher than the value of the Nil tube.   The TB antigen tube is coated  with the M. tuberculosis specific antigens. For a test to be considered positive, the TB antigen tube value minus the Nil tube value must be greater than or equal to 0.35 IU/mL.   For additional information, please refer to http://education.quest[facility].com/faq/QFT (This link is being provided for informational/educational purposes only.)      Imaging: No results found.  Speciality Comments: No specialty comments available.    Procedures:  No procedures performed Allergies: Remicade [infliximab] and Sulfa antibiotics   Assessment / Plan:     Visit Diagnoses: Psoriatic arthritis (HCC)  Other psoriasis  High risk medication use  Acute pain of right knee - 2 days of pain; no injury; tyl arthritisi helped until it wore off.  Pain of right thumb - after stopping mtx for 1 month; also crocheting a lot and may have aggravated thumb)   Plan: #1: Psoriatic arthritis and psoriasis. Doing well. Patient psoriatic arthritis is doing well except she stopped taking methotrexate for about a month. Patient reports that she felt that Humira would be doing all the work and she didn't need methotrexate anymore. Therefore she discontinued the methotrexate and now her right  first finger at the PIP joint is having some swelling and pain off and on. Patient rates her pain between a 2-4 on a scale of 0-10 and it is intermittent. She also reports that she has a little bit of rash on the back part of her ear on the left side. This is consistent with psoriasis. She uses over-the-counter hydrocortisone zone 10 for relief. I can give her clobetasol ointment which is slightly stronger but patient declines. I've informed her that she can call me in my office if she changes her mind and wants [place] to prescribe the clobetasol ointment.  #2: Humira every 2 weeks And methotrexate 6 pills per week (patient has not been taking for the last 1 month thinking that she only needs to take Humira as a monotherapy) Folic acid 2 mg daily  Patient had labs done at her PCPs office, Dr. Clelia Croft. The labs that we have are dated May 2018 and was reviewed by Dr. Corliss Skains. Patient has had updated labs since May 2018(July 2018 was the last time she had labs done but we do not have copy of those yet. Patient will phone her physician so they can forwarded to [place])  Patient's TB gold is up-to-date and negative as of May 2018  #3: Right knee pain. Mild pain. I offered the patient a cortisone injection if its moderate or severe and patient declines at this time Instead, she will use Voltaren gel. 3-4 g up to 3 times a day to the right knee when necessary Dispense 3 tubes with 3 refills  #4: Return to clinic in 4 months PsA,Ps, Humira every 2 wkMTX6 pills qwk, folic2 mg qd, rt 1st PIP pain,rt kj pain  Orders: No orders of the defined types were placed in this encounter.  Meds ordered this encounter  Medications  . folic acid (FOLVITE) 1 MG tablet    Sig: Take 2 tablets (2 mg total) by mouth every morning.    Dispense:  180 tablet    Refill:  4    Order Specific Question:   Supervising Provider    Answer:   Pollyann Savoy [[id number]]  . methotrexate (RHEUMATREX) 2.5 MG tablet    Sig: TAKE 6  TABLETS ONCE PER  WEEK    Dispense:  72 tablet    Refill:  0    Order Specific Question:  Supervising Provider    Answer:   Pollyann Savoy [[id number]]  . diclofenac sodium (VOLTAREN) 1 % GEL    Sig: Apply 4 g topically 4 (four) times daily. Voltaren Gel 3 grams to 3 large joints upto TID 3 TUBES with 3 refills    Dispense:  3 Tube    Refill:  3    Voltaren Gel 3 grams to 3 large joints upto TID 3 TUBES with 3 refills    Order Specific Question:   Supervising Provider    Answer:   Pollyann Savoy [phone]    Face-to-face time spent with patient was 30 minutes. 50% of time was spent in counseling and coordination of care.  Follow-Up Instructions: Return in about 4 months (around 12/16/2017) for PsA,Ps, Humira every 2 wkMTX6 pills qwk, folic2 mg qd, rt 1st PIP pain,rt kj pain.   Tawni Pummel, PA-C I examined and evaluated the patient with Tawni Pummel PA. Patient is having a mild flare off methotrexate. She has some synovitis on my exam. She will resume methotrexate at this point. We will see how she responds to the combination therapy. The plan of care was discussed as noted above.  Pollyann Savoy, MD Note - This record has been created using Animal nutritionist.  Chart creation errors have been sought, but may not always  have been located. Such creation errors do not reflect on  the standard of medical care.

## 2017-08-21 ENCOUNTER — Telehealth: Payer: Self-pay | Admitting: *Deleted

## 2017-08-21 ENCOUNTER — Telehealth: Payer: Self-pay | Admitting: Radiology

## 2017-08-21 ENCOUNTER — Other Ambulatory Visit: Payer: Self-pay | Admitting: Radiology

## 2017-08-21 MED ORDER — DICLOFENAC SODIUM 1 % TD GEL: TRANSDERMAL | 3 refills | Status: DC

## 2017-08-21 NOTE — Telephone Encounter (Signed)
Resent Rx for her V gel, sig not clear to mail order pharmacy

## 2017-08-21 NOTE — Telephone Encounter (Signed)
Labs received from PCP drawn on 07/17/17  CBC/ CMP WNL

## 2017-08-21 NOTE — Telephone Encounter (Signed)
CBC and CMP normal except for Glucose elevated 121. These are from 07/17/17. I have sent for scanning.

## 2017-09-03 ENCOUNTER — Ambulatory Visit: Payer: Medicare Other | Admitting: Rheumatology

## 2017-09-11 ENCOUNTER — Telehealth: Payer: Self-pay | Admitting: Rheumatology

## 2017-09-11 NOTE — Telephone Encounter (Signed)
Please, advise her to see ortho/ back spec.

## 2017-09-11 NOTE — Telephone Encounter (Signed)
Patient states she is having severe back pain. Patient states she was in her PCP's office as they administer her Humira for her every 2 week for her. Patient states she had to be wheeled from the car to the office and wheeled back out to the car. Patient states she took a Tylenol states she is not having relief. Patient states PCP recommended she contact us about being seen. Patient denies any injury to her back. Patient states the pain starts at her waist and then it shoots up her back. Patient states this started Thursday and has gotten worse. Patient is on Humira and MTX. Please advise.

## 2017-09-11 NOTE — Telephone Encounter (Signed)
Patient was in parking lot wanting to be seen now for back pain. Patient was just seen by GP, and was told it might be a good idea to be seen by Dr. Corliss Skains. Patient now going home since we could not work her in right now. Please call patient to advise.

## 2017-09-11 NOTE — Telephone Encounter (Signed)
Patient advised she should see orthopedic or back specialist. Patient verbalized understanding.

## 2017-09-17 DIAGNOSIS — D849 Immunodeficiency, unspecified: Secondary | ICD-10-CM | POA: Insufficient documentation

## 2017-10-21 ENCOUNTER — Other Ambulatory Visit: Payer: Self-pay | Admitting: Rheumatology

## 2017-10-22 NOTE — Telephone Encounter (Signed)
Last Visit: 08/16/17 Next Visit: 01/04/18 Labs: 09/11/17 WNL TB Gold: 05/03/17 Neg  Okay to refill per Dr. Corliss Skainseveshwar

## 2017-10-23 ENCOUNTER — Other Ambulatory Visit: Payer: Self-pay | Admitting: Physician Assistant

## 2017-12-11 ENCOUNTER — Telehealth: Payer: Self-pay

## 2017-12-11 NOTE — Telephone Encounter (Signed)
Received a fax from [facility] regarding pts refill on Humira. They have been unsuccessful attempt in reaching the patient to schedule a refill. Called patient to update. Left message.   Arita Severtson, [place], CPhT 2:02 PM

## 2017-12-23 NOTE — Progress Notes (Signed)
Office Visit Note  Patient: Margaret Hobbs             Date of Birth: [birth year]           MRN: [id number]             PCP: Martha ClanShaw, William, MD Referring: Martha ClanShaw, William, MD Visit Date: 01/04/2018 Occupation: @GUAROCC @    Subjective:  Medication monitoring    History of Present Illness: Margaret Hobbs is a 66 y.o. female with history of psoriatic arthritis and osteoarthritis.  She continues to be on Humira every 2 weeks, MTX 6 tablets weekly, and Folic acid 2 mg.  She needs a refill of MTX.  She denies any joint pain or swelling.  She states her knees are doing ok, but cause discomfort climbing stairs.  She has psoriasis behind her left ear. She has labs every 2 months through Dr. Alver FisherShaw's office.  Denies SI joint pain, plantar fasciitis, or achilles tendonitis.     Activities of Daily Living:  Patient reports morning stiffness for 5 minutes.   Patient Denies nocturnal pain.  Difficulty dressing/grooming: Denies Difficulty climbing stairs: Reports Difficulty getting out of chair: Reports Difficulty using hands for taps, buttons, cutlery, and/or writing: Denies   Review of Systems  Constitutional: Negative for fatigue and weakness.  HENT: Negative for mouth sores, mouth dryness and nose dryness.   Eyes: Negative for redness and dryness.  Respiratory: Negative for cough, hemoptysis, shortness of breath and difficulty breathing.   Cardiovascular: Negative for chest pain, palpitations, hypertension, irregular heartbeat and swelling in legs/feet.  Gastrointestinal: Positive for diarrhea. Negative for blood in stool and constipation.  Endocrine: Negative for increased urination.  Genitourinary: Negative for painful urination.  Musculoskeletal: Negative for arthralgias, joint pain, joint swelling, myalgias, muscle weakness, morning stiffness, muscle tenderness and myalgias.  Skin: Positive for rash (Psorasis ). Negative for color change, pallor, hair loss, nodules/bumps,  redness, skin tightness, ulcers and sensitivity to sunlight.  Neurological: Negative for dizziness, numbness and headaches.  Hematological: Negative for swollen glands.  Psychiatric/Behavioral: Positive for sleep disturbance. Negative for depressed mood. The patient is not nervous/anxious.     PMFS History:  Patient Active Problem List   Diagnosis Date Noted  . Carotid artery disease (HCC) 01/24/2017  . Primary osteoarthritis of both knees 11/29/2016  . Other psoriasis 11/28/2016  . Psoriatic arthritis (HCC) 11/28/2016  . High risk medication use 11/28/2016  . Type 2 diabetes mellitus (HCC) 03/30/2009  . HYPERCHOLESTEROLEMIA 03/30/2009  . Dyslipidemia 03/30/2009  . Essential hypertension 03/30/2009  . MYOCARDIAL INFARCTION 03/30/2009  . Coronary artery disease involving native coronary artery of native heart without angina pectoris 03/30/2009  . Cerebral artery occlusion with cerebral infarction (HCC) 03/30/2009  . URINARY TRACT INFECTION 03/30/2009  . ARTHRITIS 03/30/2009  . HYPERPARATHYROIDISM, HX OF 03/30/2009    Past Medical History:  Diagnosis Date  . Aphasia   . Arthritis   . CAD (coronary artery disease)   . Cerebrovascular disease   . CVA (cerebral vascular accident) (HCC)   . Depressed   . Diabetes mellitus   . DM2 (diabetes mellitus, type 2) (HCC)   . HLD (hyperlipidemia)   . HTN (hypertension)   . Hypercholesteremia   . Hyperparathyroidism   . MI (myocardial infarction) (HCC)   . Osteoporosis   . TIA (transient ischemic attack)   . UTI (urinary tract infection)     Family History  Problem Relation Age of Onset  . Heart disease Mother  pacemaker  . Hypertension Mother   . Hyperlipidemia Mother   . Diabetes Brother   . Hypertension Son   . Diabetes Unknown   . Hypertension Unknown   . Alcohol abuse Unknown   . Coronary artery disease Unknown    Past Surgical History:  Procedure Laterality Date  . ABDOMINAL HYSTERECTOMY    . hysterectomy -  unknown type     Social History   Social History Narrative  . Not on file     Objective: Vital Signs: BP 116/60 (BP Location: Left Arm, Patient Position: Sitting, Cuff Size: Normal)   Pulse 64   Resp 18   Ht 5\' 2"  (1.575 m)   Wt 196 lb (88.9 kg)   BMI 35.85 kg/m    Physical Exam  Constitutional: She is oriented to person, place, and time. She appears well-developed and well-nourished.  HENT:  Head: Normocephalic and atraumatic.  Eyes: Conjunctivae and EOM are normal.  Neck: Normal range of motion.  Cardiovascular: Normal rate, regular rhythm, normal heart sounds and intact distal pulses.  Pulmonary/Chest: Effort normal and breath sounds normal.  Abdominal: Soft. Bowel sounds are normal.  Lymphadenopathy:    She has no cervical adenopathy.  Neurological: She is alert and oriented to person, place, and time.  Skin: Skin is warm and dry. Capillary refill takes less than 2 seconds.  Psoriasis behind left ear  Psychiatric: She has a normal mood and affect. Her behavior is normal.  Nursing note and vitals reviewed.    Musculoskeletal Exam: C-spine, thoracic, and lumbar spine limited ROM.  Shoulder joints, elbow joints, wrist joints, MCPs, PIPs, and DIPs good ROM with no synovitis.  She has synovial thickening of left 2nd and 3rd MCP and DIP synovial thickening.  Hip joints, knee joints, ankle joints, MTPs, PIPs, and DIPs good ROM with synovitis.  Mild tenderness of right trochanteric bursa. No midline spinal tenderness.  No SI joint tenderness.  No plantar fasciitis or achilles tendonitis.   CDAI Exam: CDAI Homunculus Exam:   Joint Counts:  CDAI Tender Joint count: 0 CDAI Swollen Joint count: 0  Global Assessments:  Patient Global Assessment: 4   CDAI Calculated Score: 4    Investigation: No additional findings.TB Gold: 05/03/2017 Negative  CBC Latest Ref Rng & Units 08/01/2016 06/06/2016 06/04/2007  WBC 4.0 - 10.5 K/uL 8.8 7.7 6.5  Hemoglobin 12.0 - 15.0 g/dL 10.8(L)  12.5 10.3(L)  Hematocrit 36.0 - 46.0 % 34.5(L) 35.0(A) 30.8(L)  Platelets 150 - 400 K/uL 484(H) - 272   CMP Latest Ref Rng & Units 08/01/2016 06/04/2007 06/03/2007  Glucose 65 - 99 mg/dL 161(W) 960(A) 99  BUN 6 - 20 mg/dL 20 14 14   Creatinine 0.44 - 1.00 mg/dL 5.40 9.81 1.91  Sodium 135 - 145 mmol/L 138 141 142  Potassium 3.5 - 5.1 mmol/L 3.6 3.5 3.3(L)  Chloride 101 - 111 mmol/L 100(L) 110 108  CO2 22 - 32 mmol/L 29 28 29   Calcium 8.9 - 10.3 mg/dL 9.5 9.6 9.8  Total Protein 6.5 - 8.1 g/dL 7.6 - 6.1  Total Bilirubin 0.3 - 1.2 mg/dL 0.5 - 0.4  Alkaline Phos 38 - 126 U/L 64 - 30(L)  AST 15 - 41 U/L 18 - 14  ALT 14 - 54 U/L 12(L) - 11    Imaging: No results found.  Speciality Comments: No specialty comments available.    Procedures:  No procedures performed Allergies: Remicade [infliximab] and Sulfa antibiotics   Assessment / Plan:     Visit Diagnoses:  Psoriatic arthritis (HCC): No synovitis or dactylitis on exam.  She is clinically doing well on Humira every 2 weeks, MTX 6 tablets weekly, and folic acid 2 mg daily.  She was given a refill of MTX.  Her next Humira syringes will arrive to her on Tuesday.    Other psoriasis: She has a patch of psoriasis behind her left ear.    High risk medication use - Humira, MTX, folic acid.  She has labs performed through her PCP Dr. Clelia CroftShaw.  She states her most recent labs were performed on 01/01/18.    Primary osteoarthritis of both knees: No warmth, effusion, or crepitus on exam.    History of hypertension: well controlled in the office today.   Other medical conditions are listed as follows:   History of stroke - with right hemiparesis   History of coronary artery disease - Status post MI  Dyslipidemia  History of hypercholesterolemia  History of diabetes mellitus    Orders: No orders of the defined types were placed in this encounter.  Meds ordered this encounter  Medications  . methotrexate (RHEUMATREX) 2.5 MG tablet    Sig:  TAKE 6 TABLETS ONCE PER  WEEK    Dispense:  72 tablet    Refill:  0    Face-to-face time spent with patient was 30 minutes. Greater than 50% of time was spent in counseling and coordination of care.  Follow-Up Instructions: Return in about 5 months (around 06/04/2018) for Psoriatic arthritis.   Pollyann SavoyShaili Quanta Robertshaw, MD  Note - This record has been created using Animal nutritionistDragon software.  Chart creation errors have been sought, but may not always  have been located. Such creation errors do not reflect on  the standard of medical care.

## 2018-01-02 ENCOUNTER — Telehealth: Payer: Self-pay | Admitting: Rheumatology

## 2018-01-02 NOTE — Telephone Encounter (Signed)
Patient left a message stating that prior authorization is needed for her Humira.  She stated that it must be marked Urgent when you send it in.  Patient's CB# [phone]

## 2018-01-03 NOTE — Telephone Encounter (Signed)
PA submitted to pts insurance via cover my meds.  Received a confirmation stating the prior authorization for HUMIRA has been approved from 01/03/2018 to 12/24/2018.   Reference number: [id number] Phone number:[phone]  Will send document to scan center.  Called pt to update. Pt voices understanding and denies any questions at this time  Margaret DukeHopkins, Margaret Hobbs, CPhT 3:54 PM

## 2018-01-04 ENCOUNTER — Ambulatory Visit: Payer: Medicare Other | Admitting: Rheumatology

## 2018-01-04 ENCOUNTER — Encounter: Payer: Self-pay | Admitting: Rheumatology

## 2018-01-04 VITALS — BP 116/60 | HR 64 | Resp 18 | Ht 62.0 in | Wt 196.0 lb

## 2018-01-04 DIAGNOSIS — Z8639 Personal history of other endocrine, nutritional and metabolic disease: Secondary | ICD-10-CM | POA: Diagnosis not present

## 2018-01-04 DIAGNOSIS — L408 Other psoriasis: Secondary | ICD-10-CM | POA: Diagnosis not present

## 2018-01-04 DIAGNOSIS — Z79899 Other long term (current) drug therapy: Secondary | ICD-10-CM

## 2018-01-04 DIAGNOSIS — L4050 Arthropathic psoriasis, unspecified: Secondary | ICD-10-CM | POA: Diagnosis not present

## 2018-01-04 DIAGNOSIS — M170 Bilateral primary osteoarthritis of knee: Secondary | ICD-10-CM | POA: Diagnosis not present

## 2018-01-04 DIAGNOSIS — Z8673 Personal history of transient ischemic attack (TIA), and cerebral infarction without residual deficits: Secondary | ICD-10-CM

## 2018-01-04 DIAGNOSIS — Z8679 Personal history of other diseases of the circulatory system: Secondary | ICD-10-CM | POA: Diagnosis not present

## 2018-01-04 DIAGNOSIS — E785 Hyperlipidemia, unspecified: Secondary | ICD-10-CM

## 2018-01-04 MED ORDER — METHOTREXATE 2.5 MG PO TABS: ORAL_TABLET | ORAL | 0 refills | Status: DC

## 2018-01-08 ENCOUNTER — Other Ambulatory Visit: Payer: Self-pay | Admitting: Physician Assistant

## 2018-01-08 ENCOUNTER — Other Ambulatory Visit: Payer: Self-pay | Admitting: Cardiovascular Disease

## 2018-01-10 MED ORDER — HYDRALAZINE HCL 50 MG PO TABS: ORAL_TABLET | ORAL | 0 refills | Status: DC

## 2018-01-10 NOTE — Addendum Note (Signed)
Addended by: Valrie HartISLEY, Lerline Valdivia L on: 01/10/2018 09:14 AM   Modules accepted: Orders

## 2018-01-18 ENCOUNTER — Other Ambulatory Visit: Payer: Self-pay | Admitting: Cardiovascular Disease

## 2018-01-25 ENCOUNTER — Other Ambulatory Visit: Payer: Self-pay | Admitting: Rheumatology

## 2018-01-25 NOTE — Telephone Encounter (Signed)
Last Visit: 01/04/18 Next Visit: 06/20/18 Labs:

## 2018-01-28 NOTE — Progress Notes (Signed)
Cardiology Office Note:    Date:  02/04/2018   ID:  Margaret KirschnerArleen Hobbs, DOB [birth year], MRN [id number]  PCP:  Martha ClanShaw, William, MD  Cardiologist:  Dr. Charlton HawsPeter Arian Mcquitty   Electrophysiologist:  n/a  Referring MD: Martha ClanShaw, William, MD   No chief complaint on file.   History of Present Illness:    Margaret Kirschnerrleen Hobbs is a 67 y.o. female with a hx of CAD s/p inferior STEMI in 1/08 tx with BMS to the RCA, HTN, HL, carotid artery disease, prior TIA, psoriatic arthritis. She denies chest pain, shortness of breath, syncope, orthopnea, PND or significant pedal edema. She is mainly limited by her arthritis.  No chest pain Compliant with meds   Prior CV studies that were reviewed today include:    Carotid US 1/17 bilat ICA 1-39  LHC 6/08 LM distal minor stenosis LAD prox 30, mid 60, distal 80; D2 ostial 70 RCA prox 60, mid 50, distal stent patent; post AV segment prox 95 EF 60  Past Medical History:  Diagnosis Date  . Aphasia   . Arthritis   . CAD (coronary artery disease)   . Cerebrovascular disease   . CVA (cerebral vascular accident) (HCC)   . Depressed   . Diabetes mellitus   . DM2 (diabetes mellitus, type 2) (HCC)   . HLD (hyperlipidemia)   . HTN (hypertension)   . Hypercholesteremia   . Hyperparathyroidism   . MI (myocardial infarction) (HCC)   . Osteoporosis   . TIA (transient ischemic attack)   . UTI (urinary tract infection)     Past Surgical History:  Procedure Laterality Date  . ABDOMINAL HYSTERECTOMY    . hysterectomy - unknown type      Current Medications: Current Meds  Medication Sig  . amoxicillin (AMOXIL) 500 MG capsule as directed.  . Ascorbic Acid (VITAMIN C) 500 MG tablet Take 500 mg by mouth daily.    [name]. aspirin 81 MG tablet Take 81 mg by mouth daily.    . cholecalciferol (VITAMIN D) 1000 UNITS tablet Take 2,000 Units by mouth daily.  . clopidogrel (PLAVIX) 75 MG tablet TAKE 1 TABLET BY MOUTH  DAILY  . co-enzyme Q-10 30 MG capsule Take 30 mg by mouth  daily.   . cyanocobalamin 1000 MCG tablet Take 1,000 mcg by mouth daily.   . diclofenac sodium (VOLTAREN) 1 % GEL Voltaren Gel 3 grams to 3 large joints upto TID 3 TUBES with 3 refills  . folic acid (FOLVITE) 1 MG tablet Take 2 tablets (2 mg total) by mouth every morning.  [name]. glipiZIDE-metformin (METAGLIP) 5-500 MG per tablet Take 1 tablet by mouth 2 (two) times daily before a meal.    . HUMIRA 40 MG/0.8ML PSKT INJECT 1 PREFILLED SYRINGE (40MG ) SUBCUTANEOUSLY EVERY 14 DAYS  . hydrALAZINE (APRESOLINE) 50 MG tablet TAKE 1 TABLET BY MOUTH 2  TIMES DAILY. PATIENT NEEDS TO KEEP UPCOMING APPOINTMENT FOR FURTHER REFILLS  . losartan-hydrochlorothiazide (HYZAAR) 100-25 MG tablet Take 1 tablet by mouth daily. Please keep upcoming appt for future refills. Thank you  . methotrexate (RHEUMATREX) 2.5 MG tablet TAKE 6 TABLETS ONCE PER  WEEK  . metoprolol succinate (TOPROL-XL) 100 MG 24 hr tablet Take 1 tablet (100 mg total) by mouth daily. Please keep upcoming appt for future refills. Thank you  . nitroGLYCERIN (NITROSTAT) 0.4 MG SL tablet Place 1 tablet (0.4 mg total) under the tongue every 5 (five) minutes as needed for chest pain (3 doses max).  . nystatin cream (MYCOSTATIN) Apply 1 application topically  2 (two) times daily.  . Rosuvastatin Calcium (CRESTOR PO) Take by mouth daily.  . vitamin A 25956 UNIT capsule Take 10,000 Units by mouth daily.    . vitamin E 400 UNIT capsule Take 400 Units by mouth daily.    . zoledronic acid (RECLAST) 5 MG/100ML SOLN injection Inject 5 mg into the vein once.  . [DISCONTINUED] nitroGLYCERIN (NITROSTAT) 0.4 MG SL tablet Place 0.4 mg under the tongue every 5 (five) minutes as needed for chest pain (3 doses max).     Allergies:   Remicade [infliximab] and Sulfa antibiotics   Social History   Socioeconomic History  . Marital status: Widowed    Spouse name: None  . Number of children: 1  . Years of education: None  . Highest education level: None  Social Needs  .  Financial resource strain: None  . Food insecurity - worry: None  . Food insecurity - inability: None  . Transportation needs - medical: None  . Transportation needs - non-medical: None  Occupational History  . None  Tobacco Use  . Smoking status: Never Smoker  . Smokeless tobacco: Never Used  Substance and Sexual Activity  . Alcohol use: No  . Drug use: No  . Sexual activity: None  Other Topics Concern  . None  Social History Narrative  . None     Family History:  The patient's family history includes Alcohol abuse in her unknown relative; Coronary artery disease in her unknown relative; Diabetes in her brother and unknown relative; Heart disease in her mother; Hyperlipidemia in her mother; Hypertension in her mother, son, and unknown relative.   ROS:   Please see the history of present illness.    Review of Systems  Musculoskeletal: Positive for joint swelling.   All other systems reviewed and are negative.   EKGs/Labs/Other Test Reviewed:    EKG:  EKG is  ordered today.  The ekg ordered today demonstrates NSR, HR 71, normal axis, PRWP, QTc 452 ms, no changes from last tracing. 02/04/18 SR rate 75 poor R wave progression   Recent Labs: No results found for requested labs within last 8760 hours.   Recent Lipid Panel    Component Value Date/Time   CHOL 138 08/24/2016 0912   TRIG 197 (H) 08/24/2016 0912   HDL 40 (L) 08/24/2016 0912   CHOLHDL 3.5 08/24/2016 0912   VLDL 39 (H) 08/24/2016 0912   LDLCALC 59 08/24/2016 0912     Physical Exam:    VS:  BP 128/68   Pulse 77   Ht 5\' 2"  (1.575 m)   Wt 194 lb 3.2 oz (88.1 kg)   SpO2 97%   BMI 35.52 kg/m     Wt Readings from Last 3 Encounters:  02/04/18 194 lb 3.2 oz (88.1 kg)  01/04/18 196 lb (88.9 kg)  08/16/17 199 lb (90.3 kg)     Affect appropriate Obese white female  HEENT: normal Neck supple with no adenopathy JVP normal no bruits no thyromegaly Lungs clear with no wheezing and good diaphragmatic  motion Heart:  S1/S2 no murmur, no rub, gallop or click PMI normal Abdomen: benighn, BS positve, no tenderness, no AAA no bruit.  No HSM or HJR Distal pulses intact with no bruits No edema Neuro non-focal Skin warm and dry No muscular weakness   ASSESSMENT:    1. Coronary artery disease involving native coronary artery of native heart without angina pectoris   2. Essential hypertension   3. HYPERCHOLESTEROLEMIA    PLAN:  In order of problems listed above:  1. CAD - s/p remote MI in 2008 with BMS to the RCA.  She had residual mod non-obstructive CAD at that time treated medically.  She has not had any further angina.   Continue ASA, Plavix, statin, beta-blocker.   2. HTN - Well controlled.  Continue current medications and low sodium Dash type diet.    3. HL - Managed by PCP.  LDL in 9/17 was 63.  Continue Simvastatin.  4. Carotid artery disease - Followed by [facility].    Charlton Haws

## 2018-01-31 ENCOUNTER — Telehealth: Payer: Self-pay | Admitting: Rheumatology

## 2018-01-31 NOTE — Telephone Encounter (Signed)
Last visit: 01/04/2018 Next visit: 06/20/2018 Labs: 01/01/2018 ALT elevated  TB Gold: 05/03/2017  Negative   Okay to refill Humira, per Ladona Ridgelaylor.

## 2018-01-31 NOTE — Telephone Encounter (Signed)
Patient returned the call and we received labs from Dr. Alver FisherShaw's office. Prescription has been sent in.

## 2018-01-31 NOTE — Telephone Encounter (Signed)
Patient calling requesting a refill on Humira Injection. [facility] Patient had last shot on Tuesday.

## 2018-01-31 NOTE — Telephone Encounter (Signed)
Left message on machine and advised patient she is due for labs. Advised patient to please call the office.

## 2018-02-04 ENCOUNTER — Encounter: Payer: Self-pay | Admitting: Cardiovascular Disease

## 2018-02-04 ENCOUNTER — Ambulatory Visit: Payer: Medicare Other | Admitting: Cardiovascular Disease

## 2018-02-04 VITALS — BP 128/68 | HR 77 | Ht 62.0 in | Wt 194.2 lb

## 2018-02-04 DIAGNOSIS — I2510 Atherosclerotic heart disease of native coronary artery without angina pectoris: Secondary | ICD-10-CM | POA: Diagnosis not present

## 2018-02-04 DIAGNOSIS — E7800 Pure hypercholesterolemia, unspecified: Secondary | ICD-10-CM

## 2018-02-04 DIAGNOSIS — I10 Essential (primary) hypertension: Secondary | ICD-10-CM | POA: Diagnosis not present

## 2018-02-04 MED ORDER — NITROGLYCERIN 0.4 MG SL SUBL: 0.4000 mg | SUBLINGUAL_TABLET | SUBLINGUAL | 3 refills | Status: DC | PRN

## 2018-02-04 NOTE — Patient Instructions (Signed)

## 2018-02-11 ENCOUNTER — Other Ambulatory Visit: Payer: Self-pay

## 2018-02-11 ENCOUNTER — Telehealth: Payer: Self-pay | Admitting: *Deleted

## 2018-02-11 MED ORDER — HYDRALAZINE HCL 50 MG PO TABS: ORAL_TABLET | ORAL | 3 refills | Status: DC

## 2018-02-11 MED ORDER — CLOPIDOGREL BISULFATE 75 MG PO TABS: 75.0000 mg | ORAL_TABLET | Freq: Every day | ORAL | 3 refills | Status: DC

## 2018-02-11 NOTE — Telephone Encounter (Signed)
Patient advised we received labs from PCP and it showed elevated liver enzymes,. Decreased MTX from 6 tabs to 4 tabs per week. Patient verbalized understanding. Labs were drawn on 01/31/18. Potassium was low as well with these labs.

## 2018-03-19 ENCOUNTER — Other Ambulatory Visit: Payer: Self-pay | Admitting: *Deleted

## 2018-03-19 MED ORDER — METHOTREXATE 2.5 MG PO TABS: 10.0000 mg | ORAL_TABLET | ORAL | 0 refills | Status: DC

## 2018-03-19 NOTE — Telephone Encounter (Signed)
Last visit: 01/04/2018 Next visit: 06/20/2018 Labs: 01/01/2018 ALT elevated   Okay to refill per Dr. Corliss Skainseveshwar

## 2018-03-27 ENCOUNTER — Telehealth: Payer: Self-pay | Admitting: Rheumatology

## 2018-03-27 ENCOUNTER — Ambulatory Visit (HOSPITAL_COMMUNITY): Payer: Medicare Other

## 2018-03-27 NOTE — Telephone Encounter (Signed)
Calling to clarify two rx for MTX. Which one do they distribute ? Please call to advise. [phone] ref #[id number]

## 2018-03-28 NOTE — Telephone Encounter (Signed)
Spoke with pharmacy and they have received fax that verified prescription.

## 2018-04-01 ENCOUNTER — Other Ambulatory Visit (HOSPITAL_COMMUNITY): Payer: Self-pay | Admitting: *Deleted

## 2018-04-02 ENCOUNTER — Ambulatory Visit (HOSPITAL_COMMUNITY)
Admission: RE | Admit: 2018-04-02 | Discharge: 2018-04-02 | Disposition: A | Discharge status: Home / Self Care | Payer: Medicare Other | Source: Ambulatory Visit | Attending: Internal Medicine | Admitting: Internal Medicine

## 2018-04-02 DIAGNOSIS — M810 Age-related osteoporosis without current pathological fracture: Secondary | ICD-10-CM | POA: Diagnosis not present

## 2018-04-02 MED ORDER — ZOLEDRONIC ACID 5 MG/100ML IV SOLN
5.0000 mg | Freq: Once | INTRAVENOUS | Status: AC
  Administered 2018-04-02: 5 mg via INTRAVENOUS

## 2018-04-02 MED ORDER — ZOLEDRONIC ACID 5 MG/100ML IV SOLN
INTRAVENOUS | Status: AC
  Administered 2018-04-02: 10:00:00 5 mg via INTRAVENOUS
  Filled 2018-04-02: qty 100

## 2018-04-19 ENCOUNTER — Other Ambulatory Visit: Payer: Self-pay | Admitting: Physician Assistant

## 2018-04-19 NOTE — Telephone Encounter (Signed)
Last visit: 01/04/2018 Next visit: 06/20/2018 Labs: 03/12/18 stable TB Gold: 05/03/17   Okay to refill per Dr. Corliss Skainseveshwar

## 2018-04-30 ENCOUNTER — Other Ambulatory Visit: Payer: Self-pay | Admitting: Cardiovascular Disease

## 2018-05-10 ENCOUNTER — Other Ambulatory Visit: Payer: Self-pay | Admitting: Cardiovascular Disease

## 2018-06-07 ENCOUNTER — Other Ambulatory Visit: Payer: Self-pay | Admitting: Rheumatology

## 2018-06-07 NOTE — Telephone Encounter (Signed)
Last visit: 01/04/2018 Next visit: 06/20/2018 Labs:03/12/18 stable  Left message to advise patient she needs to update labs.   Okay to refill 30 day supply per Dr. Corliss Skainseveshwar

## 2018-06-14 NOTE — Progress Notes (Signed)
Office Visit Note  Patient: Margaret Hobbs             Date of Birth: Apr 11, 1951           MRN: [id number]             PCP: Martha Clan, MD Referring: Martha Clan, MD Visit Date: 06/20/2018 Occupation: @GUAROCC @    Subjective:  Plantar fasciitis   History of Present Illness: Margaret Hobbs is a 67 y.o. female with history of psoriatic arthritis and osteoarthritis.  Patient is on Humira 40 mg subcutaneous injections every 14 days, Methotrexate 4 tablets by mouth once weekly, and folic acid 2 mg daily.  She has not missed any doses recently.  She denies any joint pain or joint swelling at this time.  She denies any Achilles tendinitis.  She denies any SI joint pain at this time.  She continues to have mild plantar fasciitis bilaterally but has not been performing stretching exercises.  She states she has minimal morning stiffness lasting 5 to 10 minutes.      Activities of Daily Living:  Patient reports morning stiffness for 5-10  minutes.   Patient Denies nocturnal pain.  Difficulty dressing/grooming: Denies Difficulty climbing stairs: Reports Difficulty getting out of chair: Reports Difficulty using hands for taps, buttons, cutlery, and/or writing: Denies   Review of Systems  Constitutional: Negative for fatigue.  HENT: Negative for mouth sores, mouth dryness and nose dryness.   Eyes: Negative for pain, visual disturbance and dryness.  Respiratory: Negative for cough, hemoptysis, shortness of breath and difficulty breathing.   Cardiovascular: Negative for chest pain, palpitations, hypertension and swelling in legs/feet.  Gastrointestinal: Positive for constipation and diarrhea. Negative for blood in stool.  Endocrine: Negative for increased urination.  Genitourinary: Negative for painful urination.  Musculoskeletal: Positive for morning stiffness. Negative for arthralgias, joint pain, joint swelling, myalgias, muscle weakness, muscle tenderness and myalgias.    Skin: Negative for color change, pallor, rash, hair loss, nodules/bumps, skin tightness, ulcers and sensitivity to sunlight.  Allergic/Immunologic: Negative for susceptible to infections.  Neurological: Negative for dizziness, numbness, headaches and weakness.  Hematological: Negative for swollen glands.  Psychiatric/Behavioral: Negative for depressed mood and sleep disturbance. The patient is not nervous/anxious.     PMFS History:  Patient Active Problem List   Diagnosis Date Noted  . Carotid artery disease (HCC) 01/24/2017  . Primary osteoarthritis of both knees 11/29/2016  . Other psoriasis 11/28/2016  . Psoriatic arthritis (HCC) 11/28/2016  . High risk medication use 11/28/2016  . Type 2 diabetes mellitus (HCC) 03/30/2009  . HYPERCHOLESTEROLEMIA 03/30/2009  . Dyslipidemia 03/30/2009  . Essential hypertension 03/30/2009  . MYOCARDIAL INFARCTION 03/30/2009  . Coronary artery disease involving native coronary artery of native heart without angina pectoris 03/30/2009  . Cerebral artery occlusion with cerebral infarction (HCC) 03/30/2009  . URINARY TRACT INFECTION 03/30/2009  . ARTHRITIS 03/30/2009  . HYPERPARATHYROIDISM, HX OF 03/30/2009    Past Medical History:  Diagnosis Date  . Aphasia   . Arthritis   . CAD (coronary artery disease)   . Cerebrovascular disease   . CVA (cerebral vascular accident) (HCC)   . Depressed   . Diabetes mellitus   . DM2 (diabetes mellitus, type 2) (HCC)   . HLD (hyperlipidemia)   . HTN (hypertension)   . Hypercholesteremia   . Hyperparathyroidism   . MI (myocardial infarction) (HCC)   . Osteoporosis   . TIA (transient ischemic attack)   . UTI (urinary tract infection)  Family History  Problem Relation Age of Onset  . Heart disease Mother        pacemaker  . Hypertension Mother   . Hyperlipidemia Mother   . Diabetes Brother        pre-diabetic   . Hypertension Son   . Diabetes Unknown   . Hypertension Unknown   . Alcohol abuse  Unknown   . Coronary artery disease Unknown    Past Surgical History:  Procedure Laterality Date  . ABDOMINAL HYSTERECTOMY    . hysterectomy - unknown type     Social History   Social History Narrative  . Not on file     Objective: Vital Signs: BP 119/68 (BP Location: Left Arm, Patient Position: Sitting, Cuff Size: Normal)   Pulse 74   Resp 16   Ht 5' 1.75" (1.568 m)   Wt 191 lb 8 oz (86.9 kg)   BMI 35.31 kg/m    Physical Exam  Constitutional: She is oriented to person, place, and time. She appears well-developed and well-nourished.  HENT:  Head: Normocephalic and atraumatic.  Eyes: Conjunctivae and EOM are normal.  Neck: Normal range of motion.  Cardiovascular: Normal rate, regular rhythm, normal heart sounds and intact distal pulses.  Pulmonary/Chest: Effort normal and breath sounds normal.  Abdominal: Soft. Bowel sounds are normal.  Lymphadenopathy:    She has no cervical adenopathy.  Neurological: She is alert and oriented to person, place, and time.  Skin: Skin is warm and dry. Capillary refill takes less than 2 seconds.  Psychiatric: She has a normal mood and affect. Her behavior is normal.  Nursing note and vitals reviewed.    Musculoskeletal Exam: C-spine, thoracic spine, lumbar spine limited range of motion.  Shoulder joints, elbow joints, wrist joints, MCPs, PIPs, DIPs good range of motion with no synovitis.  She has synovial thickening of her left second third MCP joints.  She has DIP synovial thickening.  Hip joints, ankle joints, MTPs, PIPs, DIPs good range of motion with no synovitis.  No warmth or effusion of bilateral knee joints.  She has limited extension of bilateral knee joints.  She has no SI joint tenderness on exam.  She has mild plantar fasciitis bilaterally.  No Achilles tendinitis.  CDAI Exam: CDAI Homunculus Exam:   Joint Counts:  CDAI Tender Joint count: 0 CDAI Swollen Joint count: 0  Global Assessments:  Patient Global Assessment:  6 Provider Global Assessment: 6  CDAI Calculated Score: 12    Investigation: No additional findings. Labs: 03/12/2018 stable  TB Gold: 05/03/2017 Negative  Imaging: No results found.  Speciality Comments: No specialty comments available.    Procedures:  No procedures performed Allergies: Remicade [infliximab] and Sulfa antibiotics   Assessment / Plan:     Visit Diagnoses: Psoriatic arthritis (HCC): She has no synovitis or dactylitis on exam.  She has no joint pain or joint swelling at this time.  She has no SI joint tenderness.  She has mild plantar fasciitis bilaterally.  She was given a handout of exercises that she can perform at home.  She has no Achilles tendinitis.  Her psoriasis has cleared.  She will continue on the current treatment regimen of Humira 40 mg subcutaneous injections every 14 days, methotrexate 4 tablets by mouth once weekly, and folic acid 1 mg daily.  She does not need any refills at this time.   Other psoriasis: She has no psoriasis at this time.  High risk medication use - Humira, MTX, folic acid.  CBC,  CMP, and TB gold were drawn today.  She will return in September and every 3 months for lab work to monitor for drug toxicity.  Primary osteoarthritis of both knees: No warmth or effusion noted.  She has limited extension of bilateral knee joints with bilateral knee crepitus.  She has no discomfort in her knee joints at this time.  Other medical conditions are listed as follows:  History of diabetes mellitus  History of hypertension  History of coronary artery disease - Status post MI  History of stroke - with right hemiparesis   History of hypercholesterolemia  Dyslipidemia    Orders: No orders of the defined types were placed in this encounter.  No orders of the defined types were placed in this encounter.   Face-to-face time spent with patient was 30 minutes. >50% of time was spent in counseling and coordination of care.  Follow-Up  Instructions: Return in about 5 months (around 11/20/2018) for Psoriatic arthritis, Osteoarthritis.   Gearldine Bienenstockaylor M Dale, PA-C   I examined and evaluated the patient with Sherron Alesaylor Dale PA.  Patient had no synovitis on examination today.  She will continue on current regimen.  The plan of care was discussed as noted above.  Pollyann SavoyShaili Deveshwar, MD Note - This record has been created using Animal nutritionistDragon software.  Chart creation errors have been sought, but may not always  have been located. Such creation errors do not reflect on  the standard of medical care.

## 2018-06-20 ENCOUNTER — Ambulatory Visit (INDEPENDENT_AMBULATORY_CARE_PROVIDER_SITE_OTHER): Payer: Medicare Other | Admitting: Rheumatology

## 2018-06-20 ENCOUNTER — Encounter: Payer: Self-pay | Admitting: Rheumatology

## 2018-06-20 VITALS — BP 119/68 | HR 74 | Resp 16 | Ht 61.75 in | Wt 191.5 lb

## 2018-06-20 DIAGNOSIS — Z8639 Personal history of other endocrine, nutritional and metabolic disease: Secondary | ICD-10-CM

## 2018-06-20 DIAGNOSIS — L4050 Arthropathic psoriasis, unspecified: Secondary | ICD-10-CM

## 2018-06-20 DIAGNOSIS — Z79899 Other long term (current) drug therapy: Secondary | ICD-10-CM | POA: Diagnosis not present

## 2018-06-20 DIAGNOSIS — Z8679 Personal history of other diseases of the circulatory system: Secondary | ICD-10-CM | POA: Diagnosis not present

## 2018-06-20 DIAGNOSIS — L408 Other psoriasis: Secondary | ICD-10-CM

## 2018-06-20 DIAGNOSIS — M170 Bilateral primary osteoarthritis of knee: Secondary | ICD-10-CM

## 2018-06-20 DIAGNOSIS — Z8673 Personal history of transient ischemic attack (TIA), and cerebral infarction without residual deficits: Secondary | ICD-10-CM

## 2018-06-20 DIAGNOSIS — E785 Hyperlipidemia, unspecified: Secondary | ICD-10-CM

## 2018-06-20 NOTE — Patient Instructions (Signed)

## 2018-06-21 NOTE — Progress Notes (Signed)
CBC stable. Glucose is 143. Potassium borderline low. Forward to PCP. Creatinine elevated and GFR mildly low.  Please advise patient to avoid NSAIDs.  If she is clinically doing well she can discontinue MTX and stay on Humira.  If she would like to continue MTX we can continue to monitor renal function.

## 2018-06-22 LAB — COMPLETE METABOLIC PANEL WITH GFR
AG RATIO: 1.2 (calc) (ref 1.0–2.5)
ALT: 15 U/L (ref 6–29)
AST: 17 U/L (ref 10–35)
Albumin: 4.2 g/dL (ref 3.6–5.1)
Alkaline phosphatase (APISO): 41 U/L (ref 33–130)
BUN/Creatinine Ratio: 25 (calc) — ABNORMAL HIGH (ref 6–22)
BUN: 29 mg/dL — AB (ref 7–25)
CALCIUM: 10.2 mg/dL (ref 8.6–10.4)
CHLORIDE: 99 mmol/L (ref 98–110)
CO2: 30 mmol/L (ref 20–32)
Creat: 1.17 mg/dL — ABNORMAL HIGH (ref 0.50–0.99)
GFR, Est African American: 57 mL/min/{1.73_m2} — ABNORMAL LOW (ref >=60)
GFR, Est Non African American: 49 mL/min/{1.73_m2} — ABNORMAL LOW (ref >=60)
GLUCOSE: 143 mg/dL — AB (ref 65–99)
Globulin: 3.4 g/dL (calc) (ref 1.9–3.7)
POTASSIUM: 3.4 mmol/L — AB (ref 3.5–5.3)
Sodium: 139 mmol/L (ref 135–146)
Total Bilirubin: 0.5 mg/dL (ref 0.2–1.2)
Total Protein: 7.6 g/dL (ref 6.1–8.1)

## 2018-06-22 LAB — CBC WITH DIFFERENTIAL/PLATELET
BASOS PCT: 1.2 %
Basophils Absolute: 79 cells/uL (ref 0–200)
EOS PCT: 4.7 %
Eosinophils Absolute: 310 cells/uL (ref 15–500)
HCT: 38.6 % (ref 35.0–45.0)
Hemoglobin: 12.6 g/dL (ref 11.7–15.5)
Lymphs Abs: 1353 cells/uL (ref 850–3900)
MCH: 31.3 pg (ref 27.0–33.0)
MCHC: 32.6 g/dL (ref 32.0–36.0)
MCV: 96 fL (ref 80.0–100.0)
MONOS PCT: 8.1 %
MPV: 13.2 fL — AB (ref 7.5–12.5)
NEUTROS PCT: 65.5 %
Neutro Abs: 4323 cells/uL (ref 1500–7800)
PLATELETS: 293 10*3/uL (ref 140–400)
RBC: 4.02 10*6/uL (ref 3.80–5.10)
RDW: 13 % (ref 11.0–15.0)
TOTAL LYMPHOCYTE: 20.5 %
WBC: 6.6 10*3/uL (ref 3.8–10.8)
WBCMIX: 535 {cells}/uL (ref 200–950)

## 2018-06-22 LAB — QUANTIFERON-TB GOLD PLUS
MITOGEN-NIL: 7.98 [IU]/mL
NIL: 0.02 IU/mL
QuantiFERON-TB Gold Plus: NEGATIVE
TB1-NIL: 0 IU/mL
TB2-NIL: 0 [IU]/mL

## 2018-06-24 NOTE — Progress Notes (Signed)
TB gold negative

## 2018-07-22 ENCOUNTER — Telehealth: Payer: Self-pay | Admitting: Rheumatology

## 2018-07-22 MED ORDER — ADALIMUMAB 40 MG/0.8ML ~~LOC~~ PSKT: 40.0000 mg | PREFILLED_SYRINGE | SUBCUTANEOUS | 0 refills | Status: DC

## 2018-07-22 NOTE — Telephone Encounter (Signed)
Patient left a message stating [facility] should have sent a request for a refill on her medication last Friday. Per patient, please send in refill. She is due for medication this Friday.

## 2018-07-22 NOTE — Telephone Encounter (Signed)
Last Visit: 06/20/18 Next visit: 11/28/18 Labs: 06/20/18 CBC stable. Glucose is 143. Potassium borderline low.  TB Gold: 06/20/18 Neg   Okay to refill per Dr. Corliss Skainseveshwar

## 2018-07-22 NOTE — Telephone Encounter (Signed)
[facility] left a message requesting a rx for patients Humira. Phone# [phone]

## 2018-07-23 ENCOUNTER — Telehealth: Payer: Self-pay | Admitting: Rheumatology

## 2018-07-23 NOTE — Telephone Encounter (Signed)
Patient advised prescription was sent to the pharmacy 07/22/18 and may be in the processing stage. Patient advised if she has not received her prescription by Friday when her injection is due she may contact the office for a sample.

## 2018-07-23 NOTE — Telephone Encounter (Signed)
Patient called Margaret Hobbs this morning to schedule the delivery of her Humira and they state they have not received the prescription refill from Dr. Corliss Skainseveshwar.  Patient is requesting a return call.

## 2018-07-25 ENCOUNTER — Telehealth: Payer: Self-pay | Admitting: Rheumatology

## 2018-07-25 NOTE — Telephone Encounter (Signed)
Patient left a voicemail stating that [facility] "still has not received the paperwork from Dr. Fatima Sangereveshwar's office so they can refill her prescription."  Patient is requesting a return call.

## 2018-07-25 NOTE — Telephone Encounter (Signed)
Called [facility] and provided a verbal prescription for the Humira since they did not receive original. I called patient and advised her. I asked if patient needed a sample and patient states she will contact the office if she does not receive her medication from [facility] in time to inject.

## 2018-09-25 ENCOUNTER — Other Ambulatory Visit: Payer: Self-pay | Admitting: Rheumatology

## 2018-09-25 NOTE — Telephone Encounter (Addendum)
Last Visit: 06/20/18 Next Visit: 11/28/18 Labs: 06/20/18 CBC stable. Glucose is 143. Potassium borderline low. Creatinine elevated and GFR mildly low. TB gold: 06/20/18 Neg   Left message to advise patient she is due for labs.   Okay to refill 30 day supply per Dr. Corliss Skains

## 2018-10-01 ENCOUNTER — Other Ambulatory Visit: Payer: Self-pay | Admitting: *Deleted

## 2018-10-01 MED ORDER — FOLIC ACID 1 MG PO TABS: 2.0000 mg | ORAL_TABLET | Freq: Every morning | ORAL | 4 refills | Status: DC

## 2018-10-01 NOTE — Telephone Encounter (Signed)
Refill request received via fax  Last Visit: 06/20/18 Next Visit: 11/28/18  Okay to refill per Dr. Corliss Skains

## 2018-10-09 ENCOUNTER — Other Ambulatory Visit (HOSPITAL_COMMUNITY): Payer: Self-pay

## 2018-10-10 ENCOUNTER — Encounter (HOSPITAL_COMMUNITY): Payer: Self-pay

## 2018-10-10 ENCOUNTER — Ambulatory Visit (HOSPITAL_COMMUNITY)
Admission: RE | Admit: 2018-10-10 | Discharge: 2018-10-10 | Disposition: A | Discharge status: Home / Self Care | Payer: Medicare Other | Source: Ambulatory Visit | Attending: Internal Medicine | Admitting: Internal Medicine

## 2018-10-10 DIAGNOSIS — M810 Age-related osteoporosis without current pathological fracture: Secondary | ICD-10-CM | POA: Insufficient documentation

## 2018-10-10 MED ORDER — ZOLEDRONIC ACID 5 MG/100ML IV SOLN
5.0000 mg | Freq: Once | INTRAVENOUS | Status: AC
  Administered 2018-10-10: 5 mg via INTRAVENOUS

## 2018-10-10 MED ORDER — ZOLEDRONIC ACID 5 MG/100ML IV SOLN
INTRAVENOUS | Status: AC
  Administered 2018-10-10: 5 mg via INTRAVENOUS
  Filled 2018-10-10: qty 100

## 2018-10-13 ENCOUNTER — Other Ambulatory Visit: Payer: Self-pay | Admitting: Rheumatology

## 2018-10-14 NOTE — Telephone Encounter (Signed)
Last Visit: 06/20/18 Next Visit: 11/28/18 Labs: 09/23/18 Elevated glucose  TB Gold: 06/20/18 Neg   Okay to refill per Dr. Corliss Skains

## 2018-11-04 ENCOUNTER — Other Ambulatory Visit: Payer: Self-pay

## 2018-11-04 MED ORDER — RAMIPRIL 10 MG PO CAPS: 10.0000 mg | ORAL_CAPSULE | Freq: Every day | ORAL | 3 refills | Status: DC

## 2018-11-04 MED ORDER — HYDROCHLOROTHIAZIDE 25 MG PO TABS: 25.0000 mg | ORAL_TABLET | Freq: Every day | ORAL | 3 refills | Status: DC

## 2018-11-04 NOTE — Progress Notes (Unsigned)
Left message for patient to call back. [facility] sent request for alteranative medication due to out of stock on Losartan/HCTZ 100-25 mg . Dr. Eden Emms changed to Altace 10 mg by mouth daily and HCTZ 25 mg by mouth daily.

## 2018-11-08 ENCOUNTER — Telehealth: Payer: Self-pay | Admitting: *Deleted

## 2018-11-08 NOTE — Telephone Encounter (Signed)
Called patient, left message to contact [facility] for refill, provided call back number. Also advised to contact office with any questions or concerns.

## 2018-11-08 NOTE — Telephone Encounter (Signed)
[facility] contacted our office via fax to advised they have been trying to reach patient to fill Humira. They have been unsuccessful. Call back number [phone]

## 2018-11-14 NOTE — Progress Notes (Deleted)
Office Visit Note  Patient: Margaret Hobbs             Date of Birth: [birth year]           MRN: [id number]             PCP: Martha Clan, MD Referring: Martha Clan, MD Visit Date: 11/28/2018 Occupation: @GUAROCC @  Subjective:  No chief complaint on file.   History of Present Illness: Margaret Hobbs is a 67 y.o. female ***   Activities of Daily Living:  Patient reports morning stiffness for *** {minute/hour:19697}.   Patient {ACTIONS;DENIES/REPORTS:21021675::"Denies"} nocturnal pain.  Difficulty dressing/grooming: {ACTIONS;DENIES/REPORTS:21021675::"Denies"} Difficulty climbing stairs: {ACTIONS;DENIES/REPORTS:21021675::"Denies"} Difficulty getting out of chair: {ACTIONS;DENIES/REPORTS:21021675::"Denies"} Difficulty using hands for taps, buttons, cutlery, and/or writing: {ACTIONS;DENIES/REPORTS:21021675::"Denies"}  No Rheumatology ROS completed.   PMFS History:  Patient Active Problem List   Diagnosis Date Noted  . Carotid artery disease (HCC) 01/24/2017  . Primary osteoarthritis of both knees 11/29/2016  . Other psoriasis 11/28/2016  . Psoriatic arthritis (HCC) 11/28/2016  . High risk medication use 11/28/2016  . Type 2 diabetes mellitus (HCC) 03/30/2009  . HYPERCHOLESTEROLEMIA 03/30/2009  . Dyslipidemia 03/30/2009  . Essential hypertension 03/30/2009  . MYOCARDIAL INFARCTION 03/30/2009  . Coronary artery disease involving native coronary artery of native heart without angina pectoris 03/30/2009  . Cerebral artery occlusion with cerebral infarction (HCC) 03/30/2009  . URINARY TRACT INFECTION 03/30/2009  . ARTHRITIS 03/30/2009  . HYPERPARATHYROIDISM, HX OF 03/30/2009    Past Medical History:  Diagnosis Date  . Aphasia   . Arthritis   . CAD (coronary artery disease)   . Cerebrovascular disease   . CVA (cerebral vascular accident) (HCC)   . Depressed   . Diabetes mellitus   . DM2 (diabetes mellitus, type 2) (HCC)   . HLD (hyperlipidemia)   . HTN  (hypertension)   . Hypercholesteremia   . Hyperparathyroidism   . MI (myocardial infarction) (HCC)   . Osteoporosis   . TIA (transient ischemic attack)   . UTI (urinary tract infection)     Family History  Problem Relation Age of Onset  . Heart disease Mother        pacemaker  . Hypertension Mother   . Hyperlipidemia Mother   . Diabetes Brother        pre-diabetic   . Hypertension Son   . Diabetes Unknown   . Hypertension Unknown   . Alcohol abuse Unknown   . Coronary artery disease Unknown    Past Surgical History:  Procedure Laterality Date  . ABDOMINAL HYSTERECTOMY    . hysterectomy - unknown type     Social History   Social History Narrative  . Not on file    Objective: Vital Signs: There were no vitals taken for this visit.   Physical Exam   Musculoskeletal Exam: ***  CDAI Exam: CDAI Score: Not documented Patient Global Assessment: Not documented; Provider Global Assessment: Not documented Swollen: Not documented; Tender: Not documented Joint Exam   Not documented   There is currently no information documented on the homunculus. Go to the Rheumatology activity and complete the homunculus joint exam.  Investigation: No additional findings.  Imaging: No results found.  Recent Labs: Lab Results  Component Value Date   WBC 6.6 06/20/2018   HGB 12.6 06/20/2018   PLT 293 06/20/2018   NA 139 06/20/2018   K 3.4 (L) 06/20/2018   CL 99 06/20/2018   CO2 30 06/20/2018   GLUCOSE 143 (H) 06/20/2018   BUN 29 (H) 06/20/2018  CREATININE 1.17 (H) 06/20/2018   BILITOT 0.5 06/20/2018   ALKPHOS 64 08/01/2016   AST 17 06/20/2018   ALT 15 06/20/2018   PROT 7.6 06/20/2018   ALBUMIN 3.1 (L) 08/01/2016   CALCIUM 10.2 06/20/2018   GFRAA 57 (L) 06/20/2018   QFTBGOLDPLUS NEGATIVE 06/20/2018    Speciality Comments: No specialty comments available.  Procedures:  No procedures performed Allergies: Remicade [infliximab] and Sulfa antibiotics   Assessment /  Plan:     Visit Diagnoses: No diagnosis found.   Orders: No orders of the defined types were placed in this encounter.  No orders of the defined types were placed in this encounter.   Face-to-face time spent with patient was *** minutes. Greater than 50% of time was spent in counseling and coordination of care.  Follow-Up Instructions: No follow-ups on file.   Ellen HenriMarissa C Hamdan Toscano, CMA  Note - This record has been created using Animal nutritionistDragon software.  Chart creation errors have been sought, but may not always  have been located. Such creation errors do not reflect on  the standard of medical care.

## 2018-11-19 ENCOUNTER — Encounter: Payer: Self-pay | Admitting: Rheumatology

## 2018-11-26 NOTE — Progress Notes (Signed)
Office Visit Note  Patient: Margaret Hobbs             Date of Birth: 1951-10-19           MRN: [id number]             PCP: Martha ClanShaw, William, MD Referring: Martha ClanShaw, William, MD Visit Date: 12/05/2018 Occupation: @GUAROCC @  Subjective:  Medication management.    History of Present Illness: Margaret Hobbs is a 67 y.o. female with history of psoriatic arthritis and osteoarthritis. She is no longer taking methotrexate due to decline in kidney function but continues to take Humira 40 every 14 days.  Denies missing any doses. Overall patient is doing well but states psoriasis has moved to the back and inside of the ear. It is very bothersome and uses hydrocortisone to help relive the itching but does not last long. Denies any join pain or swelling.  Does report some muscle pain her left calf.  She reports she has had her flu shot and both pneumonia vaccines.  Activities of Daily Living:  Patient reports morning stiffness for 10 minutes.   Patient Denies nocturnal pain.  Difficulty dressing/grooming: Denies Difficulty climbing stairs: Reports Difficulty getting out of chair: Denies Difficulty using hands for taps, buttons, cutlery, and/or writing: Denies  Review of Systems  Constitutional: Negative for fatigue, night sweats, weight gain and weight loss.  HENT: Negative for mouth sores, trouble swallowing, trouble swallowing, mouth dryness and nose dryness.   Eyes: Negative for pain, redness, visual disturbance and dryness.  Respiratory: Negative for cough, shortness of breath, wheezing and difficulty breathing.   Cardiovascular: Negative for chest pain, palpitations, hypertension, irregular heartbeat and swelling in legs/feet.  Gastrointestinal: Positive for diarrhea. Negative for blood in stool and constipation.  Endocrine: Negative for increased urination.  Genitourinary: Negative for vaginal dryness.  Musculoskeletal: Positive for muscle tenderness. Negative for arthralgias,  joint pain, joint swelling, myalgias, muscle weakness, morning stiffness and myalgias.  Skin: Positive for rash. Negative for color change, hair loss, skin tightness, ulcers and sensitivity to sunlight.  Allergic/Immunologic: Negative for susceptible to infections.  Neurological: Negative for dizziness, headaches, memory loss, night sweats and weakness.  Hematological: Negative for swollen glands.  Psychiatric/Behavioral: Negative for depressed mood and sleep disturbance. The patient is nervous/anxious (due to traveling).     PMFS History:  Patient Active Problem List   Diagnosis Date Noted  . Carotid artery disease (HCC) 01/24/2017  . Primary osteoarthritis of both knees 11/29/2016  . Other psoriasis 11/28/2016  . Psoriatic arthritis (HCC) 11/28/2016  . High risk medication use 11/28/2016  . Type 2 diabetes mellitus (HCC) 03/30/2009  . HYPERCHOLESTEROLEMIA 03/30/2009  . Dyslipidemia 03/30/2009  . Essential hypertension 03/30/2009  . MYOCARDIAL INFARCTION 03/30/2009  . Coronary artery disease involving native coronary artery of native heart without angina pectoris 03/30/2009  . Cerebral artery occlusion with cerebral infarction (HCC) 03/30/2009  . URINARY TRACT INFECTION 03/30/2009  . ARTHRITIS 03/30/2009  . HYPERPARATHYROIDISM, HX OF 03/30/2009    Past Medical History:  Diagnosis Date  . Aphasia   . Arthritis   . CAD (coronary artery disease)   . Cerebrovascular disease   . CVA (cerebral vascular accident) (HCC)   . Depressed   . Diabetes mellitus   . DM2 (diabetes mellitus, type 2) (HCC)   . HLD (hyperlipidemia)   . HTN (hypertension)   . Hypercholesteremia   . Hyperparathyroidism   . MI (myocardial infarction) (HCC)   . Osteoporosis   . TIA (transient ischemic attack)   .  UTI (urinary tract infection)     Family History  Problem Relation Age of Onset  . Heart disease Mother        pacemaker  . Hypertension Mother   . Hyperlipidemia Mother   . Diabetes Brother          pre-diabetic   . Hypertension Son   . Diabetes Other   . Hypertension Other   . Alcohol abuse Other   . Coronary artery disease Other    Past Surgical History:  Procedure Laterality Date  . ABDOMINAL HYSTERECTOMY    . hysterectomy - unknown type     Social History   Social History Narrative  . Not on file    Objective: Vital Signs: BP 102/62 (BP Location: Left Arm, Patient Position: Sitting, Cuff Size: Normal)   Pulse 72   Resp 13   Ht 5\' 2"  (1.575 m)   Wt 196 lb 12.8 oz (89.3 kg)   BMI 36.00 kg/m    Physical Exam Vitals signs and nursing note reviewed.  Constitutional:      Appearance: She is well-developed.  HENT:     Head: Normocephalic and atraumatic.  Eyes:     Conjunctiva/sclera: Conjunctivae normal.  Neck:     Musculoskeletal: Normal range of motion.  Cardiovascular:     Rate and Rhythm: Normal rate and regular rhythm.     Heart sounds: Normal heart sounds.  Pulmonary:     Effort: Pulmonary effort is normal.     Breath sounds: Normal breath sounds.  Abdominal:     General: Bowel sounds are normal.     Palpations: Abdomen is soft.  Lymphadenopathy:     Cervical: No cervical adenopathy.  Skin:    General: Skin is warm and dry.     Capillary Refill: Capillary refill takes less than 2 seconds.  Neurological:     Mental Status: She is alert and oriented to person, place, and time.     Comments: Hemiparesis due to previous stroke  Psychiatric:        Behavior: Behavior normal.      Musculoskeletal Exam: C-spine good range of motion.  She has thoracic kyphosis.  She has no discomfort over SI joints.  Shoulder joints, elbow joints, wrist joints with good range of motion.  She is some DIP and PIP thickening but no synovitis was noted.  Hip joints knee joints ankles MTPs PIPs were good range of motion.  There was no evidence of Achilles tendinitis or plantar fasciitis.  CDAI Exam: CDAI Score: Not documented Patient Global Assessment: Not documented;  Provider Global Assessment: Not documented Swollen: Not documented; Tender: Not documented Joint Exam   Not documented   There is currently no information documented on the homunculus. Go to the Rheumatology activity and complete the homunculus joint exam.  Investigation: No additional findings.  Imaging: No results found.  Recent Labs: Lab Results  Component Value Date   WBC 6.6 06/20/2018   HGB 12.6 06/20/2018   PLT 293 06/20/2018   NA 139 06/20/2018   K 3.4 (L) 06/20/2018   CL 99 06/20/2018   CO2 30 06/20/2018   GLUCOSE 143 (H) 06/20/2018   BUN 29 (H) 06/20/2018   CREATININE 1.17 (H) 06/20/2018   BILITOT 0.5 06/20/2018   ALKPHOS 64 08/01/2016   AST 17 06/20/2018   ALT 15 06/20/2018   PROT 7.6 06/20/2018   ALBUMIN 3.1 (L) 08/01/2016   CALCIUM 10.2 06/20/2018   GFRAA 57 (L) 06/20/2018   QFTBGOLDPLUS NEGATIVE 06/20/2018  Speciality Comments: No specialty comments available.  Procedures:  No procedures performed Allergies: Remicade [infliximab] and Sulfa antibiotics   Assessment / Plan:     Visit Diagnoses: Psoriatic arthritis (HCC)-she has no active synovitis on examination today.  She has some synovial thickening.  Other psoriasis-she has psoriasis lesions behind her left ear.  She has been using over-the-counter topical agents.  I offered topical steroid prescription which she declined.  She does not want to change her regimen at this point.  We have discontinued methotrexate due to elevation of creatinine.  High risk medication use - Humira 40 mg every 14 days.  Most recent CBC/CMP within normal limits on 09/23/2018.  Patient reports that she had labs done at Dr. Margaretmary Eddy office in November which are not available to [place].  We will request those results.  Next CBC/CMP due at the end of the month and then every 3 months.  Standing orders placed. Recommend yearly skin exam.  Recommend Shingrix vaccine as indicated. - Plan: CBC with Differential/Platelet, COMPLETE  METABOLIC PANEL WITH GFR  Primary osteoarthritis of both knees-weight loss diet and exercise was discussed.  Other medical problems are listed as follows:  History of diabetes mellitus  History of hypertension  History of stroke  History of coronary artery disease  History of hypercholesterolemia  Dyslipidemia   Orders: No orders of the defined types were placed in this encounter.  No orders of the defined types were placed in this encounter.   Follow-Up Instructions: Return in about 5 months (around 05/06/2019) for Psoriatic arthritis.   Pollyann Savoy, MD  Note - This record has been created using Animal nutritionist.  Chart creation errors have been sought, but may not always  have been located. Such creation errors do not reflect on  the standard of medical care.

## 2018-11-28 ENCOUNTER — Ambulatory Visit: Payer: Medicare Other | Admitting: Rheumatology

## 2018-11-29 ENCOUNTER — Telehealth: Payer: Self-pay | Admitting: Pharmacy Technician

## 2018-11-29 NOTE — Telephone Encounter (Signed)
Received notification from [employer] regarding a prior authorization for Humira. Authorization has been APPROVED from 11/29/18 to 12/25/19.   Will send document to scan center.  Authorization # [id number] Phone # [phone]  9:18 AM Dorthula Nettlesachael N Sarenity Ramaker, CPhT

## 2018-12-05 ENCOUNTER — Ambulatory Visit: Payer: Medicare Other | Admitting: Rheumatology

## 2018-12-05 ENCOUNTER — Encounter: Payer: Self-pay | Admitting: Physician Assistant

## 2018-12-05 VITALS — BP 102/62 | HR 72 | Resp 13 | Ht 62.0 in | Wt 196.8 lb

## 2018-12-05 DIAGNOSIS — Z8639 Personal history of other endocrine, nutritional and metabolic disease: Secondary | ICD-10-CM

## 2018-12-05 DIAGNOSIS — L4050 Arthropathic psoriasis, unspecified: Secondary | ICD-10-CM

## 2018-12-05 DIAGNOSIS — L408 Other psoriasis: Secondary | ICD-10-CM | POA: Diagnosis not present

## 2018-12-05 DIAGNOSIS — E785 Hyperlipidemia, unspecified: Secondary | ICD-10-CM

## 2018-12-05 DIAGNOSIS — Z79899 Other long term (current) drug therapy: Secondary | ICD-10-CM

## 2018-12-05 DIAGNOSIS — Z8679 Personal history of other diseases of the circulatory system: Secondary | ICD-10-CM

## 2018-12-05 DIAGNOSIS — M170 Bilateral primary osteoarthritis of knee: Secondary | ICD-10-CM | POA: Diagnosis not present

## 2018-12-05 DIAGNOSIS — Z8673 Personal history of transient ischemic attack (TIA), and cerebral infarction without residual deficits: Secondary | ICD-10-CM

## 2018-12-05 NOTE — Patient Instructions (Addendum)
Standing Labs We placed an order today for your standing lab work.    Please come back and get your standing labs in January and every 2 months  We have open lab Monday through Friday from 8:30-11:30 AM and 1:30-4:00 PM  at the office of Dr. Pollyann SavoyShaili Demauri Advincula.   You may experience shorter wait times on Monday and Friday afternoons. The office is located at [address], [address], [place], [place] [address] No appointment is necessary.   Labs are drawn by [facility].  You may receive a bill from [facility] for your lab work. If you have any questions regarding directions or hours of operation,  please call [phone].   Just as a reminder please drink plenty of water prior to coming for your lab work. Thanks!

## 2018-12-27 ENCOUNTER — Other Ambulatory Visit: Payer: Self-pay | Admitting: Rheumatology

## 2018-12-27 NOTE — Telephone Encounter (Signed)
Last Visit: 12/05/18 Next visit: 05/08/19 Labs: 09/23/18 stable Tb Gold: 06/20/18 Neg   Left message to advise patient we needs updated labs.  Okay to refill 30 day supply per Dr. Corliss Skains.

## 2019-01-07 ENCOUNTER — Telehealth: Payer: Self-pay | Admitting: *Deleted

## 2019-01-07 NOTE — Telephone Encounter (Signed)
Fax received from [facility] stating they have made multiple attempted to contact patient and unable to reach her to fill Huimra. Contact number [phone]. Left message to advise patient.

## 2019-01-28 ENCOUNTER — Other Ambulatory Visit: Payer: Self-pay | Admitting: Rheumatology

## 2019-01-28 NOTE — Telephone Encounter (Signed)
Last Visit: 12/05/18 Next visit: 05/08/19 Labs: 11/19/18 Potassium 3.2 Tb gold: 06/20/18 Neg   Okay to refill per Dr. Corliss Skains

## 2019-01-30 NOTE — Progress Notes (Signed)
Cardiology Office Note:    Date:  02/06/2019   ID:  Margaret Hobbs, DOB 06-07-1951, MRN [id number]  PCP:  Martha Clan, MD  Cardiologist:  Dr. Charlton Haws   Electrophysiologist:  n/a  Referring MD: Martha Clan, MD   No chief complaint on file.   History of Present Illness:    Margaret Hobbs is a 68 y.o. female with a hx of CAD s/p inferior STEMI in 1/08 tx with BMS to the RCA, HTN, HL, carotid artery disease, prior TIA, psoriatic arthritis. She denies chest pain, shortness of breath, syncope, orthopnea, PND or significant pedal edema. She is mainly limited by her arthritis.  No chest pain Compliant with meds On statin labs followed By primary Last dupex 2017 with plaque no stenosis in ICA;s  She has some pain in her left calf with walking No chest pain   Prior CV studies that were reviewed today include:    Carotid US 1/17 bilat ICA 1-39  LHC 6/08 LM distal minor stenosis LAD prox 30, mid 60, distal 80; D2 ostial 70 RCA prox 60, mid 50, distal stent patent; post AV segment prox 95 EF 60  Past Medical History:  Diagnosis Date  . Aphasia   . Arthritis   . CAD (coronary artery disease)   . Cerebrovascular disease   . CVA (cerebral vascular accident) (HCC)   . Depressed   . Diabetes mellitus   . DM2 (diabetes mellitus, type 2) (HCC)   . HLD (hyperlipidemia)   . HTN (hypertension)   . Hypercholesteremia   . Hyperparathyroidism   . MI (myocardial infarction) (HCC)   . Osteoporosis   . TIA (transient ischemic attack)   . UTI (urinary tract infection)     Past Surgical History:  Procedure Laterality Date  . ABDOMINAL HYSTERECTOMY    . hysterectomy - unknown type      Current Medications: Current Meds  Medication Sig  . amoxicillin (AMOXIL) 500 MG capsule Take 2,000 mg by mouth as directed. Takes 1 prior before dental procedures  . Ascorbic Acid (VITAMIN C) 500 MG tablet Take 500 mg by mouth daily.    [name] aspirin 81 MG tablet Take 81 mg by mouth  daily.    . cholecalciferol (VITAMIN D) 1000 UNITS tablet Take 2,000 Units by mouth daily.  . clopidogrel (PLAVIX) 75 MG tablet Take 1 tablet (75 mg total) by mouth daily.  [name] co-enzyme Q-10 30 MG capsule Take 30 mg by mouth daily.   . cyanocobalamin 1000 MCG tablet Take 1,000 mcg by mouth daily.   . folic acid (FOLVITE) 1 MG tablet Take 2 tablets (2 mg total) by mouth every morning.  [name] glipiZIDE-metformin (METAGLIP) 5-500 MG per tablet Take 1 tablet by mouth 2 (two) times daily before a meal.    . HUMIRA 40 MG/0.8ML PSKT INJECT 40MG  SUBCUTANEOUSLY  EVERY OTHER WEEK  . hydrALAZINE (APRESOLINE) 50 MG tablet TAKE 1 TABLET BY MOUTH 2  TIMES DAILY.  . hydrochlorothiazide (HYDRODIURIL) 25 MG tablet Take 1 tablet (25 mg total) by mouth daily.  . metoprolol succinate (TOPROL-XL) 100 MG 24 hr tablet Take 1 tablet (100 mg total) by mouth daily.  . nitroGLYCERIN (NITROSTAT) 0.4 MG SL tablet PLACE 1 TABLET UNDER THE TONGUE EVERY 5 (FIVE) MINUTES AS NEEDED FOR CHEST PAIN (3 DOSES MAX)  . POTASSIUM PO Take by mouth daily.  . ramipril (ALTACE) 10 MG capsule Take 1 capsule (10 mg total) by mouth daily.  . simvastatin (ZOCOR) 40 MG tablet Take 1  tablet by mouth daily.  . vitamin A 16967 UNIT capsule Take 10,000 Units by mouth daily.    . vitamin E 400 UNIT capsule Take 400 Units by mouth daily.    . zoledronic acid (RECLAST) 5 MG/100ML SOLN injection Inject 5 mg into the vein once.      Allergies:   Remicade [infliximab] and Sulfa antibiotics   Social History   Socioeconomic History  . Marital status: Widowed    Spouse name: Not on file  . Number of children: 1  . Years of education: Not on file  . Highest education level: Not on file  Occupational History  . Not on file  Social Needs  . Financial resource strain: Not on file  . Food insecurity:    Worry: Not on file    Inability: Not on file  . Transportation needs:    Medical: Not on file    Non-medical: Not on file  Tobacco Use  . Smoking  status: Never Smoker  . Smokeless tobacco: Never Used  Substance and Sexual Activity  . Alcohol use: No  . Drug use: Never  . Sexual activity: Not on file  Lifestyle  . Physical activity:    Days per week: Not on file    Minutes per session: Not on file  . Stress: Not on file  Relationships  . Social connections:    Talks on phone: Not on file    Gets together: Not on file    Attends religious service: Not on file    Active member of club or organization: Not on file    Attends meetings of clubs or organizations: Not on file    Relationship status: Not on file  Other Topics Concern  . Not on file  Social History Narrative  . Not on file     Family History:  The patient's family history includes Alcohol abuse in an other family member; Coronary artery disease in an other family member; Diabetes in her brother and another family member; Heart disease in her mother; Hyperlipidemia in her mother; Hypertension in her mother, son, and another family member.   ROS:   Please see the history of present illness.    Review of Systems  Musculoskeletal: Positive for joint swelling.   All other systems reviewed and are negative.   EKGs/Labs/Other Test Reviewed:    EKG:   02/04/18 SR rate 75 poor R wave progression 02/06/19 SR rate 67 poor R wave progression   Recent Labs: 06/20/2018: ALT 15; BUN 29; Creat 1.17; Hemoglobin 12.6; Platelets 293; Potassium 3.4; Sodium 139   Recent Lipid Panel    Component Value Date/Time   CHOL 138 08/24/2016 0912   TRIG 197 (H) 08/24/2016 0912   HDL 40 (L) 08/24/2016 0912   CHOLHDL 3.5 08/24/2016 0912   VLDL 39 (H) 08/24/2016 0912   LDLCALC 59 08/24/2016 0912     Physical Exam:    VS:  BP 128/74   Pulse 67   Ht 5\' 2"  (1.575 m)   Wt 197 lb 12.8 oz (89.7 kg)   SpO2 96%   BMI 36.18 kg/m     Wt Readings from Last 3 Encounters:  02/06/19 197 lb 12.8 oz (89.7 kg)  12/05/18 196 lb 12.8 oz (89.3 kg)  10/10/18 196 lb (88.9 kg)     Affect  appropriate Obese white female  HEENT: normal Neck supple with no adenopathy JVP normal no bruits no thyromegaly Lungs clear with no wheezing and good diaphragmatic motion  Heart:  S1/S2 no murmur, no rub, gallop or click PMI normal Abdomen: benighn, BS positve, no tenderness, no AAA no bruit.  No HSM or HJR Distal pulses intact with no bruits No edema Neuro non-focal Skin warm and dry No muscular weakness   ASSESSMENT:    1. Coronary artery disease involving native coronary artery of native heart without angina pectoris   2. Essential hypertension   3. HYPERCHOLESTEROLEMIA    PLAN:    In order of problems listed above:  1. CAD - s/p remote MI in 2008 with BMS to the RCA.  She had residual mod non-obstructive CAD at that time treated medically.  She has not had any further angina.   Continue ASA, Plavix, statin, beta-blocker.   2. HTN - Well controlled.  Continue current medications and low sodium Dash type diet.    3. HL - Managed by PCP.   Continue Simvastatin.  4. Carotid artery disease - F/U carotid duplex ordered had plaque in 2017  5. PVD:  Has pain in left calf with ambulation f/u ABI's    Charlton HawsPeter

## 2019-02-06 ENCOUNTER — Ambulatory Visit: Payer: Medicare Other | Admitting: Cardiovascular Disease

## 2019-02-06 ENCOUNTER — Encounter: Payer: Self-pay | Admitting: Cardiovascular Disease

## 2019-02-06 VITALS — BP 128/74 | HR 67 | Ht 62.0 in | Wt 197.8 lb

## 2019-02-06 DIAGNOSIS — E7800 Pure hypercholesterolemia, unspecified: Secondary | ICD-10-CM | POA: Diagnosis not present

## 2019-02-06 DIAGNOSIS — I10 Essential (primary) hypertension: Secondary | ICD-10-CM | POA: Diagnosis not present

## 2019-02-06 DIAGNOSIS — I2510 Atherosclerotic heart disease of native coronary artery without angina pectoris: Secondary | ICD-10-CM

## 2019-02-06 DIAGNOSIS — I739 Peripheral vascular disease, unspecified: Secondary | ICD-10-CM | POA: Diagnosis not present

## 2019-02-06 DIAGNOSIS — I779 Disorder of arteries and arterioles, unspecified: Secondary | ICD-10-CM

## 2019-02-06 NOTE — Patient Instructions (Addendum)
Medication Instructions:   If you need a refill on your cardiac medications before your next appointment, please call your pharmacy.   Lab work:  If you have labs (blood work) drawn today and your tests are completely normal, you will receive your results only by: [name]. MyChart Message (if you have MyChart) OR . A paper copy in the mail If you have any lab test that is abnormal or we need to change your treatment, we will call you to review the results.  Testing/Procedures: Your physician has requested that you have a carotid duplex. This test is an ultrasound of the carotid arteries in your neck. It looks at blood flow through these arteries that supply the brain with blood. Allow one hour for this exam. There are no restrictions or special instructions.  Your physician has requested that you have a lower extremity arterial duplex with ABI's. This test is an ultrasound of the arteries in the legs. It looks at arterial blood flow in the legs. Allow one hour for Lower Arterial scans. There are no restrictions or special instructions  Follow-Up: At [facility], you and your health needs are our priority.  As part of our continuing mission to provide you with exceptional heart care, we have created designated Provider Care Teams.  These Care Teams include your primary Cardiologist (physician) and Advanced Practice Providers (APPs -  Physician Assistants and Nurse Practitioners) who all work together to provide you with the care you need, when you need it. You will need a follow up appointment in 12 months.  Please call our office 2 months in advance to schedule this appointment.  You may see Charlton HawsPeter Nishan, MD or one of the following Advanced Practice Providers on your designated Care Team:   Norma FredricksonLori Gerhardt, NP Nada BoozerLaura Ingold, NP . Georgie ChardJill McDaniel, NP

## 2019-02-17 ENCOUNTER — Other Ambulatory Visit: Payer: Self-pay | Admitting: Cardiovascular Disease

## 2019-02-19 ENCOUNTER — Other Ambulatory Visit (HOSPITAL_COMMUNITY): Payer: Self-pay | Admitting: Cardiovascular Disease

## 2019-02-19 DIAGNOSIS — I739 Peripheral vascular disease, unspecified: Secondary | ICD-10-CM

## 2019-03-03 ENCOUNTER — Ambulatory Visit (HOSPITAL_COMMUNITY)
Admission: RE | Admit: 2019-03-03 | Discharge: 2019-03-03 | Disposition: A | Discharge status: Home / Self Care | Payer: Medicare Other | Source: Ambulatory Visit | Attending: Cardiovascular Disease | Admitting: Cardiovascular Disease

## 2019-03-03 DIAGNOSIS — I739 Peripheral vascular disease, unspecified: Secondary | ICD-10-CM | POA: Diagnosis not present

## 2019-03-03 DIAGNOSIS — E7800 Pure hypercholesterolemia, unspecified: Secondary | ICD-10-CM | POA: Diagnosis not present

## 2019-03-03 DIAGNOSIS — I2510 Atherosclerotic heart disease of native coronary artery without angina pectoris: Secondary | ICD-10-CM

## 2019-03-03 DIAGNOSIS — I779 Disorder of arteries and arterioles, unspecified: Secondary | ICD-10-CM

## 2019-03-05 ENCOUNTER — Telehealth: Payer: Self-pay

## 2019-03-05 DIAGNOSIS — I739 Peripheral vascular disease, unspecified: Secondary | ICD-10-CM

## 2019-03-05 NOTE — Telephone Encounter (Signed)
Patient aware of results. Will put in referral to see Dr. Allyson Sabal or Dr. Kirke Corin. Will send message to [facility] to help schedule.

## 2019-03-05 NOTE — Telephone Encounter (Signed)
-----   Message from Jake Bathe, MD sent at 03/05/2019 12:10 PM EDT ----- Summary: Right: Atherosclerosis in the common femoral, femoral, popliteal and tibial arteries. 50-74% stenosis at the ostium SFA. Three vessel run off.  Left: Atherosclerosis in the common femoral, femoral, popliteal and tibial arteries. 50-74% stenosis at the proximal SFA. Three vessel run off.  Please have her set up with consultation with PV, Dr. Allyson Sabal or Dr. Jola Schmidt, MD, covering for Dr. Eden Emms

## 2019-03-10 ENCOUNTER — Telehealth: Payer: Self-pay

## 2019-03-10 DIAGNOSIS — E7800 Pure hypercholesterolemia, unspecified: Secondary | ICD-10-CM

## 2019-03-10 DIAGNOSIS — I739 Peripheral vascular disease, unspecified: Secondary | ICD-10-CM

## 2019-03-10 DIAGNOSIS — I2510 Atherosclerotic heart disease of native coronary artery without angina pectoris: Secondary | ICD-10-CM

## 2019-03-10 NOTE — Telephone Encounter (Signed)
-----   Message from Wendall Stade, MD sent at 03/10/2019  8:30 AM EDT ----- Plaque no stenosis f/u carotid duplex in 2 years

## 2019-03-10 NOTE — Telephone Encounter (Signed)
Pt given results of Carotid and reordered for 2 year follow up.

## 2019-03-20 ENCOUNTER — Telehealth: Payer: Self-pay | Admitting: *Deleted

## 2019-03-20 NOTE — Telephone Encounter (Signed)
   Cardiac Questionnaire:    Since your last visit or hospitalization:    1. Have you been having new or worsening chest pain? no   2. Have you been having new or worsening shortness of breath? no 3. Have you been having new or worsening leg swelling, wt gain, or increase in abdominal girth (pants fitting more tightly)? no   4. Have you had any passing out spells? no    *A YES to any of these questions would result in the appointment being kept. *If all the answers to these questions are NO, we should indicate that given the current situation regarding the worldwide coronarvirus pandemic, at the recommendation of the CDC, we are looking to limit gatherings in our waiting area, and thus will reschedule their appointment beyond four weeks from today.   _____________     Primary Cardiologist:  Charlton Haws, MD   Patient contacted.  History reviewed.  No symptoms to suggest any unstable cardiac conditions.  Based on discussion, with current pandemic situation, we will be postponing this appointment for Margaret Hobbs with a plan for f/u in 3 months or sooner if feasible/necessary.  If symptoms change, she has been instructed to contact our office.   Follow up scheduled   Deliah Goody, RN  03/20/2019 2:04 PM         .

## 2019-03-21 ENCOUNTER — Ambulatory Visit: Payer: Medicare Other | Admitting: Cardiovascular Disease

## 2019-03-23 ENCOUNTER — Other Ambulatory Visit: Payer: Self-pay | Admitting: Cardiovascular Disease

## 2019-03-28 ENCOUNTER — Other Ambulatory Visit: Payer: Self-pay | Admitting: Rheumatology

## 2019-03-28 NOTE — Telephone Encounter (Signed)
Last Visit: 12/05/18 Next visit: 05/08/19 Labs: 01/14/19 RDW 11.7, MPV 11.3 BUN 25 Tb gold: 06/20/18 Neg   Okay to refill per Dr. Corliss Skains

## 2019-05-05 NOTE — Progress Notes (Signed)
Office Visit Note  Patient: Margaret Hobbs             Date of Birth: [birth year]           MRN: [id number]             PCP: Martha Clan, MD Referring: Martha Clan, MD Visit Date: 05/08/2019 Occupation: @GUAROCC @  Subjective:  Arthritis (Right hand pain)   History of Present Illness: Margaret Hobbs is a 68 y.o. female with history of psoriatic arthritis psoriasis and osteoarthritis.  She states she has been having discomfort in her right middle and ring finger as it locks intermittently.  She believes her right hand MCP joints swell intermittently as well.  She has been having discomfort in her left lower extremity.  She states she is having work-up for most likely peripheral vascular disease.  She has 1 psoriasis patch behind her left ear.  She has been taking Humira on regular basis.  Activities of Daily Living:  Patient reports morning stiffness for 0 none.   Patient Denies nocturnal pain.  Difficulty dressing/grooming: Denies Difficulty climbing stairs: Reports Difficulty getting out of chair: Denies Difficulty using hands for taps, buttons, cutlery, and/or writing: Reports  Review of Systems  Constitutional: Negative for fatigue, night sweats, weight gain and weight loss.  HENT: Negative for mouth sores, trouble swallowing, trouble swallowing, mouth dryness and nose dryness.   Eyes: Negative for pain, redness, visual disturbance and dryness.  Respiratory: Negative for cough, shortness of breath and difficulty breathing.   Cardiovascular: Negative for chest pain, palpitations, hypertension, irregular heartbeat and swelling in legs/feet.  Gastrointestinal: Positive for constipation and diarrhea. Negative for blood in stool.  Endocrine: Positive for cold intolerance. Negative for increased urination.  Genitourinary: Negative for difficulty urinating and vaginal dryness.  Musculoskeletal: Positive for arthralgias, joint pain and joint swelling. Negative for  myalgias, muscle weakness, morning stiffness, muscle tenderness and myalgias.  Skin: Positive for rash. Negative for color change, hair loss, skin tightness, ulcers and sensitivity to sunlight.       Psoriasis patch behind left ear  Allergic/Immunologic: Negative for susceptible to infections.  Neurological: Negative for dizziness, memory loss, night sweats and weakness.  Hematological: Negative for bruising/bleeding tendency and swollen glands.  Psychiatric/Behavioral: Positive for sleep disturbance. Negative for depressed mood. The patient is not nervous/anxious.     PMFS History:  Patient Active Problem List   Diagnosis Date Noted  . Carotid artery disease (HCC) 01/24/2017  . Primary osteoarthritis of both knees 11/29/2016  . Other psoriasis 11/28/2016  . Psoriatic arthritis (HCC) 11/28/2016  . High risk medication use 11/28/2016  . Type 2 diabetes mellitus (HCC) 03/30/2009  . HYPERCHOLESTEROLEMIA 03/30/2009  . Dyslipidemia 03/30/2009  . Essential hypertension 03/30/2009  . MYOCARDIAL INFARCTION 03/30/2009  . Coronary artery disease involving native coronary artery of native heart without angina pectoris 03/30/2009  . Cerebral artery occlusion with cerebral infarction (HCC) 03/30/2009  . URINARY TRACT INFECTION 03/30/2009  . ARTHRITIS 03/30/2009  . HYPERPARATHYROIDISM, HX OF 03/30/2009    Past Medical History:  Diagnosis Date  . Aphasia   . Arthritis   . CAD (coronary artery disease)   . Cerebrovascular disease   . CVA (cerebral vascular accident) (HCC)   . Depressed   . Diabetes mellitus   . DM2 (diabetes mellitus, type 2) (HCC)   . HLD (hyperlipidemia)   . HTN (hypertension)   . Hypercholesteremia   . Hyperparathyroidism   . MI (myocardial infarction) (HCC)   . Osteoporosis   .  TIA (transient ischemic attack)   . UTI (urinary tract infection)     Family History  Problem Relation Age of Onset  . Heart disease Mother        pacemaker  . Hypertension Mother   .  Hyperlipidemia Mother   . Diabetes Brother        pre-diabetic   . Hypertension Son   . Diabetes Other   . Hypertension Other   . Alcohol abuse Other   . Coronary artery disease Other    Past Surgical History:  Procedure Laterality Date  . ABDOMINAL HYSTERECTOMY    . hysterectomy - unknown type     Social History   Social History Narrative  . Not on file    There is no immunization history on file for this patient.   Objective: Vital Signs: BP (!) 118/56 (BP Location: Left Arm, Patient Position: Sitting, Cuff Size: Normal)   Pulse 66   Resp 18   Ht 5' 1.75" (1.568 m)   Wt 201 lb 9.6 oz (91.4 kg)   BMI 37.17 kg/m    Physical Exam Vitals signs and nursing note reviewed.  Constitutional:      Appearance: She is well-developed.  HENT:     Head: Normocephalic and atraumatic.  Eyes:     Conjunctiva/sclera: Conjunctivae normal.  Neck:     Musculoskeletal: Normal range of motion.  Cardiovascular:     Rate and Rhythm: Normal rate and regular rhythm.     Heart sounds: Normal heart sounds.  Pulmonary:     Effort: Pulmonary effort is normal.     Breath sounds: Normal breath sounds.  Abdominal:     General: Bowel sounds are normal.     Palpations: Abdomen is soft.  Lymphadenopathy:     Cervical: No cervical adenopathy.  Skin:    General: Skin is warm and dry.     Capillary Refill: Capillary refill takes less than 2 seconds.     Comments: Psoriasis patch behind left ear  Neurological:     Mental Status: She is alert and oriented to person, place, and time.     Comments: Left lower extremity weakness  Psychiatric:        Behavior: Behavior normal.      Musculoskeletal Exam: C-spine thoracic and lumbar spine good range of motion.  Shoulder joints elbow joints wrist joints with good range of motion.  She has tenderness over her MCP joints but no synovitis was noted.  No PIP and DIP thickening or swelling was noted.  Hip joints and knee joints were in good range of  motion.  She has difficulty walking due to left calf muscle pain which is chronic.  CDAI Exam: CDAI Score: 2.7  Patient Global Assessment: 5 (mm); Provider Global Assessment: 2 (mm) Swollen: 0 ; Tender: 2  Joint Exam      Right  Left  MCP 2   Tender     MCP 3      Tender     Investigation: No additional findings.  Imaging: Xr Hand 2 View Left  Result Date: 05/08/2019 Juxta-articular osteopenia was noted.  PIP and DIP narrowing was noted.  Narrowing of third fourth and fifth MCP joints was also noted.  No significant intercarpal radiocarpal joint space narrowing was noted.  Possible cystic versus erosive change was noted in the lunate bone. Impression: These findings are consistent with osteoarthritis and psoriatic arthritis overlap.  Xr Hand 2 View Right  Result Date: 05/08/2019 Juxta-articular osteopenia was noted.  MCP PIP and DIP narrowing was noted.  Intercarpal and radiocarpal joint space narrowing was noted.  No erosive changes were noted. Impression: These findings are consistent with psoriatic arthritis and osteoarthritis overlap.   Recent Labs: Lab Results  Component Value Date   WBC 6.6 06/20/2018   HGB 12.6 06/20/2018   PLT 293 06/20/2018   NA 139 06/20/2018   K 3.4 (L) 06/20/2018   CL 99 06/20/2018   CO2 30 06/20/2018   GLUCOSE 143 (H) 06/20/2018   BUN 29 (H) 06/20/2018   CREATININE 1.17 (H) 06/20/2018   BILITOT 0.5 06/20/2018   ALKPHOS 64 08/01/2016   AST 17 06/20/2018   ALT 15 06/20/2018   PROT 7.6 06/20/2018   ALBUMIN 3.1 (L) 08/01/2016   CALCIUM 10.2 06/20/2018   GFRAA 57 (L) 06/20/2018   QFTBGOLDPLUS NEGATIVE 06/20/2018    Speciality Comments: No specialty comments available.  Procedures:  No procedures performed Allergies: Remicade [infliximab] and Sulfa antibiotics   Assessment / Plan:     Visit Diagnoses: Psoriatic arthritis (HCC)-patient complains of intermittent swelling and discomfort over her MCP joints.  I do not see any synovitis on  examination.  Other psoriasis-patient has 1 small patch  behind her left ear.  High risk medication use - Humira 40 mg every 14 days.  I do not see any labs since 12/2018.  She will need labs today for refill on Humira.  TB gold was done in June 2019.  We will refill Humira today.  Pain in both hands - Plan: XR Hand 2 View Right, XR Hand 2 View Left.  The x-rays were consistent with osteoarthritis and psoriatic arthritis overlap.  No radiographic progression was noted.  Primary osteoarthritis of both knees-she has chronic discomfort.  Other medical problems are listed as follows:  History of coronary artery disease  History of hypercholesterolemia  History of hypertension  Dyslipidemia  History of diabetes mellitus  History of stroke -she has left lower extremity weakness.  Orders: Orders Placed This Encounter  Procedures  . XR Hand 2 View Right  . XR Hand 2 View Left   No orders of the defined types were placed in this encounter.   Face-to-face time spent with patient was 30 minutes. Greater than 50% of time was spent in counseling and coordination of care.  Follow-Up Instructions: Return in about 5 months (around 10/08/2019) for Psoriatic arthritis, Osteoarthritis.   Pollyann SavoyShaili Lauryn Lizardi, MD  Note - This record has been created using Animal nutritionistDragon software.  Chart creation errors have been sought, but may not always  have been located. Such creation errors do not reflect on  the standard of medical care.

## 2019-05-07 ENCOUNTER — Other Ambulatory Visit: Payer: Self-pay | Admitting: Rheumatology

## 2019-05-08 ENCOUNTER — Other Ambulatory Visit: Payer: Self-pay

## 2019-05-08 ENCOUNTER — Ambulatory Visit (INDEPENDENT_AMBULATORY_CARE_PROVIDER_SITE_OTHER): Payer: Medicare Other

## 2019-05-08 ENCOUNTER — Encounter: Payer: Self-pay | Admitting: Rheumatology

## 2019-05-08 ENCOUNTER — Ambulatory Visit: Payer: Medicare Other | Admitting: Rheumatology

## 2019-05-08 ENCOUNTER — Ambulatory Visit: Payer: Self-pay

## 2019-05-08 VITALS — BP 118/56 | HR 66 | Resp 18 | Ht 61.75 in | Wt 201.6 lb

## 2019-05-08 DIAGNOSIS — M79642 Pain in left hand: Secondary | ICD-10-CM | POA: Diagnosis not present

## 2019-05-08 DIAGNOSIS — Z79899 Other long term (current) drug therapy: Secondary | ICD-10-CM | POA: Diagnosis not present

## 2019-05-08 DIAGNOSIS — L4050 Arthropathic psoriasis, unspecified: Secondary | ICD-10-CM

## 2019-05-08 DIAGNOSIS — M79641 Pain in right hand: Secondary | ICD-10-CM | POA: Diagnosis not present

## 2019-05-08 DIAGNOSIS — Z8673 Personal history of transient ischemic attack (TIA), and cerebral infarction without residual deficits: Secondary | ICD-10-CM

## 2019-05-08 DIAGNOSIS — E785 Hyperlipidemia, unspecified: Secondary | ICD-10-CM

## 2019-05-08 DIAGNOSIS — L408 Other psoriasis: Secondary | ICD-10-CM

## 2019-05-08 DIAGNOSIS — M170 Bilateral primary osteoarthritis of knee: Secondary | ICD-10-CM

## 2019-05-08 DIAGNOSIS — Z8679 Personal history of other diseases of the circulatory system: Secondary | ICD-10-CM

## 2019-05-08 DIAGNOSIS — Z8639 Personal history of other endocrine, nutritional and metabolic disease: Secondary | ICD-10-CM

## 2019-05-08 NOTE — Patient Instructions (Addendum)
Standing Labs We placed an order today for your standing lab work.    Please come back and get your standing labs in June and every 3 months   We have open lab Monday through Friday from 8:30-11:30 AM and 1:30-4:00 PM  at the office of Dr. Brittnee Gaetano.   You may experience shorter wait times on Monday and Friday afternoons. The office is located at [address], [address], [place], [place] [address] No appointment is necessary.   Labs are drawn by [facility].  You may receive a bill from [facility] for your lab work.  If you wish to have your labs drawn at another location, please call the office 24 hours in advance to send orders.  If you have any questions regarding directions or hours of operation,  please call [phone].   Just as a reminder please drink plenty of water prior to coming for your lab work. Thanks!   

## 2019-05-08 NOTE — Telephone Encounter (Signed)
Last Visit: 12/05/18 Next visit: 05/08/19 Labs:01/14/19 RDW 11.7, MPV 11.3 BUN 25 Tb gold: 06/20/18 Neg   Left message to advise patient she is due to update labs.   Okay to refill 30 day supply per Dr. Corliss Skains

## 2019-05-23 ENCOUNTER — Telehealth: Payer: Self-pay | Admitting: *Deleted

## 2019-05-23 NOTE — Telephone Encounter (Signed)
Called patient and she wanted to reschedule her appointment to 09/03/2019 so that she could see Dr. Allyson Sabal in the office.

## 2019-05-28 ENCOUNTER — Ambulatory Visit: Payer: Medicare Other | Admitting: Cardiovascular Disease

## 2019-07-01 ENCOUNTER — Other Ambulatory Visit: Payer: Self-pay | Admitting: *Deleted

## 2019-07-01 DIAGNOSIS — Z79899 Other long term (current) drug therapy: Secondary | ICD-10-CM

## 2019-07-01 DIAGNOSIS — Z9225 Personal history of immunosupression therapy: Secondary | ICD-10-CM

## 2019-07-02 NOTE — Progress Notes (Signed)
stable °

## 2019-07-03 LAB — COMPLETE METABOLIC PANEL WITH GFR
AG Ratio: 1.1 (calc) (ref 1.0–2.5)
ALT: 19 U/L (ref 6–29)
AST: 22 U/L (ref 10–35)
Albumin: 3.9 g/dL (ref 3.6–5.1)
Alkaline phosphatase (APISO): 36 U/L — ABNORMAL LOW (ref 37–153)
BUN/Creatinine Ratio: 27 (calc) — ABNORMAL HIGH (ref 6–22)
BUN: 27 mg/dL — ABNORMAL HIGH (ref 7–25)
CO2: 30 mmol/L (ref 20–32)
Calcium: 9.7 mg/dL (ref 8.6–10.4)
Chloride: 101 mmol/L (ref 98–110)
Creat: 0.99 mg/dL (ref 0.50–0.99)
GFR, Est African American: 69 mL/min/{1.73_m2} (ref >=60)
GFR, Est Non African American: 59 mL/min/{1.73_m2} — ABNORMAL LOW (ref >=60)
Globulin: 3.4 g/dL (calc) (ref 1.9–3.7)
Glucose, Bld: 129 mg/dL — ABNORMAL HIGH (ref 65–99)
Potassium: 3.9 mmol/L (ref 3.5–5.3)
Sodium: 139 mmol/L (ref 135–146)
Total Bilirubin: 0.4 mg/dL (ref 0.2–1.2)
Total Protein: 7.3 g/dL (ref 6.1–8.1)

## 2019-07-03 LAB — CBC WITH DIFFERENTIAL/PLATELET
Absolute Monocytes: 582 cells/uL (ref 200–950)
Basophils Absolute: 64 cells/uL (ref 0–200)
Basophils Relative: 0.9 %
Eosinophils Absolute: 433 cells/uL (ref 15–500)
Eosinophils Relative: 6.1 %
HCT: 39.7 % (ref 35.0–45.0)
Hemoglobin: 12.8 g/dL (ref 11.7–15.5)
Lymphs Abs: 1463 cells/uL (ref 850–3900)
MCH: 29.8 pg (ref 27.0–33.0)
MCHC: 32.2 g/dL (ref 32.0–36.0)
MCV: 92.3 fL (ref 80.0–100.0)
MPV: 14.2 fL — ABNORMAL HIGH (ref 7.5–12.5)
Monocytes Relative: 8.2 %
Neutro Abs: 4558 cells/uL (ref 1500–7800)
Neutrophils Relative %: 64.2 %
Platelets: 250 10*3/uL (ref 140–400)
RBC: 4.3 10*6/uL (ref 3.80–5.10)
RDW: 12.3 % (ref 11.0–15.0)
Total Lymphocyte: 20.6 %
WBC: 7.1 10*3/uL (ref 3.8–10.8)

## 2019-07-03 LAB — QUANTIFERON-TB GOLD PLUS
Mitogen-NIL: >10 IU/mL
NIL: 0.01 IU/mL
QuantiFERON-TB Gold Plus: NEGATIVE
TB1-NIL: 0 IU/mL
TB2-NIL: 0 IU/mL

## 2019-08-06 ENCOUNTER — Other Ambulatory Visit: Payer: Self-pay | Admitting: Rheumatology

## 2019-08-06 NOTE — Telephone Encounter (Signed)
Last Visit: 05/08/19 Next Visit: 10/09/19 Labs: 07/01/19 stable TB Gold: 07/01/19 Neg   Okay to refill per Dr. Estanislado Pandy

## 2019-08-10 ENCOUNTER — Other Ambulatory Visit: Payer: Self-pay | Admitting: Rheumatology

## 2019-08-13 ENCOUNTER — Other Ambulatory Visit: Payer: Self-pay | Admitting: Cardiovascular Disease

## 2019-09-03 ENCOUNTER — Ambulatory Visit: Payer: Medicare Other | Admitting: Cardiovascular Disease

## 2019-09-25 NOTE — Progress Notes (Signed)
Office Visit Note  Patient: Margaret Hobbs             Date of Birth: 09/05/1951           MRN: [id number]             PCP: Marton Redwood, MD Referring: Marton Redwood, MD Visit Date: 10/09/2019 Occupation: @GUAROCC @  Subjective:  Medication monitoring    History of Present Illness: Margaret Hobbs is a 67 y.o. female with hsitory of psoriatic arthritis and osteoarthritis.  She is on Humira 40 mg sq injections every 14 days.  She denies any recent psoriatic arthritis flares.  She is occasional arthralgias but denies any joint swelling or morning stiffness.  She denies any Achilles tendinitis or plantar fasciitis.  She denies any SI joint pain. She has chronic patches of psoriasis in the left ear canal and behind the left ear.    Activities of Daily Living:  Patient reports morning stiffness for 0 minutes.   Patient Denies nocturnal pain.  Difficulty dressing/grooming: Denies Difficulty climbing stairs: Reports Difficulty getting out of chair: Denies Difficulty using hands for taps, buttons, cutlery, and/or writing: Reports  Review of Systems  Constitutional: Positive for fatigue.  HENT: Negative for mouth sores, mouth dryness and nose dryness.   Eyes: Negative for pain, visual disturbance and dryness.  Respiratory: Positive for cough (SE of ACEi). Negative for hemoptysis, shortness of breath and difficulty breathing.   Cardiovascular: Negative for chest pain, palpitations, hypertension and swelling in legs/feet.  Gastrointestinal: Negative for blood in stool, constipation and diarrhea.  Endocrine: Negative for increased urination.  Genitourinary: Negative for painful urination.  Musculoskeletal: Positive for arthralgias and joint pain. Negative for joint swelling, myalgias, muscle weakness, morning stiffness, muscle tenderness and myalgias.  Skin: Positive for rash (Psoriasis). Negative for color change, pallor, hair loss, nodules/bumps, skin tightness, ulcers and  sensitivity to sunlight.  Allergic/Immunologic: Negative for susceptible to infections.  Neurological: Negative for dizziness, numbness, headaches and weakness.  Hematological: Negative for swollen glands.  Psychiatric/Behavioral: Negative for depressed mood and sleep disturbance. The patient is not nervous/anxious.     PMFS History:  Patient Active Problem List   Diagnosis Date Noted  . Carotid artery disease ([place]) 01/24/2017  . Primary osteoarthritis of both knees 11/29/2016  . Other psoriasis 11/28/2016  . Psoriatic arthritis ([place]) 11/28/2016  . High risk medication use 11/28/2016  . Type 2 diabetes mellitus ([place]) 03/30/2009  . HYPERCHOLESTEROLEMIA 03/30/2009  . Dyslipidemia 03/30/2009  . Essential hypertension 03/30/2009  . MYOCARDIAL INFARCTION 03/30/2009  . Coronary artery disease involving native coronary artery of native heart without angina pectoris 03/30/2009  . Cerebral artery occlusion with cerebral infarction ([place]) 03/30/2009  . URINARY TRACT INFECTION 03/30/2009  . ARTHRITIS 03/30/2009  . HYPERPARATHYROIDISM, HX OF 03/30/2009    Past Medical History:  Diagnosis Date  . Aphasia   . Arthritis   . CAD (coronary artery disease)   . Cerebrovascular disease   . CVA (cerebral vascular accident) ([place])   . Depressed   . Diabetes mellitus   . DM2 (diabetes mellitus, type 2) ([place])   . HLD (hyperlipidemia)   . HTN (hypertension)   . Hypercholesteremia   . Hyperparathyroidism   . MI (myocardial infarction) ([place])   . Osteoporosis   . TIA (transient ischemic attack)   . UTI (urinary tract infection)     Family History  Problem Relation Age of Onset  . Heart disease Mother        pacemaker  . Hypertension  Mother   . Hyperlipidemia Mother   . Diabetes Brother        pre-diabetic   . Hypertension Son   . Diabetes Other   . Hypertension Other   . Alcohol abuse Other   . Coronary artery disease Other    Past Surgical History:  Procedure Laterality Date  .  ABDOMINAL HYSTERECTOMY    . hysterectomy - unknown type     Social History   Social History Narrative  . Not on file    There is no immunization history on file for this patient.   Objective: Vital Signs: BP (!) 110/55 (BP Location: Left Arm, Patient Position: Sitting, Cuff Size: Normal)   Pulse 68   Resp 18   Ht 5\' 2"  (1.575 m)   BMI 36.87 kg/m    Physical Exam Vitals signs and nursing note reviewed.  Constitutional:      Appearance: She is well-developed.  HENT:     Head: Normocephalic and atraumatic.  Eyes:     Conjunctiva/sclera: Conjunctivae normal.  Neck:     Musculoskeletal: Normal range of motion.  Cardiovascular:     Rate and Rhythm: Normal rate and regular rhythm.     Heart sounds: Normal heart sounds.  Pulmonary:     Effort: Pulmonary effort is normal.     Breath sounds: Normal breath sounds.  Abdominal:     General: Bowel sounds are normal.     Palpations: Abdomen is soft.  Lymphadenopathy:     Cervical: No cervical adenopathy.  Skin:    General: Skin is warm and dry.     Capillary Refill: Capillary refill takes less than 2 seconds.     Comments: Psoriasis patch behind left ear and in ear canal  Neurological:     Mental Status: She is alert and oriented to person, place, and time.  Psychiatric:        Behavior: Behavior normal.      Musculoskeletal Exam: C-spine good ROM.  Mild thoracic kyphosis noted.  Shoulder joints, elbow joints, wrist joints, MCPs, PIPs, and DIPs good ROM with no synovitis.  Complete fist formation bilaterally. Left 3rd MCP joint synovial thickening. Hip joints, knee joints, ankle joints, MTPs, PIPs, and DIPs good ROM with no synovitis.  No warmth or effusion of knee joints.  No tenderness or swelling of ankle joints.   CDAI Exam: CDAI Score: - Patient Global: -; Provider Global: - Swollen: -; Tender: - Joint Exam   No joint exam has been documented for this visit   There is currently no information documented on the  homunculus. Go to the Rheumatology activity and complete the homunculus joint exam.  Investigation: No additional findings.  Imaging: No results found.  Recent Labs: Lab Results  Component Value Date   WBC 7.1 07/01/2019   HGB 12.8 07/01/2019   PLT 250 07/01/2019   NA 139 07/01/2019   K 3.9 07/01/2019   CL 101 07/01/2019   CO2 30 07/01/2019   GLUCOSE 129 (H) 07/01/2019   BUN 27 (H) 07/01/2019   CREATININE 0.99 07/01/2019   BILITOT 0.4 07/01/2019   ALKPHOS 64 08/01/2016   AST 22 07/01/2019   ALT 19 07/01/2019   PROT 7.3 07/01/2019   ALBUMIN 3.1 (L) 08/01/2016   CALCIUM 9.7 07/01/2019   GFRAA 69 07/01/2019   QFTBGOLDPLUS NEGATIVE 07/01/2019    Speciality Comments: No specialty comments available.  Procedures:  No procedures performed Allergies: Remicade [infliximab] and Sulfa antibiotics  Assessment / Plan:     Visit Diagnoses: Psoriatic arthritis (HCC) -She has no synovitis or dactylitis on exam.  She is clinically doing well on Humira 40 mg subcu injections every 14 days.  She has no joint pain or joint swelling at this time.  She does not experience any morning stiffness.  She experiences arthralgias on occasion.  She has no SI joint tenderness on exam.  No Achilles tendinitis or plantar fasciitis.  She has a patch of psoriasis behind her left ear and in her left ear canal.  She will continue injecting Humira as prescribed.  A refill was sent to the pharmacy today.  She was advised to notify us if she develops increased joint pain or joint swelling.  She will follow-up in the office in 5 months.  Plan: Adalimumab (HUMIRA) 40 MG/0.4ML PSKT  Other psoriasis: She has 1 patch of psoriasis behind the left ear and in the left ear canal.  High risk medication use - Humira 40 mg every 14 days. Last TB gold negative on 07/01/2019 and will monitor yearly.  Most recent CBC/CMP within normal limits on 07/01/2019.  Due for CBC/CMP today and will monitor every 3 months.   Primary osteoarthritis of both knees: She has good range of motion with no discomfort.  No warmth or effusion was noted.  She has difficulty climbing steps but does not have any difficulty getting up from a chair.  Age-related osteoporosis without current pathological fracture: She is on Reclast IV infusion yearly prescribed by Dr. Clelia CroftShaw.  She received her fifth infusion on 10/10/2018.  No DEXA on file.  She is due to update DEXA next week.  Other medical conditions are listed as follows:  History of coronary artery disease  History of hypertension  History of stroke  History of hypercholesterolemia  History of diabetes mellitus  Dyslipidemia  Orders: No orders of the defined types were placed in this encounter.  Meds ordered this encounter  Medications  . Adalimumab (HUMIRA) 40 MG/0.4ML PSKT    Sig: Inject 40 mg into the skin once a week.    Dispense:  3 each    Refill:  0    3 kits- 6 pens     Follow-Up Instructions: Return in about 5 months (around 03/08/2020) for Psoriatic arthritis, Osteoarthritis.   Gearldine Bienenstockaylor M , PA-C   I examined and evaluated the patient with Sherron Alesaylor  PA.  Patient psoriasis and psoriatic arthritis is well controlled.  She had no synovitis on examination today.  The plan of care was discussed as noted above.  Pollyann SavoyShaili Deveshwar, MD  Note - This record has been created using Animal nutritionistDragon software.  Chart creation errors have been sought, but may not always  have been located. Such creation errors do not reflect on  the standard of medical care.

## 2019-10-04 ENCOUNTER — Other Ambulatory Visit: Payer: Self-pay | Admitting: Rheumatology

## 2019-10-08 ENCOUNTER — Other Ambulatory Visit: Payer: Self-pay | Admitting: Rheumatology

## 2019-10-09 ENCOUNTER — Other Ambulatory Visit: Payer: Self-pay

## 2019-10-09 ENCOUNTER — Encounter: Payer: Self-pay | Admitting: Rheumatology

## 2019-10-09 ENCOUNTER — Ambulatory Visit (INDEPENDENT_AMBULATORY_CARE_PROVIDER_SITE_OTHER): Payer: Medicare Other | Admitting: Rheumatology

## 2019-10-09 VITALS — BP 110/55 | HR 68 | Resp 18 | Ht 62.0 in | Wt 202.2 lb

## 2019-10-09 DIAGNOSIS — M810 Age-related osteoporosis without current pathological fracture: Secondary | ICD-10-CM

## 2019-10-09 DIAGNOSIS — L4050 Arthropathic psoriasis, unspecified: Secondary | ICD-10-CM

## 2019-10-09 DIAGNOSIS — Z79899 Other long term (current) drug therapy: Secondary | ICD-10-CM | POA: Diagnosis not present

## 2019-10-09 DIAGNOSIS — L408 Other psoriasis: Secondary | ICD-10-CM | POA: Diagnosis not present

## 2019-10-09 DIAGNOSIS — E785 Hyperlipidemia, unspecified: Secondary | ICD-10-CM

## 2019-10-09 DIAGNOSIS — M170 Bilateral primary osteoarthritis of knee: Secondary | ICD-10-CM | POA: Diagnosis not present

## 2019-10-09 DIAGNOSIS — Z8679 Personal history of other diseases of the circulatory system: Secondary | ICD-10-CM

## 2019-10-09 DIAGNOSIS — Z8639 Personal history of other endocrine, nutritional and metabolic disease: Secondary | ICD-10-CM

## 2019-10-09 DIAGNOSIS — Z8673 Personal history of transient ischemic attack (TIA), and cerebral infarction without residual deficits: Secondary | ICD-10-CM

## 2019-10-09 MED ORDER — HUMIRA (2 SYRINGE) 40 MG/0.4ML ~~LOC~~ PSKT: 40.0000 mg | PREFILLED_SYRINGE | SUBCUTANEOUS | 0 refills | Status: DC

## 2019-10-10 ENCOUNTER — Telehealth: Payer: Self-pay | Admitting: *Deleted

## 2019-10-10 NOTE — Telephone Encounter (Signed)
Received a fax from [facility] for prescription clarification. Prescription for Humira was sent to [facility] with directions to injection SQ weekly, quantity of 3 kits=6 pens. Patient is taking Humira every 14 days.  Spoke with Janett Billow and advised that the patient is still taking Humira every other week.

## 2019-10-15 ENCOUNTER — Ambulatory Visit: Payer: Medicare Other | Admitting: Cardiovascular Disease

## 2019-10-16 ENCOUNTER — Other Ambulatory Visit: Payer: Self-pay | Admitting: Rheumatology

## 2019-10-17 ENCOUNTER — Ambulatory Visit: Payer: Medicare Other | Admitting: Cardiovascular Disease

## 2019-10-17 ENCOUNTER — Other Ambulatory Visit: Payer: Self-pay

## 2019-10-17 ENCOUNTER — Encounter: Payer: Self-pay | Admitting: Cardiovascular Disease

## 2019-10-17 VITALS — BP 116/60 | HR 74 | Temp 97.0°F | Ht 62.0 in | Wt 201.4 lb

## 2019-10-17 DIAGNOSIS — I739 Peripheral vascular disease, unspecified: Secondary | ICD-10-CM

## 2019-10-17 DIAGNOSIS — Z008 Encounter for other general examination: Secondary | ICD-10-CM

## 2019-10-17 MED ORDER — CILOSTAZOL 50 MG PO TABS: 50.0000 mg | ORAL_TABLET | Freq: Two times a day (BID) | ORAL | 3 refills | Status: DC

## 2019-10-17 NOTE — Telephone Encounter (Signed)
Last Visit: 10/09/19 Next Visit: 03/11/20  Okay to refill per Dr. Estanislado Pandy

## 2019-10-17 NOTE — Progress Notes (Signed)
10/17/2019 Margaret Hobbs   December 02, 1951  [id number]  Primary Physician Margaret ClanShaw, William, Hobbs Primary Cardiologist: Margaret Hobbs Margaret Hobbs  HPI:  Margaret Hobbs is a 68 y.o. moderately overweight widowed Caucasian female mother of 1, grandmother of 4 grandchildren referred to me by Margaret Hobbs for peripheral vascular valuation because of symptomatic left calf claudication.  She has a history of remote stroke back in June 2008 and remote inferior wall myocardial infarction January of the way treated with RCA bare-metal stenting.  She has treated hypertension, diabetes and hyperlipidemia.  She was complaining of left calf claudication when Margaret Hobbs saw her back in February.  Lower extremity arterial Doppler studies performed 03/03/2019 revealed a right ABI of 1 and a left ABI 0.7.  She did have a high-frequency signal in her proximal right SFA and tibial vessel disease on the left.   Current Meds  Medication Sig  . Adalimumab (HUMIRA) 40 MG/0.4ML PSKT Inject 40 mg into the skin once a week.  [name]. amoxicillin (AMOXIL) 500 MG capsule Take 2,000 mg by mouth as directed. Takes 1 prior before dental procedures  . Ascorbic Acid (VITAMIN C) 500 MG tablet Take 500 mg by mouth daily.    [name]. aspirin 81 MG tablet Take 81 mg by mouth daily.    . cholecalciferol (VITAMIN D) 1000 UNITS tablet Take 2,000 Units by mouth daily.  . clopidogrel (PLAVIX) 75 MG tablet TAKE 1 TABLET BY MOUTH  DAILY  . co-enzyme Q-10 30 MG capsule Take 30 mg by mouth daily.   . cyanocobalamin 1000 MCG tablet Take 1,000 mcg by mouth daily.   . folic acid (FOLVITE) 1 MG tablet TAKE 2 TABLETS (2MG ) BY  MOUTH EVERY MORNING  . glipiZIDE-metformin (METAGLIP) 5-500 MG per tablet Take 1 tablet by mouth 2 (two) times daily before a meal.    . hydrALAZINE (APRESOLINE) 50 MG tablet TAKE 1 TABLET BY MOUTH TWO  TIMES DAILY  . metoprolol succinate (TOPROL-XL) 100 MG 24 hr tablet TAKE 1 TABLET BY MOUTH  DAILY  .  nitroGLYCERIN (NITROSTAT) 0.4 MG SL tablet PLACE 1 TABLET UNDER THE TONGUE EVERY 5 (FIVE) MINUTES AS NEEDED FOR CHEST PAIN (3 DOSES MAX)  . olmesartan-hydrochlorothiazide (BENICAR HCT) 40-25 MG tablet   . potassium chloride SA (K-DUR) 20 MEQ tablet   . POTASSIUM PO Take by mouth daily.  . simvastatin (ZOCOR) 40 MG tablet Take 1 tablet by mouth daily.  . vitamin A 1914710000 UNIT capsule Take 10,000 Units by mouth daily.    . vitamin E 400 UNIT capsule Take 400 Units by mouth daily.    . zoledronic acid (RECLAST) 5 MG/100ML SOLN injection Inject 5 mg into the vein once.   . [DISCONTINUED] hydrochlorothiazide (HYDRODIURIL) 25 MG tablet TAKE 1 TABLET BY MOUTH  DAILY  . [DISCONTINUED] ramipril (ALTACE) 10 MG capsule TAKE 1 CAPSULE BY MOUTH  DAILY  . [DISCONTINUED] rosuvastatin (CRESTOR) 20 MG tablet      Allergies  Allergen Reactions  . Remicade [Infliximab] Other (See Comments)    Convulsion and felt cold   . Sulfa Antibiotics Rash    Social History   Socioeconomic History  . Marital status: Widowed    Spouse name: Not on file  . Number of children: 1  . Years of education: Not on file  . Highest education level: Not on file  Occupational History  . Not on file  Social Needs  . Financial resource strain: Not on file  .  Food insecurity    Worry: Not on file    Inability: Not on file  . Transportation needs    Medical: Not on file    Non-medical: Not on file  Tobacco Use  . Smoking status: Never Smoker  . Smokeless tobacco: Never Used  Substance and Sexual Activity  . Alcohol use: No  . Drug use: Never  . Sexual activity: Not on file  Lifestyle  . Physical activity    Days per week: Not on file    Minutes per session: Not on file  . Stress: Not on file  Relationships  . Social Musician on phone: Not on file    Gets together: Not on file    Attends religious service: Not on file    Active member of club or organization: Not on file    Attends meetings of clubs  or organizations: Not on file    Relationship status: Not on file  . Intimate partner violence    Fear of current or ex partner: Not on file    Emotionally abused: Not on file    Physically abused: Not on file    Forced sexual activity: Not on file  Other Topics Concern  . Not on file  Social History Narrative  . Not on file     Review of Systems: General: negative for chills, fever, night sweats or weight changes.  Cardiovascular: negative for chest pain, dyspnea on exertion, edema, orthopnea, palpitations, paroxysmal nocturnal dyspnea or shortness of breath Dermatological: negative for rash Respiratory: negative for cough or wheezing Urologic: negative for hematuria Abdominal: negative for nausea, vomiting, diarrhea, bright red blood per rectum, melena, or hematemesis Neurologic: negative for visual changes, syncope, or dizziness All other systems reviewed and are otherwise negative except as noted above.    Blood pressure 116/60, pulse 74, temperature (!) 97 F (36.1 C), height 5\' 2"  (1.575 m), weight 201 lb 6.4 oz (91.4 kg), SpO2 96 %.  General appearance: alert and no distress Neck: no adenopathy, no carotid bruit, no JVD, supple, symmetrical, trachea midline and thyroid not enlarged, symmetric, no tenderness/mass/nodules Lungs: clear to auscultation bilaterally Heart: regular rate and rhythm, S1, S2 normal, no murmur, click, rub or gallop Extremities: extremities normal, atraumatic, no cyanosis or edema Pulses: Absent pedal pulses bilaterally Skin: Skin color, texture, turgor normal. No rashes or lesions Neurologic: Alert and oriented X 3, normal strength and tone. Normal symmetric reflexes. Normal coordination and gait  EKG sinus rhythm at 69 with Q waves in lead III and F.  I personally reviewed this EKG.  ASSESSMENT AND PLAN:   Peripheral arterial disease (HCC) Margaret Hobbs was referred to me by Margaret Hobbs for symptomatic left calf claudication.  She has a  history of CAD status post inferior wall myocardial infarction back in January of LAD treated with PCI and stenting using bare-metal stent of the RCA.  She does have treated hypertension, diabetes and hyperlipidemia.  She noticed left calf chronic patient beginning back in January.  She had Dopplers performed 03/03/2019 that revealed a right ABI of 1 and a left ABI of 0.7.  She did have moderate disease in her proximal right SFA with what appears to be tibial vessel disease on the left.  We talked about therapeutic options at this point but have decided to proceed with Pletal 50 mg p.o. twice daily for pharmacologic intervention initially and if this does not work we will proceed with angiography.  Lorretta Harp Hobbs FACP,FACC,FAHA, [facility] 10/17/2019 11:17 AM

## 2019-10-17 NOTE — Patient Instructions (Addendum)
Medication Instructions:  Your physician has recommended you make the following change in your medication:   START CILOSTAZOL (PLETAL) 50 MG BY MOUTH TWICE A DAY  If you need a refill on your cardiac medications before your next appointment, please call your pharmacy.   Lab work: NONE If you have labs (blood work) drawn today and your tests are completely normal, you will receive your results only by: [name] MyChart Message (if you have MyChart) OR . A paper copy in the mail If you have any lab test that is abnormal or we need to change your treatment, we will call you to review the results.  Testing/Procedures: Your physician has requested that you have a lower or upper extremity arterial duplex. This test is an ultrasound of the arteries in the legs or arms. It looks at arterial blood flow in the legs and arms. Allow one hour for Lower and Upper Arterial scans. There are no restrictions or special instructions TO BE SCHEDULED  Your physician has requested that you have an ankle brachial index (ABI). During this test an ultrasound and blood pressure cuff are used to evaluate the arteries that supply the arms and legs with blood. Allow thirty minutes for this exam. There are no restrictions or special instructions. TO BE SCHEDULED   Follow-Up: At [facility], you and your health needs are our priority.  As part of our continuing mission to provide you with exceptional heart care, we have created designated Provider Care Teams.  These Care Teams include your primary Cardiologist (physician) and Advanced Practice Providers (APPs -  Physician Assistants and Nurse Practitioners) who all work together to provide you with the care you need, when you need it. You will need a follow up appointment in 3 months with Dr. Quay Burow.  Please call our office 2 months in advance to schedule this appointment.

## 2019-10-17 NOTE — Assessment & Plan Note (Signed)
Margaret Hobbs was referred to me by Dr. Johnsie Cancel for symptomatic left calf claudication.  She has a history of CAD status post inferior wall myocardial infarction back in January of LAD treated with PCI and stenting using bare-metal stent of the RCA.  She does have treated hypertension, diabetes and hyperlipidemia.  She noticed left calf chronic patient beginning back in January.  She had Dopplers performed 03/03/2019 that revealed a right ABI of 1 and a left ABI of 0.7.  She did have moderate disease in her proximal right SFA with what appears to be tibial vessel disease on the left.  We talked about therapeutic options at this point but have decided to proceed with Pletal 50 mg p.o. twice daily for pharmacologic intervention initially and if this does not work we will proceed with angiography.

## 2019-10-21 ENCOUNTER — Other Ambulatory Visit: Payer: Self-pay | Admitting: Cardiovascular Disease

## 2019-10-21 MED ORDER — METOPROLOL SUCCINATE ER 100 MG PO TB24: 100.0000 mg | ORAL_TABLET | Freq: Every day | ORAL | 0 refills | Status: DC

## 2019-10-21 NOTE — Telephone Encounter (Signed)
Pt' calling requesting a 15 day supply sent to local pharmacy until mail order arrives. Medication sent as requested. Confirmation received.

## 2019-10-22 ENCOUNTER — Other Ambulatory Visit: Payer: Self-pay | Admitting: Cardiovascular Disease

## 2019-10-22 MED ORDER — METOPROLOL SUCCINATE ER 100 MG PO TB24: 100.0000 mg | ORAL_TABLET | Freq: Every day | ORAL | 3 refills | Status: DC

## 2019-10-27 ENCOUNTER — Ambulatory Visit (HOSPITAL_COMMUNITY)
Admission: RE | Admit: 2019-10-27 | Discharge: 2019-10-27 | Disposition: A | Discharge status: Home / Self Care | Payer: Medicare Other | Source: Ambulatory Visit | Attending: Cardiology | Admitting: Cardiology

## 2019-10-27 ENCOUNTER — Other Ambulatory Visit: Payer: Self-pay

## 2019-10-27 ENCOUNTER — Encounter (HOSPITAL_COMMUNITY): Payer: Medicare Other

## 2019-10-27 DIAGNOSIS — I739 Peripheral vascular disease, unspecified: Secondary | ICD-10-CM | POA: Diagnosis present

## 2019-11-03 ENCOUNTER — Telehealth: Payer: Self-pay | Admitting: Pharmacy Technician

## 2019-11-03 NOTE — Telephone Encounter (Signed)
Received fax from [employer], they referred patient to Manufacturer Assistance for financial assistance for Humira . Called patient to follow up, had to leave a message.  Appears last prescriptions directions stated to inject once a week, this may be causing the confusion.  Ran test claim for 2 Humira pens for 1 month, patient's copay is $293.32. Patient is in the coverage gap.  9:34 AM Beatriz Chancellor, CPhT

## 2019-11-10 NOTE — Telephone Encounter (Signed)
Left message for patient to see if she needs any assistance in getting her medication.  1:35 PM Margaret Hobbs, CPhT

## 2019-11-27 ENCOUNTER — Other Ambulatory Visit: Payer: Self-pay | Admitting: Cardiovascular Disease

## 2019-11-28 ENCOUNTER — Other Ambulatory Visit: Payer: Self-pay | Admitting: Cardiovascular Disease

## 2019-12-18 ENCOUNTER — Other Ambulatory Visit: Payer: Self-pay | Admitting: Rheumatology

## 2019-12-18 DIAGNOSIS — L4050 Arthropathic psoriasis, unspecified: Secondary | ICD-10-CM

## 2019-12-22 NOTE — Telephone Encounter (Addendum)
Last Visit: 10/09/2019 Next Visit: 03/11/2020 Labs: 11/04/2019 glucose 218, BUN 24, potassium 3.3, RDW 11.5 TB Gold: 07/01/2019 negative    Okay to refill per Dr. Estanislado Pandy.

## 2020-01-07 ENCOUNTER — Telehealth: Payer: Self-pay | Admitting: Pharmacy Technician

## 2020-01-07 NOTE — Telephone Encounter (Signed)
Received notification from [facility] regarding a prior authorization for HUMIRA. Authorization has been APPROVED from 01/07/20 to 12/24/20.   Will send document to scan center.  Authorization # [id number] Phone # [phone]

## 2020-01-07 NOTE — Telephone Encounter (Signed)
Submitted a Prior Authorization request to [employer] for HUMIRA via Cover My Meds. Will update once we receive a response.    

## 2020-01-17 ENCOUNTER — Other Ambulatory Visit: Payer: Self-pay | Admitting: Cardiovascular Disease

## 2020-01-20 ENCOUNTER — Encounter: Payer: Self-pay | Admitting: Cardiovascular Disease

## 2020-01-20 ENCOUNTER — Ambulatory Visit: Payer: Medicare Other | Admitting: Cardiovascular Disease

## 2020-01-20 ENCOUNTER — Telehealth: Payer: Self-pay | Admitting: *Deleted

## 2020-01-20 ENCOUNTER — Other Ambulatory Visit: Payer: Self-pay

## 2020-01-20 DIAGNOSIS — I739 Peripheral vascular disease, unspecified: Secondary | ICD-10-CM | POA: Diagnosis not present

## 2020-01-20 NOTE — Assessment & Plan Note (Signed)
History of peripheral arterial disease with left calf claudication and recent Dopplers performed 10/27/2019 revealing a left ABI of 0.57 with an occluded left SFA.  She is fairly sedentary during COVID-19 wishes to wait 6 months before contemplating revascularization.

## 2020-01-20 NOTE — Telephone Encounter (Signed)
Results from labs drawn on 12/25/19  Elevated glucose 218 BUN 24 Potassium 3.3 RDW 11.5  Patient on Humira. Reviewed by Sherron Ales, PA-C

## 2020-01-20 NOTE — Patient Instructions (Signed)
Medication Instructions:  Your physician recommends that you continue on your current medications as directed. Please refer to the Current Medication list given to you today.  If you need a refill on your cardiac medications before your next appointment, please call your pharmacy.   Lab work: NONE  Testing/Procedures: NONE  Follow-Up: At [facility], you and your health needs are our priority.  As part of our continuing mission to provide you with exceptional heart care, we have created designated Provider Care Teams.  These Care Teams include your primary Cardiologist (physician) and Advanced Practice Providers (APPs -  Physician Assistants and Nurse Practitioners) who all work together to provide you with the care you need, when you need it. You may see Sr. Allyson Sabal or one of the following Advanced Practice Providers on your designated Care Team:    Corine Shelter, PA-C  [place], [place]  Edd Fabian, [place]  Your physician wants you to follow-up in: 6 months with Dr. Allyson Sabal to discuss possible angiogram

## 2020-01-20 NOTE — Progress Notes (Signed)
01/20/2020 Margaret Hobbs   [birth year]  [id number]  Primary Physician Martha Clan, MD Primary Cardiologist: Runell Gess MD Nicholes Calamity, [place]  HPI:  Margaret Hobbs is a 69 y.o.  moderately overweight widowed Caucasian female mother of 1, grandmother of 4 grandchildren referred to me by Dr. Eden Emms for peripheral vascular valuation because of symptomatic left calf claudication.  I last saw her in the office 10/17/2019. She has a history of remote stroke back in June 2008 and remote inferior wall myocardial infarction January of the way treated with RCA bare-metal stenting.  She has treated hypertension, diabetes and hyperlipidemia.  She was complaining of left calf claudication when Dr. Eden Emms saw her back in February.  Lower extremity arterial Doppler studies performed 03/03/2019 revealed a right ABI of 1 and a left ABI 0.7.  She did have a high-frequency signal in her proximal right SFA and tibial vessel disease on the left.  Since I saw her last she did have a follow-up Doppler study performed 10/28/2019 revealing a left ABI of 0.57 with an occluded left SFA.  She says she is been fairly sedentary during COVID-19 and is minimally symptomatic from her calf claudication and wishes to wait 6 months before contemplating revascularization.    Current Meds  Medication Sig  . amoxicillin (AMOXIL) 500 MG capsule Take 2,000 mg by mouth as directed. Takes 1 prior before dental procedures  . Ascorbic Acid (VITAMIN C) 500 MG tablet Take 500 mg by mouth daily.    [name] aspirin 81 MG tablet Take 81 mg by mouth daily.    . cholecalciferol (VITAMIN D) 1000 UNITS tablet Take 2,000 Units by mouth daily.  . cilostazol (PLETAL) 50 MG tablet Take 1 tablet (50 mg total) by mouth 2 (two) times daily.  . clopidogrel (PLAVIX) 75 MG tablet TAKE 1 TABLET BY MOUTH  DAILY  . co-enzyme Q-10 30 MG capsule Take 30 mg by mouth daily.   . cyanocobalamin 1000 MCG tablet Take 1,000 mcg by mouth daily.    . folic acid (FOLVITE) 1 MG tablet TAKE 2 TABLETS (2MG ) BY  MOUTH EVERY MORNING  . glipiZIDE-metformin (METAGLIP) 5-500 MG per tablet Take 1 tablet by mouth 2 (two) times daily before a meal.    . HUMIRA 40 MG/0.4ML PSKT INJECT 40MG  SUBCUTANEOUSLY  EVERY OTHER WEEK  . hydrALAZINE (APRESOLINE) 50 MG tablet TAKE 1 TABLET BY MOUTH TWO  TIMES DAILY. Please make yearly appt with Dr. for February for future refills. 1st attempt  . hydrochlorothiazide (HYDRODIURIL) 25 MG tablet   . metoprolol succinate (TOPROL-XL) 100 MG 24 hr tablet Take 1 tablet (100 mg total) by mouth daily. Please make yearly appt with Dr. Eden Emms for February before anymore refills. 1st attempt  . nitroGLYCERIN (NITROSTAT) 0.4 MG SL tablet PLACE 1 TABLET UNDER THE TONGUE EVERY 5 (FIVE) MINUTES AS NEEDED FOR CHEST PAIN (3 DOSES MAX)  . olmesartan-hydrochlorothiazide (BENICAR HCT) 40-25 MG tablet   . potassium chloride 20 MEQ/15ML (10%) SOLN   . ramipril (ALTACE) 10 MG capsule   . rosuvastatin (CRESTOR) 20 MG tablet   . simvastatin (ZOCOR) 40 MG tablet Take 1 tablet by mouth daily.  . vitamin A Eden Emms UNIT capsule Take 10,000 Units by mouth daily.    . vitamin E 400 UNIT capsule Take 400 Units by mouth daily.    . zoledronic acid (RECLAST) 5 MG/100ML SOLN injection Inject 5 mg into the vein once.   . [DISCONTINUED] potassium chloride SA (K-DUR) 20  MEQ tablet   . [DISCONTINUED] POTASSIUM PO Take by mouth daily.     Allergies  Allergen Reactions  . Remicade [Infliximab] Other (See Comments)    Convulsion and felt cold   . Sulfa Antibiotics Rash    Social History   Socioeconomic History  . Marital status: Widowed    Spouse name: Not on file  . Number of children: 1  . Years of education: Not on file  . Highest education level: Not on file  Occupational History  . Not on file  Tobacco Use  . Smoking status: Never Smoker  . Smokeless tobacco: Never Used  Substance and Sexual Activity  . Alcohol use: No  . Drug  use: Never  . Sexual activity: Not on file  Other Topics Concern  . Not on file  Social History Narrative  . Not on file   Social Determinants of Health   Financial Resource Strain:   . Difficulty of Paying Living Expenses: Not on file  Food Insecurity:   . Worried About Programme researcher, broadcasting/film/video in the Last Year: Not on file  . Ran Out of Food in the Last Year: Not on file  Transportation Needs:   . Lack of Transportation (Medical): Not on file  . Lack of Transportation (Non-Medical): Not on file  Physical Activity:   . Days of Exercise per Week: Not on file  . Minutes of Exercise per Session: Not on file  Stress:   . Feeling of Stress : Not on file  Social Connections:   . Frequency of Communication with Friends and Family: Not on file  . Frequency of Social Gatherings with Friends and Family: Not on file  . Attends Religious Services: Not on file  . Active Member of Clubs or Organizations: Not on file  . Attends Banker Meetings: Not on file  . Marital Status: Not on file  Intimate Partner Violence:   . Fear of Current or Ex-Partner: Not on file  . Emotionally Abused: Not on file  . Physically Abused: Not on file  . Sexually Abused: Not on file     Review of Systems: General: negative for chills, fever, night sweats or weight changes.  Cardiovascular: negative for chest pain, dyspnea on exertion, edema, orthopnea, palpitations, paroxysmal nocturnal dyspnea or shortness of breath Dermatological: negative for rash Respiratory: negative for cough or wheezing Urologic: negative for hematuria Abdominal: negative for nausea, vomiting, diarrhea, bright red blood per rectum, melena, or hematemesis Neurologic: negative for visual changes, syncope, or dizziness All other systems reviewed and are otherwise negative except as noted above.    Blood pressure 114/66, pulse 66, temperature (!) 96.2 F (35.7 C), height 5\' 2"  (1.575 m), weight 197 lb 6.4 oz (89.5 kg), SpO2 96  %.  General appearance: alert and no distress Neck: no adenopathy, no carotid bruit, no JVD, supple, symmetrical, trachea midline and thyroid not enlarged, symmetric, no tenderness/mass/nodules Lungs: clear to auscultation bilaterally Heart: regular rate and rhythm, S1, S2 normal, no murmur, click, rub or gallop Extremities: extremities normal, atraumatic, no cyanosis or edema Pulses: 2+ and symmetric Skin: Skin color, texture, turgor normal. No rashes or lesions Neurologic: Alert and oriented X 3, normal strength and tone. Normal symmetric reflexes. Normal coordination and gait  EKG not performed today  ASSESSMENT AND PLAN:   Peripheral arterial disease (HCC) History of peripheral arterial disease with left calf claudication and recent Dopplers performed 10/27/2019 revealing a left ABI of 0.57 with an occluded left SFA.  She is fairly sedentary during COVID-19 wishes to wait 6 months before contemplating revascularization.      Lorretta Harp MD FACP,FACC,FAHA, [facility] 01/20/2020 9:58 AM

## 2020-02-08 NOTE — Progress Notes (Signed)
Cardiology Office Note:    Virtual Visit via Video Note   This visit type was conducted due to national recommendations for restrictions regarding the COVID-19 Pandemic (e.g. social distancing) in an effort to limit this patient's exposure and mitigate transmission in our community.  Due to her co-morbid illnesses, this patient is at least at moderate risk for complications without adequate follow up.  This format is felt to be most appropriate for this patient at this time.  All issues noted in this document were discussed and addressed.  A limited physical exam was performed with this format.  Please refer to the patient's chart for her consent to telehealth for [facility].   Patient Location : Home Physician Location: Office  Date:  02/13/2020   ID:  Margaret Hobbs, DOB 1951/01/23, MRN [id number]  PCP:  Marton Redwood, MD  Cardiologist:  Dr. Jenkins Rouge    Referring MD: Marton Redwood, MD    History of Present Illness:    Margaret Hobbs is a 69 y.o. female with a hx of CAD s/p inferior STEMI in 1/08 tx with BMS to the RCA, HTN, HL, carotid artery disease, prior TIA, psoriatic arthritis. She denies chest pain, shortness of breath, syncope, orthopnea, PND or significant pedal edema. She is mainly limited by her arthritis. Compliant with meds On statin labs followed By primary Last dupex carotids with plaque no stenosis in ICA;s 03/03/19   Claudication left calf ABI 10/27/19 decreased to 0.57 on left Duplex 10/27/19 showed occluded left SFA Seen by Dr Gwenlyn Found 01/20/20 deferred any Rx Patient has been sedentary during [place]  She has been on Pletal since October with no resting leg pain  Getting vaccine for COVID next week  Prior CV studies that were reviewed today include:    LE Arterial Duplex :  10/27/19 Occluded left SFA ABI 0.57   Carotid US 03/03/19  bilat ICA 1-39  LHC 6/08 LM distal minor stenosis LAD prox 30, mid 13, distal 21; D2 ostial 10 RCA prox 60, mid 69, distal  stent patent; post AV segment prox 95 EF 60  Past Medical History:  Diagnosis Date  . Aphasia   . Arthritis   . CAD (coronary artery disease)   . Cerebrovascular disease   . CVA (cerebral vascular accident) ([place])   . Depressed   . Diabetes mellitus   . DM2 (diabetes mellitus, type 2) ([place])   . HLD (hyperlipidemia)   . HTN (hypertension)   . Hypercholesteremia   . Hyperparathyroidism   . MI (myocardial infarction) ([place])   . Osteoporosis   . TIA (transient ischemic attack)   . UTI (urinary tract infection)     Past Surgical History:  Procedure Laterality Date  . ABDOMINAL HYSTERECTOMY    . hysterectomy - unknown type      Current Medications: Current Meds  Medication Sig  . amoxicillin (AMOXIL) 500 MG capsule Take 2,000 mg by mouth as directed. Takes 1 prior before dental procedures  . Ascorbic Acid (VITAMIN C) 500 MG tablet Take 500 mg by mouth daily.    [name] aspirin 81 MG tablet Take 81 mg by mouth daily.    . cholecalciferol (VITAMIN D) 1000 UNITS tablet Take 2,000 Units by mouth daily.  . clopidogrel (PLAVIX) 75 MG tablet TAKE 1 TABLET BY MOUTH  DAILY  . co-enzyme Q-10 30 MG capsule Take 30 mg by mouth daily.   . cyanocobalamin 1000 MCG tablet Take 1,000 mcg by mouth daily.   . folic acid (FOLVITE) 1  MG tablet TAKE 2 TABLETS (2MG ) BY  MOUTH EVERY MORNING  . glipiZIDE-metformin (METAGLIP) 5-500 MG per tablet Take 1 tablet by mouth 2 (two) times daily before a meal.    . HUMIRA 40 MG/0.4ML PSKT INJECT 40MG  SUBCUTANEOUSLY  EVERY OTHER WEEK  . hydrALAZINE (APRESOLINE) 50 MG tablet TAKE 1 TABLET BY MOUTH TWO  TIMES DAILY. Please make yearly appt with Dr. for February for future refills. 1st attempt  . metoprolol succinate (TOPROL-XL) 100 MG 24 hr tablet Take 1 tablet (100 mg total) by mouth daily. Please make yearly appt with Dr. Eden Emms for February before anymore refills. 1st attempt  . nitroGLYCERIN (NITROSTAT) 0.4 MG SL tablet PLACE 1 TABLET UNDER THE TONGUE EVERY 5  (FIVE) MINUTES AS NEEDED FOR CHEST PAIN (3 DOSES MAX)  . olmesartan-hydrochlorothiazide (BENICAR HCT) 40-25 MG tablet   . potassium chloride 20 MEQ/15ML (10%) SOLN   . ramipril (ALTACE) 10 MG capsule   . rosuvastatin (CRESTOR) 20 MG tablet   . vitamin A Eden Emms UNIT capsule Take 10,000 Units by mouth daily.    . vitamin E 400 UNIT capsule Take 400 Units by mouth daily.    . zoledronic acid (RECLAST) 5 MG/100ML SOLN injection Inject 5 mg into the vein once.      Allergies:   Remicade [infliximab] and Sulfa antibiotics   Social History   Socioeconomic History  . Marital status: Widowed    Spouse name: Not on file  . Number of children: 1  . Years of education: Not on file  . Highest education level: Not on file  Occupational History  . Not on file  Tobacco Use  . Smoking status: Never Smoker  . Smokeless tobacco: Never Used  Substance and Sexual Activity  . Alcohol use: No  . Drug use: Never  . Sexual activity: Not on file  Other Topics Concern  . Not on file  Social History Narrative  . Not on file   Social Determinants of Health   Financial Resource Strain:   . Difficulty of Paying Living Expenses: Not on file  Food Insecurity:   . Worried About March in the Last Year: Not on file  . Ran Out of Food in the Last Year: Not on file  Transportation Needs:   . Lack of Transportation (Medical): Not on file  . Lack of Transportation (Non-Medical): Not on file  Physical Activity:   . Days of Exercise per Week: Not on file  . Minutes of Exercise per Session: Not on file  Stress:   . Feeling of Stress : Not on file  Social Connections:   . Frequency of Communication with Friends and Family: Not on file  . Frequency of Social Gatherings with Friends and Family: Not on file  . Attends Religious Services: Not on file  . Active Member of Clubs or Organizations: Not on file  . Attends [id number] Meetings: Not on file  . Marital Status: Not on file      Family History:  The patient's family history includes Alcohol abuse in an other family member; Coronary artery disease in an other family member; Diabetes in her brother and another family member; Heart disease in her mother; Hyperlipidemia in her mother; Hypertension in her mother, son, and another family member.    EKGs/Labs/Other Test Reviewed:    EKG:   02/04/18 SR rate 75 poor R wave progression 02/06/19 SR rate 67 poor R wave progression   Recent Labs: 07/01/2019: ALT  19; BUN 27; Creat 0.99; Hemoglobin 12.8; Platelets 250; Potassium 3.9; Sodium 139   Recent Lipid Panel    Component Value Date/Time   CHOL 138 08/24/2016 0912   TRIG 197 (H) 08/24/2016 0912   HDL 40 (L) 08/24/2016 0912   CHOLHDL 3.5 08/24/2016 0912   VLDL 39 (H) 08/24/2016 0912   LDLCALC 59 08/24/2016 0912     Physical Exam:    VS:  There were no vitals taken for this visit.    Wt Readings from Last 3 Encounters:  01/20/20 197 lb 6.4 oz (89.5 kg)  10/17/19 201 lb 6.4 oz (91.4 kg)  10/09/19 202 lb 3.2 oz (91.7 kg)     Overweight white female No tachypnea Skin warm and dry No JVP elevation    PLAN:    In order of problems listed above:  1. CAD - s/p remote MI in 2008 with BMS to the RCA.  She had residual mod non-obstructive CAD at that time treated medically.  She has not had any further angina.   Continue ASA, Plavix, statin, beta-blocker.   2. HTN - Well controlled.  Continue current medications and low sodium Dash type diet.    3. HL - Managed by PCP.   Continue Simvastatin.  4. Carotid artery disease - plaque no stenosis on duplex 03/03/19 observe   5. PVD:  F/U with Dr Allyson Sabal ABI 0.57 on left with occluded left SFA  F/U with DR Allyson Sabal in July or sooner if claudication worsens  F/U with me in a year   Time: spent reviewing vascular studies, writing note and direct patient interview 20 minutes    Charlton Haws

## 2020-02-12 ENCOUNTER — Telehealth: Payer: Self-pay

## 2020-02-12 NOTE — Telephone Encounter (Signed)

## 2020-02-13 ENCOUNTER — Telehealth (INDEPENDENT_AMBULATORY_CARE_PROVIDER_SITE_OTHER): Payer: Medicare Other | Admitting: Cardiovascular Disease

## 2020-02-13 DIAGNOSIS — I2510 Atherosclerotic heart disease of native coronary artery without angina pectoris: Secondary | ICD-10-CM

## 2020-02-13 DIAGNOSIS — I10 Essential (primary) hypertension: Secondary | ICD-10-CM | POA: Diagnosis not present

## 2020-02-13 DIAGNOSIS — I739 Peripheral vascular disease, unspecified: Secondary | ICD-10-CM

## 2020-02-13 DIAGNOSIS — I252 Old myocardial infarction: Secondary | ICD-10-CM | POA: Diagnosis not present

## 2020-02-13 DIAGNOSIS — E785 Hyperlipidemia, unspecified: Secondary | ICD-10-CM

## 2020-02-13 DIAGNOSIS — Z955 Presence of coronary angioplasty implant and graft: Secondary | ICD-10-CM

## 2020-02-13 NOTE — Patient Instructions (Addendum)
Medication Instructions:  *If you need a refill on your cardiac medications before your next appointment, please call your pharmacy*  Lab Work: If you have labs (blood work) drawn today and your tests are completely normal, you will receive your results only by: [name] MyChart Message (if you have MyChart) OR . A paper copy in the mail If you have any lab test that is abnormal or we need to change your treatment, we will call you to review the results.  Testing/Procedures:   Follow-Up: At [facility], you and your health needs are our priority.  As part of our continuing mission to provide you with exceptional heart care, we have created designated Provider Care Teams.  These Care Teams include your primary Cardiologist (physician) and Advanced Practice Providers (APPs -  Physician Assistants and Nurse Practitioners) who all work together to provide you with the care you need, when you need it.  Your next appointment:   12 month(s)  The format for your next appointment:   In Person  Provider:   You may see Charlton Haws, MD or one of the following Advanced Practice Providers on your designated Care Team:    Norma Fredrickson, NP  Nada Boozer, NP  Georgie Chard, NP

## 2020-02-17 ENCOUNTER — Other Ambulatory Visit: Payer: Self-pay | Admitting: Cardiovascular Disease

## 2020-03-05 NOTE — Progress Notes (Deleted)
Office Visit Note  Patient: Margaret Hobbs             Date of Birth: [birth year]           MRN: [id number]             PCP: Martha Clan, MD Referring: Martha Clan, MD Visit Date: 03/11/2020 Occupation: @GUAROCC @  Subjective:  No chief complaint on file.   History of Present Illness: Margaret Hobbs is a 69 y.o. female ***   Activities of Daily Living:  Patient reports morning stiffness for *** {minute/hour:19697}.   Patient {ACTIONS;DENIES/REPORTS:21021675::"Denies"} nocturnal pain.  Difficulty dressing/grooming: {ACTIONS;DENIES/REPORTS:21021675::"Denies"} Difficulty climbing stairs: {ACTIONS;DENIES/REPORTS:21021675::"Denies"} Difficulty getting out of chair: {ACTIONS;DENIES/REPORTS:21021675::"Denies"} Difficulty using hands for taps, buttons, cutlery, and/or writing: {ACTIONS;DENIES/REPORTS:21021675::"Denies"}  No Rheumatology ROS completed.   PMFS History:  Patient Active Problem List   Diagnosis Date Noted  . Peripheral arterial disease (HCC) 10/17/2019  . Carotid artery disease (HCC) 01/24/2017  . Primary osteoarthritis of both knees 11/29/2016  . Other psoriasis 11/28/2016  . Psoriatic arthritis (HCC) 11/28/2016  . High risk medication use 11/28/2016  . Type 2 diabetes mellitus (HCC) 03/30/2009  . HYPERCHOLESTEROLEMIA 03/30/2009  . Dyslipidemia 03/30/2009  . Essential hypertension 03/30/2009  . MYOCARDIAL INFARCTION 03/30/2009  . Coronary artery disease involving native coronary artery of native heart without angina pectoris 03/30/2009  . Cerebral artery occlusion with cerebral infarction (HCC) 03/30/2009  . URINARY TRACT INFECTION 03/30/2009  . ARTHRITIS 03/30/2009  . HYPERPARATHYROIDISM, HX OF 03/30/2009    Past Medical History:  Diagnosis Date  . Aphasia   . Arthritis   . CAD (coronary artery disease)   . Cerebrovascular disease   . CVA (cerebral vascular accident) (H[facility])   . Depressed   . Diabetes mellitus   . DM2 (diabetes mellitus,  type 2) (H[facility])   . HLD (hyperlipidemia)   . HTN (hypertension)   . Hypercholesteremia   . Hyperparathyroidism   . MI (myocardial infarction) ([facility])   . Osteoporosis   . TIA (transient ischemic attack)   . UTI (urinary tract infection)     Family History  Problem Relation Age of Onset  . Heart disease Mother        pacemaker  . Hypertension Mother   . Hyperlipidemia Mother   . Diabetes Brother        pre-diabetic   . Hypertension Son   . Diabetes Other   . Hypertension Other   . Alcohol abuse Other   . Coronary artery disease Other    Past Surgical History:  Procedure Laterality Date  . ABDOMINAL HYSTERECTOMY    . hysterectomy - unknown type     Social History   Social History Narrative  . Not on file    There is no immunization history on file for this patient.   Objective: Vital Signs: There were no vitals taken for this visit.   Physical Exam   Musculoskeletal Exam: ***  CDAI Exam: CDAI Score: -- Patient Global: --; Provider Global: -- Swollen: --; Tender: -- Joint Exam 03/11/2020   No joint exam has been documented for this visit   There is currently no information documented on the homunculus. Go to the Rheumatology activity and complete the homunculus joint exam.  Investigation: No additional findings.  Imaging: No results found.  Recent Labs: Lab Results  Component Value Date   WBC 7.1 07/01/2019   HGB 12.8 07/01/2019   PLT 250 07/01/2019   NA 139 07/01/2019   K 3.9 07/01/2019   CL 101  07/01/2019   CO2 30 07/01/2019   GLUCOSE 129 (H) 07/01/2019   BUN 27 (H) 07/01/2019   CREATININE 0.99 07/01/2019   BILITOT 0.4 07/01/2019   ALKPHOS 64 08/01/2016   AST 22 07/01/2019   ALT 19 07/01/2019   PROT 7.3 07/01/2019   ALBUMIN 3.1 (L) 08/01/2016   CALCIUM 9.7 07/01/2019   GFRAA 69 07/01/2019   QFTBGOLDPLUS NEGATIVE 07/01/2019    Speciality Comments: No specialty comments available.  Procedures:  No procedures performed Allergies:  Remicade [infliximab] and Sulfa antibiotics   Assessment / Plan:     Visit Diagnoses: No diagnosis found.  Orders: No orders of the defined types were placed in this encounter.  No orders of the defined types were placed in this encounter.   Face-to-face time spent with patient was *** minutes. Greater than 50% of time was spent in counseling and coordination of care.  Follow-Up Instructions: No follow-ups on file.   Earnestine Mealing, CMA  Note - This record has been created using Editor, commissioning.  Chart creation errors have been sought, but may not always  have been located. Such creation errors do not reflect on  the standard of medical care.

## 2020-03-11 ENCOUNTER — Telehealth: Payer: Self-pay | Admitting: *Deleted

## 2020-03-11 ENCOUNTER — Ambulatory Visit: Payer: Medicare Other | Admitting: Rheumatology

## 2020-03-11 NOTE — Telephone Encounter (Signed)
Results received from [facility] on 03/09/20 Reviewed by Sherron Ales, PA-C   CBC/CMP/Vitamin D  Glucose 178 BUN 27 Potassium 3.3 Lym %8.9 RDW 12.0 Neu% 78.6 Vitamin D 34.5  Patient on Humira 40 mg every 14 days.

## 2020-03-25 ENCOUNTER — Other Ambulatory Visit: Payer: Self-pay | Admitting: Rheumatology

## 2020-03-25 DIAGNOSIS — L4050 Arthropathic psoriasis, unspecified: Secondary | ICD-10-CM

## 2020-03-25 NOTE — Telephone Encounter (Signed)
Last Visit: 10/09/2019 Next Visit: 03/31/2020 Labs: 03/09/20 Glucose 178 BUN 27 Potassium 3.3 Lym %8.9 RDW 12.0 Neu% 78.6 TB Gold: 07/01/2019 negative   Okay to refill per Dr. Corliss Skains

## 2020-03-29 NOTE — Progress Notes (Signed)
Office Visit Note  Patient: Margaret Hobbs             Date of Birth: [birth year]           MRN: [id number]             PCP: Martha Clan, MD Referring: Martha Clan, MD Visit Date: 03/31/2020 Occupation: @GUAROCC @  Subjective:  Stiffness in both hands   History of Present Illness: Margaret Hobbs is a 69 y.o. female with history of psoriatic arthritis and osteoarthritis.  Patient is on Humira 40 mg subcutaneous injections every 14 days.  She has not missed any doses of Humira recently.  Has not had any recent infections.  She has received both COVID-19 vaccinations.  She denies any recent psoriatic arthritis flares.  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  She says she occasionally experiences SI joint pain in her lower back if she standing for prolonged periods of time.  She denies any Achilles tendinitis at this time.  She has intermittent mild plantar fasciitis.  She tries to wear proper fitting shoes.  She denies any joint swelling at this time.  She states that she has a few small patches of psoriasis behind the left ear at this time.   Activities of Daily Living:  Patient reports morning stiffness for a few minutes.   Patient Denies nocturnal pain.  Difficulty dressing/grooming: Denies Difficulty climbing stairs: Denies Difficulty getting out of chair: Denies Difficulty using hands for taps, buttons, cutlery, and/or writing: Denies  Review of Systems  Constitutional: Negative for fatigue.  HENT: Negative for mouth sores, mouth dryness and nose dryness.   Eyes: Negative for pain, itching, visual disturbance and dryness.  Respiratory: Negative for cough, hemoptysis, shortness of breath and difficulty breathing.   Cardiovascular: Negative for chest pain, palpitations, hypertension and swelling in legs/feet.  Gastrointestinal: Positive for  constipation and diarrhea. Negative for blood in stool.  Endocrine: Negative for increased urination.  Genitourinary: Negative for difficulty urinating and painful urination.  Musculoskeletal: Positive for arthralgias, joint pain and morning stiffness. Negative for joint swelling, myalgias, muscle weakness, muscle tenderness and myalgias.  Skin: Positive for rash. Negative for color change, pallor, hair loss, nodules/bumps, skin tightness, ulcers and sensitivity to sunlight.  Allergic/Immunologic: Negative for susceptible to infections.  Neurological: Negative for dizziness, numbness, headaches, memory loss and weakness.  Hematological: Positive for bruising/bleeding tendency. Negative for swollen glands.  Psychiatric/Behavioral: Negative for depressed mood, confusion and sleep disturbance. The patient is not nervous/anxious.     PMFS History:  Patient Active Problem List   Diagnosis Date Noted  . Peripheral arterial disease (HCC) 10/17/2019  . Carotid artery disease (HCC) 01/24/2017  . Primary osteoarthritis of both knees 11/29/2016  . Other psoriasis 11/28/2016  . Psoriatic arthritis (HCC) 11/28/2016  . High risk medication use 11/28/2016  . Type 2 diabetes mellitus (HCC) 03/30/2009  . HYPERCHOLESTEROLEMIA 03/30/2009  . Dyslipidemia 03/30/2009  . Essential hypertension 03/30/2009  . MYOCARDIAL INFARCTION 03/30/2009  . Coronary artery disease involving native coronary artery of native heart without angina pectoris 03/30/2009  . Cerebral artery occlusion with cerebral infarction (HCC) 03/30/2009  . URINARY TRACT INFECTION 03/30/2009  . ARTHRITIS 03/30/2009  . HYPERPARATHYROIDISM, HX OF 03/30/2009    Past Medical History:  Diagnosis Date  . Aphasia   . Arthritis   . CAD (coronary artery disease)   . Cerebrovascular disease   .  CVA (cerebral vascular accident) ([place])   . Depressed   . Diabetes mellitus   . DM2 (diabetes mellitus, type 2) ([place])   . HLD (hyperlipidemia)   . HTN  (hypertension)   . Hypercholesteremia   . Hyperparathyroidism   . MI (myocardial infarction) ([place])   . Osteoporosis   . TIA (transient ischemic attack)   . UTI (urinary tract infection)     Family History  Problem Relation Age of Onset  . Heart disease Mother        pacemaker  . Hypertension Mother   . Hyperlipidemia Mother   . Diabetes Brother        pre-diabetic   . Hypertension Son   . Sleep apnea Son   . Diabetes Other   . Hypertension Other   . Alcohol abuse Other   . Coronary artery disease Other    Past Surgical History:  Procedure Laterality Date  . ABDOMINAL HYSTERECTOMY    . hysterectomy - unknown type     Social History   Social History Narrative  . Not on file    There is no immunization history on file for this patient.   Objective: Vital Signs: BP 132/66 (BP Location: Left Arm, Patient Position: Sitting, Cuff Size: Normal)   Pulse 79   Resp 15   Ht 5\' 2"  (1.575 m)   Wt 199 lb (90.3 kg)   BMI 36.40 kg/m    Physical Exam Vitals and nursing note reviewed.  Constitutional:      Appearance: She is well-developed.  HENT:     Head: Normocephalic and atraumatic.  Eyes:     Conjunctiva/sclera: Conjunctivae normal.  Pulmonary:     Effort: Pulmonary effort is normal.  Abdominal:     General: Bowel sounds are normal.     Palpations: Abdomen is soft.  Musculoskeletal:     Cervical back: Normal range of motion.  Lymphadenopathy:     Cervical: No cervical adenopathy.  Skin:    General: Skin is warm and dry.     Capillary Refill: Capillary refill takes less than 2 seconds.     Comments: Small patches of psoriasis behind the left ear  Neurological:     Mental Status: She is alert and oriented to person, place, and time.  Psychiatric:        Behavior: Behavior normal.      Musculoskeletal Exam: C-spine good range of motion with no discomfort.  She has mild thoracic kyphosis.  Slight limited range of motion lumbar spine.  No midline spinal  tenderness.  No SI joint tenderness.  Shoulder joints and elbow joints have good range of motion with no discomfort.  Wrist joints have limited range of motion.  MCPs, PIPs, DIPs good range of motion with no synovitis.  She has tenderness of the right second MCP joint but no inflammation was noted.  Hip joints have good range of motion with no discomfort.  Knee joints have good range of motion with no warmth or effusion.  Ankle joints have good range of motion with no tenderness or inflammation.  No Achilles tendinitis or plantar fasciitis.  CDAI Exam: CDAI Score: -- Patient Global: --; Provider Global: -- Swollen: --; Tender: -- Joint Exam 03/31/2020   No joint exam has been documented for this visit   There is currently no information documented on the homunculus. Go to the Rheumatology activity and complete the homunculus joint exam.  Investigation: No additional findings.  Imaging: No results found.  Recent Labs: Lab  Results  Component Value Date   WBC 7.1 07/01/2019   HGB 12.8 07/01/2019   PLT 250 07/01/2019   NA 139 07/01/2019   K 3.9 07/01/2019   CL 101 07/01/2019   CO2 30 07/01/2019   GLUCOSE 129 (H) 07/01/2019   BUN 27 (H) 07/01/2019   CREATININE 0.99 07/01/2019   BILITOT 0.4 07/01/2019   ALKPHOS 64 08/01/2016   AST 22 07/01/2019   ALT 19 07/01/2019   PROT 7.3 07/01/2019   ALBUMIN 3.1 (L) 08/01/2016   CALCIUM 9.7 07/01/2019   GFRAA 69 07/01/2019   QFTBGOLDPLUS NEGATIVE 07/01/2019    Speciality Comments: No specialty comments available.  Procedures:  No procedures performed Allergies: Remicade [infliximab] and Sulfa antibiotics   Assessment / Plan:     Visit Diagnoses: Psoriatic arthritis (HCC): She has no synovitis or dactylitis on exam.  She has not had any recent psoriatic arthritis flares.  She is clinically doing well on Humira 40 mg subcutaneous injections every 14 days.  She has not missed any doses of Humira recently.  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  She has limited range of motion of both wrist joints but no tenderness or inflammation was noted.  She experiences intermittent SI joint pain if she stands for prolonged periods of time.  She had no SI joint tenderness on exam today.  No Achilles denies or plantar fasciitis was noted.  She will continue on Humira as prescribed.  She was advised to notify us if she develops increased joint pain or joint swelling.  She will follow-up in the office in 5 months.  Other psoriasis: She has a few small patches of psoriasis behind the left ear.  She will continue on Humira as prescribed.  High risk medication use - Humira 40 mg sq injections every 14 days. Last TB gold negative on 07/01/2019 and will monitor yearly.  CBC and CMP were drawn on 03/09/2020.  She has lab work drawn at [facility] every other month.  TB gold was negative on 07/01/2019.  A future order for TB gold was placed today.  She has not had any recent infections.  We discussed holding Humira if she develops any signs or symptoms of an infection and to resume once the infection is completely cleared.  She is received both COVID-19 vaccinations.  We discussed the importance of scheduling yearly skin exams while on Humira due to the increased risk of skin cancer.  Age-related osteoporosis without current pathological fracture - She is on Reclast IV infusion yearly prescribed by Dr. Clelia Croft.   Primary osteoarthritis of both knees: She has good range of motion of both knee joints.  No warmth or effusion noted.  She has not had any increased discomfort or joint swelling in her knees recently.  Other medical conditions are listed as follows  History of coronary artery disease  History of hypercholesterolemia  History of stroke  History of diabetes mellitus  History of hypertension  Dyslipidemia  Orders: Orders Placed  This Encounter  Procedures  . QuantiFERON-TB Gold Plus   No orders of the defined types were placed in this encounter.     Follow-Up Instructions: Return in about 5 months (around 08/31/2020) for Psoriatic arthritis.   Gearldine Bienenstock, PA-C  Note - This record has been created using Dragon software.  Chart creation errors have been sought, but may not always  have  been located. Such creation errors do not reflect on  the standard of medical care.

## 2020-03-31 ENCOUNTER — Ambulatory Visit: Payer: Medicare Other | Admitting: Physician Assistant

## 2020-03-31 ENCOUNTER — Encounter: Payer: Self-pay | Admitting: Physician Assistant

## 2020-03-31 ENCOUNTER — Other Ambulatory Visit: Payer: Self-pay

## 2020-03-31 VITALS — BP 132/66 | HR 79 | Resp 15 | Ht 62.0 in | Wt 199.0 lb

## 2020-03-31 DIAGNOSIS — L408 Other psoriasis: Secondary | ICD-10-CM | POA: Diagnosis not present

## 2020-03-31 DIAGNOSIS — Z8639 Personal history of other endocrine, nutritional and metabolic disease: Secondary | ICD-10-CM

## 2020-03-31 DIAGNOSIS — Z79899 Other long term (current) drug therapy: Secondary | ICD-10-CM | POA: Diagnosis not present

## 2020-03-31 DIAGNOSIS — M810 Age-related osteoporosis without current pathological fracture: Secondary | ICD-10-CM | POA: Diagnosis not present

## 2020-03-31 DIAGNOSIS — L4050 Arthropathic psoriasis, unspecified: Secondary | ICD-10-CM

## 2020-03-31 DIAGNOSIS — M170 Bilateral primary osteoarthritis of knee: Secondary | ICD-10-CM

## 2020-03-31 DIAGNOSIS — E785 Hyperlipidemia, unspecified: Secondary | ICD-10-CM

## 2020-03-31 DIAGNOSIS — Z8673 Personal history of transient ischemic attack (TIA), and cerebral infarction without residual deficits: Secondary | ICD-10-CM

## 2020-03-31 DIAGNOSIS — Z8679 Personal history of other diseases of the circulatory system: Secondary | ICD-10-CM

## 2020-03-31 NOTE — Patient Instructions (Signed)
Standing Labs We placed an order today for your standing lab work.    Please come back and get your TB gold drawn in July   We have open lab daily Monday through Thursday from 8:30-12:30 PM and 1:30-4:30 PM and Friday from 8:30-12:30 PM and 1:30-4:00 PM at the office of Dr. Pollyann Savoy.   You may experience shorter wait times on Monday and Friday afternoons. The office is located at [address], [address], [place], [place] [address] No appointment is necessary.   Labs are drawn by [facility].  You may receive a bill from [facility] for your lab work.  If you wish to have your labs drawn at another location, please call the office 24 hours in advance to send orders.  If you have any questions regarding directions or hours of operation,  please call [phone].   Just as a reminder please drink plenty of water prior to coming for your lab work. Thanks!

## 2020-04-01 ENCOUNTER — Encounter: Payer: Self-pay | Admitting: Ophthalmology

## 2020-04-07 ENCOUNTER — Other Ambulatory Visit: Payer: Self-pay | Admitting: Cardiovascular Disease

## 2020-05-06 ENCOUNTER — Ambulatory Visit (INDEPENDENT_AMBULATORY_CARE_PROVIDER_SITE_OTHER): Payer: Medicare Other | Admitting: Ophthalmology

## 2020-05-06 ENCOUNTER — Other Ambulatory Visit: Payer: Self-pay

## 2020-05-06 ENCOUNTER — Encounter (INDEPENDENT_AMBULATORY_CARE_PROVIDER_SITE_OTHER): Payer: Self-pay | Admitting: Ophthalmology

## 2020-05-06 DIAGNOSIS — H3562 Retinal hemorrhage, left eye: Secondary | ICD-10-CM | POA: Diagnosis not present

## 2020-05-06 DIAGNOSIS — H33321 Round hole, right eye: Secondary | ICD-10-CM | POA: Insufficient documentation

## 2020-05-06 DIAGNOSIS — E113491 Type 2 diabetes mellitus with severe nonproliferative diabetic retinopathy without macular edema, right eye: Secondary | ICD-10-CM | POA: Diagnosis not present

## 2020-05-06 DIAGNOSIS — E113492 Type 2 diabetes mellitus with severe nonproliferative diabetic retinopathy without macular edema, left eye: Secondary | ICD-10-CM

## 2020-05-06 NOTE — Assessment & Plan Note (Signed)
The nature of severe nonproliferative diabetic retinopathy discussed with the patient as well as the need for more frequent follow up and likely progression to proliferative disease in the near future. The options of continued observation versus panretinal photocoagulation at this time were reviewed as well as the risks, benefits, and alternatives. More recent option includes the use of ocular injectable medications to slow progression of retinal disease. Tight control of glucose, blood pressure, and serum lipid levels were recommended under the direction of general physician or endocrinologist, as well as avoidance of smoking and maintenance of normal body weight. The 2-year risk of progression to proliferative diabetic retinopathy is 60%.  OD, will plan peripheral PRP anteriorly in a month or so

## 2020-05-06 NOTE — Progress Notes (Signed)
05/06/2020     CHIEF COMPLAINT Patient presents for Diabetic Eye Exam   HISTORY OF PRESENT ILLNESS: Margaret Hobbs is a 69 y.o. female who presents to the clinic today for:   HPI    Diabetic Eye Exam    Vision is stable and is blurred for near.  Associated Symptoms Negative for Flashes and Floaters.  Diabetes characteristics include Type 2.  Blood sugar level is controlled.  Last A1C 6.2.  I, the attending physician,  performed the HPI with the patient and updated documentation appropriately.          Comments    6 Month Diabetic Exam OU. FP  Pt states DVA is stable. Pt has trouble seeing up close without readers. Denies FOL and floaters. Pt does not check blood sugar.       Last edited by Tilda Franco on 05/06/2020  9:31 AM. (History)      Referring physician: Marton Redwood, MD [place],  [place] [address]  HISTORICAL INFORMATION:   Selected notes from the MEDICAL RECORD NUMBER    Lab Results  Component Value Date   HGBA1C  06/02/2007    5.6 (NOTE)   The ADA recommends the following therapeutic goals for glycemic   control related to Hgb A1C measurement:   Goal of Therapy:   < 7.0% Hgb A1C   Action Suggested:  > 8.0% Hgb A1C   Ref:  Diabetes Care, 22, Suppl. 1, 1999     CURRENT MEDICATIONS: No current outpatient medications on file. (Ophthalmic Drugs)   No current facility-administered medications for this visit. (Ophthalmic Drugs)   Current Outpatient Medications (Other)  Medication Sig  . amoxicillin (AMOXIL) 500 MG capsule Take 2,000 mg by mouth as directed. Takes 1 prior before dental procedures  . Ascorbic Acid (VITAMIN C) 500 MG tablet Take 500 mg by mouth daily.    [name] aspirin 81 MG tablet Take 81 mg by mouth daily.    . cholecalciferol (VITAMIN D) 1000 UNITS tablet Take 2,000 Units by mouth daily.  . cilostazol (PLETAL) 50 MG tablet Take 1 tablet (50 mg total) by mouth 2 (two) times daily.  . clopidogrel (PLAVIX) 75 MG tablet TAKE 1  TABLET BY MOUTH  DAILY  . co-enzyme Q-10 30 MG capsule Take 30 mg by mouth daily.   . cyanocobalamin 1000 MCG tablet Take 1,000 mcg by mouth daily.   . folic acid (FOLVITE) 1 MG tablet TAKE 2 TABLETS (2MG ) BY  MOUTH EVERY MORNING (Patient taking differently: Take 1 mg by mouth daily. )  . glipiZIDE-metformin (METAGLIP) 5-500 MG per tablet Take 1 tablet by mouth 2 (two) times daily before a meal.    . HUMIRA 40 MG/0.4ML PSKT INJECT 40MG  SUBCUTANEOUSLY  EVERY OTHER WEEK  . hydrALAZINE (APRESOLINE) 50 MG tablet Take 1 tablet (50 mg total) by mouth in the morning and at bedtime.  . metoprolol succinate (TOPROL-XL) 100 MG 24 hr tablet Take 1 tablet (100 mg total) by mouth daily.  . nitroGLYCERIN (NITROSTAT) 0.4 MG SL tablet PLACE 1 TABLET UNDER THE TONGUE EVERY 5 (FIVE) MINUTES AS NEEDED FOR CHEST PAIN (3 DOSES MAX)  . olmesartan-hydrochlorothiazide (BENICAR HCT) 40-25 MG tablet Take 1 tablet by mouth daily.   . potassium chloride 20 MEQ/15ML (10%) SOLN 20 mEq daily.   . ramipril (ALTACE) 10 MG capsule   . rosuvastatin (CRESTOR) 20 MG tablet Take 20 mg by mouth daily.   . simvastatin (ZOCOR) 40 MG tablet Take 40 mg  by mouth at bedtime.  . vitamin A 26834 UNIT capsule Take 10,000 Units by mouth daily.    . vitamin E 400 UNIT capsule Take 400 Units by mouth daily.    . zoledronic acid (RECLAST) 5 MG/100ML SOLN injection Inject 5 mg into the vein once.    No current facility-administered medications for this visit. (Other)      REVIEW OF SYSTEMS: ROS    Positive for: Endocrine   Last edited by Elyse Jarvis on 05/06/2020  9:27 AM. (History)       ALLERGIES Allergies  Allergen Reactions  . Remicade [Infliximab] Other (See Comments)    Convulsion and felt cold   . Sulfa Antibiotics Rash    PAST MEDICAL HISTORY Past Medical History:  Diagnosis Date  . Aphasia   . Arthritis   . CAD (coronary artery disease)   . Cerebrovascular disease   . CVA (cerebral vascular accident) (HCC)     . Depressed   . Diabetes mellitus   . DM2 (diabetes mellitus, type 2) (HCC)   . HLD (hyperlipidemia)   . HTN (hypertension)   . Hypercholesteremia   . Hyperparathyroidism   . MI (myocardial infarction) (HCC)   . Osteoporosis   . TIA (transient ischemic attack)   . UTI (urinary tract infection)    Past Surgical History:  Procedure Laterality Date  . ABDOMINAL HYSTERECTOMY    . CATARACT EXTRACTION W/PHACO Bilateral 2017   Dr. Alben Spittle  . hysterectomy - unknown type      FAMILY HISTORY Family History  Problem Relation Age of Onset  . Heart disease Mother        pacemaker  . Hypertension Mother   . Hyperlipidemia Mother   . Diabetes Brother        pre-diabetic   . Hypertension Son   . Sleep apnea Son   . Diabetes Other   . Hypertension Other   . Alcohol abuse Other   . Coronary artery disease Other     SOCIAL HISTORY Social History   Tobacco Use  . Smoking status: Never Smoker  . Smokeless tobacco: Never Used  Substance Use Topics  . Alcohol use: No  . Drug use: Never         OPHTHALMIC EXAM: Base Eye Exam    Visual Acuity (Snellen - Linear)      Right Left   Dist cc 20/20 20/20 -1       Tonometry (Tonopen, 9:33 AM)      Right Left   Pressure 13 13       Pupils      Pupils Dark Light Shape React APD   Right PERRL 2.5 2 Round Brisk None   Left PERRL 2.5 2 Round Brisk None       Visual Fields (Counting fingers)      Left Right    Full Full       Neuro/Psych    Oriented x3: Yes   Mood/Affect: Normal       Dilation    Both eyes: 1.0% Mydriacyl, 2.5% Phenylephrine @ 9:33 AM        Slit Lamp and Fundus Exam    Slit Lamp Exam      Right Left   Lens Posterior chamber intraocular lens Posterior chamber intraocular lens          IMAGING AND PROCEDURES  Imaging and Procedures for 05/06/20           ASSESSMENT/PLAN:  No problem-specific Assessment & Plan  notes found for this encounter.      ICD-10-CM   1. 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 Round hole of right eye  H33.321   3. Severe nonproliferative diabetic retinopathy of left eye associated with type 2 diabetes mellitus, macular edema presence unspecified (HCC)  L46.5035 Color Fundus Photography [facility] - OU - Both Eyes  4. Retinal hemorrhage of left eye  H35.62     1.OD, will plan peripheral PRP anteriorly in a month or so  2.  3.  Ophthalmic Meds Ordered this visit:  No orders of the defined types were placed in this encounter.      No follow-ups on file.  There are no Patient Instructions on file for this visit.   Explained the diagnoses, plan, and follow up with the patient and they expressed understanding.  Patient expressed understanding of the importance of proper follow up care.   Alford Highland Deette Revak M.D. Diseases & Surgery of the Retina and Vitreous [facility] 05/06/20     Abbreviations: M myopia (nearsighted); A astigmatism; H hyperopia (farsighted); P presbyopia; Mrx spectacle prescription;  CTL contact lenses; OD right eye; OS left eye; OU both eyes  XT exotropia; ET esotropia; PEK punctate epithelial keratitis; PEE punctate epithelial erosions; DES dry eye syndrome; MGD meibomian gland dysfunction; ATs artificial tears; PFAT's preservative free artificial tears; NSC nuclear sclerotic cataract; PSC posterior subcapsular cataract; ERM epi-retinal membrane; PVD posterior vitreous detachment; RD retinal detachment; DM diabetes mellitus; DR diabetic retinopathy; NPDR non-proliferative diabetic retinopathy; PDR proliferative diabetic retinopathy; CSME clinically significant macular edema; DME diabetic macular edema; dbh dot blot hemorrhages; CWS cotton wool spot; POAG primary open angle glaucoma; C/D cup-to-disc ratio; HVF humphrey visual field; GVF goldmann visual  field; OCT optical coherence tomography; IOP intraocular pressure; BRVO Branch retinal vein occlusion; CRVO central retinal vein occlusion; CRAO central retinal artery occlusion; BRAO branch retinal artery occlusion; RT retinal tear; SB scleral buckle; PPV pars plana vitrectomy; VH Vitreous hemorrhage; PRP panretinal laser photocoagulation; IVK intravitreal kenalog; VMT vitreomacular traction; MH Macular hole;  NVD neovascularization of the disc; NVE neovascularization elsewhere; AREDS age related eye disease study; ARMD age related macular degeneration; POAG primary open angle glaucoma; EBMD epithelial/anterior basement membrane dystrophy; ACIOL anterior chamber intraocular lens; IOL intraocular lens; PCIOL posterior chamber intraocular lens; Phaco/IOL phacoemulsification with intraocular lens placement; PRK photorefractive keratectomy; LASIK laser assisted in situ keratomileusis; HTN hypertension; DM diabetes mellitus; COPD chronic obstructive pulmonary disease

## 2020-05-18 ENCOUNTER — Encounter: Payer: Self-pay | Admitting: Physician Assistant

## 2020-05-18 DIAGNOSIS — I129 Hypertensive chronic kidney disease with stage 1 through stage 4 chronic kidney disease, or unspecified chronic kidney disease: Secondary | ICD-10-CM | POA: Insufficient documentation

## 2020-05-20 ENCOUNTER — Telehealth: Payer: Self-pay | Admitting: Pharmacy Technician

## 2020-05-20 NOTE — Telephone Encounter (Signed)
Received fax from [facility], they referred patient to Manufacturer Assistance for financial assistance for Humira . Called patient to follow up, had to leave a message  Mailed [employer] Ass[employer] application to patient.

## 2020-05-28 ENCOUNTER — Telehealth: Payer: Self-pay | Admitting: *Deleted

## 2020-05-28 NOTE — Telephone Encounter (Signed)
Results received from [facility]  [facility] on 05/18/2020 Reviewed by Sherron Ales, PA-C  CBC/CMP, Vitamin D  Glucose 187 BUN 24 EOS 0.5 Vitamin D 35.3  Patient on Humira 40 mg every 14 days.

## 2020-06-07 ENCOUNTER — Encounter (INDEPENDENT_AMBULATORY_CARE_PROVIDER_SITE_OTHER): Payer: Medicare Other | Admitting: Ophthalmology

## 2020-06-09 ENCOUNTER — Encounter (INDEPENDENT_AMBULATORY_CARE_PROVIDER_SITE_OTHER): Payer: Medicare Other | Admitting: Ophthalmology

## 2020-06-23 ENCOUNTER — Other Ambulatory Visit: Payer: Self-pay

## 2020-06-23 ENCOUNTER — Encounter (INDEPENDENT_AMBULATORY_CARE_PROVIDER_SITE_OTHER): Payer: Self-pay | Admitting: Ophthalmology

## 2020-06-23 ENCOUNTER — Ambulatory Visit (INDEPENDENT_AMBULATORY_CARE_PROVIDER_SITE_OTHER): Payer: Medicare Other | Admitting: Ophthalmology

## 2020-06-23 ENCOUNTER — Other Ambulatory Visit: Payer: Self-pay | Admitting: Rheumatology

## 2020-06-23 DIAGNOSIS — E113491 Type 2 diabetes mellitus with severe nonproliferative diabetic retinopathy without macular edema, right eye: Secondary | ICD-10-CM

## 2020-06-23 DIAGNOSIS — L4050 Arthropathic psoriasis, unspecified: Secondary | ICD-10-CM

## 2020-06-23 NOTE — Assessment & Plan Note (Addendum)
The nature of severe nonproliferative diabetic retinopathy discussed with the patient as well as the need for more frequent follow up and likely progression to proliferative disease in the near future. The options of continued observation versus panretinal photocoagulation at this time were reviewed as well as the risks, benefits, and alternatives. More recent option includes the use of ocular injectable medications to slow progression of retinal disease. Tight control of glucose, blood pressure, and serum lipid levels were recommended under the direction of general physician or endocrinologist, as well as avoidance of smoking and maintenance of normal body weight. The 2-year risk of progression to proliferative diabetic retinopathy is 60%.  PRP #1 OD completed today of far temporal periphery regions of nonperfusion, watershed area

## 2020-06-23 NOTE — Telephone Encounter (Addendum)
Last Visit: 03/31/2020 Next Visit: 09/02/2020 Labs: 05/18/2020  Glucose 187, BUN 24, EOS 0.5 TB Gold: 07/01/2019 Neg   Current Dose per office note on 03/31/2020: Humira 40 mg subcutaneous injections every 14 days.  Patient advised she is due to update her TB Gold. Patient reminded of lab hours.   Okay to refill Humira?

## 2020-06-23 NOTE — Progress Notes (Signed)
06/23/2020     CHIEF COMPLAINT Patient presents for Retina Follow Up   HISTORY OF PRESENT ILLNESS: Margaret Hobbs is a 68 y.o. female who presents to the clinic today for:   HPI    Retina Follow Up    Patient presents with  Diabetic Retinopathy.  In right eye.  This started 1 month ago.  Severity is mild.  Duration of 1 month.  Since onset it is stable.          Comments    1 Month PRP OD  Pt denies noticeable changes to [place] OU since last visit. Pt denies ocular pain, flashes of light, or floaters OU.  LBS: does not check       Last edited by Ileana Roup, COA on 06/23/2020 10:15 AM. (History)      Referring physician: Martha Clan, MD [address] [place],  [place] [address]  HISTORICAL INFORMATION:   Selected notes from the MEDICAL RECORD NUMBER    Lab Results  Component Value Date   HGBA1C  06/02/2007    5.6 (NOTE)   The ADA recommends the following therapeutic goals for glycemic   control related to Hgb A1C measurement:   Goal of Therapy:   < 7.0% Hgb A1C   Action Suggested:  > 8.0% Hgb A1C   Ref:  Diabetes Care, 22, Suppl. 1, 1999     CURRENT MEDICATIONS: No current outpatient medications on file. (Ophthalmic Drugs)   No current facility-administered medications for this visit. (Ophthalmic Drugs)   Current Outpatient Medications (Other)  Medication Sig  . HUMIRA 40 MG/0.4ML PSKT INJECT 40MG  SUBCUTANEOUSLY  EVERY OTHER WEEK  . amoxicillin (AMOXIL) 500 MG capsule Take 2,000 mg by mouth as directed. Takes 1 prior before dental procedures  . Ascorbic Acid (VITAMIN C) 500 MG tablet Take 500 mg by mouth daily.    aspirin 81 MG tablet Take 81 mg by mouth daily.    . cholecalciferol (VITAMIN D) 1000 UNITS tablet Take 2,000 Units by mouth daily.  . cilostazol (PLETAL) 50 MG tablet Take 1 tablet (50 mg total) by mouth 2 (two) times daily.  . clopidogrel (PLAVIX) 75 MG tablet TAKE 1 TABLET BY MOUTH  DAILY  . co-enzyme Q-10 30 MG capsule Take 30 mg by  mouth daily.   . cyanocobalamin 1000 MCG tablet Take 1,000 mcg by mouth daily.   . folic acid (FOLVITE) 1 MG tablet TAKE 2 TABLETS (2MG ) BY  MOUTH EVERY MORNING (Patient taking differently: Take 1 mg by mouth daily. )  . glipiZIDE-metformin (METAGLIP) 5-500 MG per tablet Take 1 tablet by mouth 2 (two) times daily before a meal.    . hydrALAZINE (APRESOLINE) 50 MG tablet Take 1 tablet (50 mg total) by mouth in the morning and at bedtime.  . metoprolol succinate (TOPROL-XL) 100 MG 24 hr tablet Take 1 tablet (100 mg total) by mouth daily.  . nitroGLYCERIN (NITROSTAT) 0.4 MG SL tablet PLACE 1 TABLET UNDER THE TONGUE EVERY 5 (FIVE) MINUTES AS NEEDED FOR CHEST PAIN (3 DOSES MAX)  . olmesartan-hydrochlorothiazide (BENICAR HCT) 40-25 MG tablet Take 1 tablet by mouth daily.   . potassium chloride 20 MEQ/15ML (10%) SOLN 20 mEq daily.   . ramipril (ALTACE) 10 MG capsule   . rosuvastatin (CRESTOR) 20 MG tablet Take 20 mg by mouth daily.   . simvastatin (ZOCOR) 40 MG tablet Take 40 mg by mouth at bedtime.  . vitamin A [name] UNIT capsule Take 10,000 Units by mouth daily.     vitamin E 400 UNIT capsule Take 400 Units by mouth daily.    . zoledronic acid (RECLAST) 5 MG/100ML SOLN injection Inject 5 mg into the vein once.    No current facility-administered medications for this visit. (Other)      REVIEW OF SYSTEMS:    ALLERGIES Allergies  Allergen Reactions  . Remicade [Infliximab] Other (See Comments)    Convulsion and felt cold   . Sulfa Antibiotics Rash    PAST MEDICAL HISTORY Past Medical History:  Diagnosis Date  . Aphasia   . Arthritis   . CAD (coronary artery disease)   . Cerebrovascular disease   . CVA (cerebral vascular accident) (HCC)   . Depressed   . Diabetes mellitus   . DM2 (diabetes mellitus, type 2) (HCC)   . HLD (hyperlipidemia)   . HTN (hypertension)   . Hypercholesteremia   . Hyperparathyroidism   . MI (myocardial infarction) (HCC)   . Osteoporosis   . TIA  (transient ischemic attack)   . UTI (urinary tract infection)    Past Surgical History:  Procedure Laterality Date  . ABDOMINAL HYSTERECTOMY    . CATARACT EXTRACTION W/PHACO Bilateral 2017   Dr. Alben Spittle  . hysterectomy - unknown type      FAMILY HISTORY Family History  Problem Relation Age of Onset  . Heart disease Mother        pacemaker  . Hypertension Mother   . Hyperlipidemia Mother   . Diabetes Brother        pre-diabetic   . Hypertension Son   . Sleep apnea Son   . Diabetes Other   . Hypertension Other   . Alcohol abuse Other   . Coronary artery disease Other     SOCIAL HISTORY Social History   Tobacco Use  . Smoking status: Never Smoker  . Smokeless tobacco: Never Used  Vaping Use  . Vaping Use: Never used  Substance Use Topics  . Alcohol use: No  . Drug use: Never         OPHTHALMIC EXAM:  Base Eye Exam    Visual Acuity (ETDRS)      Right Left   Dist cc 20/20 -1 20/20   Correction: Glasses       Tonometry (Tonopen, 10:18 AM)      Right Left   Pressure 12 12       Pupils      Pupils Dark Light Shape React APD   Right PERRL 3 2 Round Brisk None   Left PERRL 3 2 Round Brisk None       Visual Fields (Counting fingers)      Left Right    Full Full       Extraocular Movement      Right Left    Full Full       Neuro/Psych    Oriented x3: Yes   Mood/Affect: Normal       Dilation    Right eye: 1.0% Mydriacyl, 2.5% Phenylephrine @ 10:18 AM          IMAGING AND PROCEDURES  Imaging and Procedures for 06/23/20  Panretinal Photocoagulation - OD - Right Eye       Time Out Confirmed correct patient, procedure, site, and patient consented.   Anesthesia Topical anesthesia was used. Anesthetic medications included Proparacaine 0.5%.   Laser Information The type of laser was diode. Color was yellow. The duration in seconds was 0.02. The spot size was 390 microns. Laser power was 230. Total spots  was 463.   Post-op The patient  tolerated the procedure well. There were no complications. The patient received written and verbal post procedure care education.                 ASSESSMENT/PLAN:  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) The nature of severe nonproliferative diabetic retinopathy discussed with the patient as well as the need for more frequent follow up and likely progression to proliferative disease in the near future. The options of continued observation versus panretinal photocoagulation at this time were reviewed as well as the risks, benefits, and alternatives. More recent option includes the use of ocular injectable medications to slow progression of retinal disease. Tight control of glucose, blood pressure, and serum lipid levels were recommended under the direction of general physician or endocrinologist, as well as avoidance of smoking and maintenance of normal body weight. The 2-year risk of progression to proliferative diabetic retinopathy is 60%.        ICD-10-CM   1. 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  PRP OD completed today    2.  OS will monitor severe NPDR, may deliver similar local PRP in the future.  3.  Ophthalmic Meds Ordered this visit:  No orders of the defined types were placed in this encounter.      Return in about 4 months (around 10/23/2020) for DILATE OU, COLOR FP.  There are no Patient Instructions on file for this visit.   Explained the diagnoses, plan, and follow up with the patient and they expressed understanding.  Patient expressed understanding of the importance of proper follow up care.   Alford Highland Demetric Dunnaway M.D. Diseases & Surgery of the Retina and Vitreous [facility] 06/23/20     Abbreviations: M myopia (nearsighted); A  astigmatism; H hyperopia (farsighted); P presbyopia; Mrx spectacle prescription;  CTL contact lenses; OD right eye; OS left eye; OU both eyes  XT exotropia; ET esotropia; PEK punctate epithelial keratitis; PEE punctate epithelial erosions; DES dry eye syndrome; MGD meibomian gland dysfunction; ATs artificial tears; PFAT's preservative free artificial tears; NSC nuclear sclerotic cataract; PSC posterior subcapsular cataract; ERM epi-retinal membrane; PVD posterior vitreous detachment; RD retinal detachment; DM diabetes mellitus; DR diabetic retinopathy; NPDR non-proliferative diabetic retinopathy; PDR proliferative diabetic retinopathy; CSME clinically significant macular edema; DME diabetic macular edema; dbh dot blot hemorrhages; CWS cotton wool spot; POAG primary open angle glaucoma; C/D cup-to-disc ratio; HVF humphrey visual field; GVF goldmann visual field; OCT optical coherence tomography; IOP intraocular pressure; BRVO Branch retinal vein occlusion; CRVO central retinal vein occlusion; CRAO central retinal artery occlusion; BRAO branch retinal artery occlusion; RT retinal tear; SB scleral buckle; PPV pars plana vitrectomy; VH Vitreous hemorrhage; PRP panretinal laser photocoagulation; IVK intravitreal kenalog; VMT vitreomacular traction; MH Macular hole;  NVD neovascularization of the disc; NVE neovascularization elsewhere; AREDS age related eye disease study; ARMD age related macular degeneration; POAG primary open angle glaucoma; EBMD epithelial/anterior basement membrane dystrophy; ACIOL anterior chamber intraocular lens; IOL intraocular lens; PCIOL posterior chamber intraocular lens; Phaco/IOL phacoemulsification with intraocular lens placement; PRK photorefractive keratectomy; LASIK laser assisted in situ keratomileusis; HTN hypertension; DM diabetes mellitus; COPD chronic obstructive pulmonary disease

## 2020-06-25 ENCOUNTER — Other Ambulatory Visit: Payer: Self-pay

## 2020-06-25 DIAGNOSIS — Z79899 Other long term (current) drug therapy: Secondary | ICD-10-CM

## 2020-06-26 LAB — QUANTIFERON-TB GOLD PLUS
Mitogen-NIL: >10 IU/mL
NIL: 0.03 IU/mL
QuantiFERON-TB Gold Plus: NEGATIVE
TB1-NIL: <0 IU/mL
TB2-NIL: 0 IU/mL

## 2020-06-29 NOTE — Progress Notes (Signed)
TB gold negative

## 2020-08-09 ENCOUNTER — Other Ambulatory Visit: Payer: Self-pay | Admitting: *Deleted

## 2020-08-09 DIAGNOSIS — L4050 Arthropathic psoriasis, unspecified: Secondary | ICD-10-CM

## 2020-08-09 MED ORDER — HUMIRA (2 SYRINGE) 40 MG/0.4ML ~~LOC~~ PSKT: PREFILLED_SYRINGE | SUBCUTANEOUS | 0 refills | Status: DC

## 2020-08-09 NOTE — Telephone Encounter (Signed)
TB gold negative on 06/25/20.  Ok to refill Humira.

## 2020-08-09 NOTE — Telephone Encounter (Signed)
Refill request received for Humira  Last Visit: 03/31/2020 Next Visit: 09/02/2020 Labs: 05/18/2020  Glucose 187, BUN 24, EOS 0.5 TB Gold: 07/01/2019 Neg   Current Dose per office note on 06/25/2020: Humira 40 mg subcutaneous injections every 14 days.  Okay to refill Humira?

## 2020-08-11 ENCOUNTER — Telehealth: Payer: Self-pay | Admitting: Rheumatology

## 2020-08-11 NOTE — Telephone Encounter (Signed)
Patient called stating she just received an email from [facility] stating they have not received refill request from Dr. Corliss Skains.  Patient is requesting a return call.

## 2020-08-11 NOTE — Telephone Encounter (Signed)
Patient advised her prescription was sent to the pharmacy on 08/09/2020.

## 2020-08-19 NOTE — Progress Notes (Signed)
Office Visit Note  Patient: Margaret Hobbs             Date of Birth: 1951-05-10           MRN: [id number]             PCP: Martha Clan, MD Referring: Martha Clan, MD Visit Date: 09/02/2020 Occupation: @GUAROCC @  Subjective:  Right hand pain and psoriasis  History of Present Illness: Adonis Ryther is a 69 y.o. female with history of psoriatic arthritis, psoriasis and osteoarthritis.  She states she has been doing quite well on Humira every other week.  She states she has occasional discomfort in her right second MCP joint.  She also has psoriasis in her ears.  She uses some topical agents.  She has been followed by her PCP for osteoporosis.  She states the Reclast is on hold right now.  Activities of Daily Living:  Patient reports morning stiffness for a few  minutes.   Patient Denies nocturnal pain.  Difficulty dressing/grooming: Reports Difficulty climbing stairs: Reports Difficulty getting out of chair: Reports Difficulty using hands for taps, buttons, cutlery, and/or writing: Denies  Review of Systems  Constitutional: Negative for fatigue.  HENT: Negative for mouth sores, mouth dryness and nose dryness.   Eyes: Negative for itching and dryness.  Respiratory: Negative for shortness of breath and difficulty breathing.   Cardiovascular: Negative for chest pain and palpitations.  Gastrointestinal: Negative for blood in stool, constipation and diarrhea.  Endocrine: Negative for increased urination.  Genitourinary: Negative for difficulty urinating.  Musculoskeletal: Positive for myalgias, morning stiffness and myalgias. Negative for arthralgias, joint pain, joint swelling and muscle tenderness.  Skin: Positive for rash. Negative for color change.  Allergic/Immunologic: Negative for susceptible to infections.  Neurological: Negative for dizziness, numbness, headaches, memory loss and weakness.  Hematological: Positive for bruising/bleeding tendency.    Psychiatric/Behavioral: Negative for confusion.    PMFS History:  Patient Active Problem List   Diagnosis Date Noted  . 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 Round hole of right eye 05/06/2020  . Severe nonproliferative diabetic retinopathy of left eye (HCC) 05/06/2020  . Retinal hemorrhage of left eye 05/06/2020  . Peripheral arterial disease (HCC) 10/17/2019  . Carotid artery disease (HCC) 01/24/2017  . Primary osteoarthritis of both knees 11/29/2016  . Other psoriasis 11/28/2016  . Psoriatic arthritis (HCC) 11/28/2016  . High risk medication use 11/28/2016  . Type 2 diabetes mellitus (HCC) 03/30/2009  . HYPERCHOLESTEROLEMIA 03/30/2009  . Dyslipidemia 03/30/2009  . Essential hypertension 03/30/2009  . MYOCARDIAL INFARCTION 03/30/2009  . Coronary artery disease involving native coronary artery of native heart without angina pectoris 03/30/2009  . Cerebral artery occlusion with cerebral infarction (HCC) 03/30/2009  . URINARY TRACT INFECTION 03/30/2009  . ARTHRITIS 03/30/2009  . HYPERPARATHYROIDISM, HX OF 03/30/2009    Past Medical History:  Diagnosis Date  . Aphasia   . Arthritis   . CAD (coronary artery disease)   . Cerebrovascular disease   . CVA (cerebral vascular accident) (HCC)   . Depressed   . Diabetes mellitus   . DM2 (diabetes mellitus, type 2) (HCC)   . HLD (hyperlipidemia)   . HTN (hypertension)   . Hypercholesteremia   . Hyperparathyroidism   . MI (myocardial infarction) (HCC)   . Osteoporosis   . TIA (transient ischemic attack)   . UTI (urinary tract infection)     Family History  Problem Relation Age of Onset  . Heart disease Mother  pacemaker  . Hypertension Mother   . Hyperlipidemia Mother   . Diabetes Brother        pre-diabetic   . Hypertension Son   . Sleep apnea Son   . Diabetes Other   . Hypertension Other   . Alcohol abuse Other   . Coronary artery disease Other    Past Surgical History:   Procedure Laterality Date  . ABDOMINAL HYSTERECTOMY    . CATARACT EXTRACTION W/PHACO Bilateral 2017   Dr. Alben Spittle  . hysterectomy - unknown type     Social History   Social History Narrative  . Not on file   Immunization History  Administered Date(s) Administered  . PFIZER SARS-COV-2 Vaccination 02/16/2020, 03/08/2020     Objective: Vital Signs: BP (!) 93/55 (BP Location: Left Arm, Patient Position: Sitting, Cuff Size: Normal)   Pulse 86   Resp 16   Ht 5\' 2"  (1.575 m)   Wt 204 lb 6.4 oz (92.7 kg)   BMI 37.39 kg/m    Physical Exam Vitals and nursing note reviewed.  Constitutional:      Appearance: She is well-developed.  HENT:     Head: Normocephalic and atraumatic.  Eyes:     Conjunctiva/sclera: Conjunctivae normal.  Cardiovascular:     Rate and Rhythm: Normal rate and regular rhythm.     Heart sounds: Normal heart sounds.  Pulmonary:     Effort: Pulmonary effort is normal.     Breath sounds: Normal breath sounds.  Abdominal:     General: Bowel sounds are normal.     Palpations: Abdomen is soft.  Musculoskeletal:     Cervical back: Normal range of motion.  Lymphadenopathy:     Cervical: No cervical adenopathy.  Skin:    General: Skin is warm and dry.     Capillary Refill: Capillary refill takes less than 2 seconds.  Neurological:     Mental Status: She is alert and oriented to person, place, and time.     Comments: Hemiparesis due to stroke.  Psychiatric:        Behavior: Behavior normal.      Musculoskeletal Exam: C-spine was in good range of motion.  She has some limitation range of motion of her shoulder joints.  Elbow joints and wrist joints with good range of motion.  She described tenderness over her right second MCP but no synovitis was noted.  Hip joints and knee joints with good range of motion.  No tenderness was noted over MTP joints.  CDAI Exam: CDAI Score: -- Patient Global: --; Provider Global: -- Swollen: --; Tender: -- Joint Exam  09/02/2020   No joint exam has been documented for this visit   There is currently no information documented on the homunculus. Go to the Rheumatology activity and complete the homunculus joint exam.  Investigation: No additional findings.  Imaging: No results found.  Recent Labs: Lab Results  Component Value Date   WBC 7.1 07/01/2019   HGB 12.8 07/01/2019   PLT 250 07/01/2019   NA 139 07/01/2019   K 3.9 07/01/2019   CL 101 07/01/2019   CO2 30 07/01/2019   GLUCOSE 129 (H) 07/01/2019   BUN 27 (H) 07/01/2019   CREATININE 0.99 07/01/2019   BILITOT 0.4 07/01/2019   ALKPHOS 64 08/01/2016   AST 22 07/01/2019   ALT 19 07/01/2019   PROT 7.3 07/01/2019   ALBUMIN 3.1 (L) 08/01/2016   CALCIUM 9.7 07/01/2019   GFRAA 69 07/01/2019   QFTBGOLDPLUS NEGATIVE 06/25/2020  Speciality Comments: No specialty comments available.  Procedures:  No procedures performed Allergies: Remicade [infliximab] and Sulfa antibiotics   Assessment / Plan:     Visit Diagnoses: Psoriatic arthritis (HCC)-she had no synovitis on examination.  She continues to have some arthralgias.  She has been tolerating Humira well and has been taking it every other week.  Other psoriasis-she has few psoriasis scales in her ear canal and behind her ears.  She has been using over-the-counter topical agents which works for her.  She does not want any other prescriptions.  High risk medication use - Humira 40 mg sq injections every 14 days.  Her labs have been stable.  Last labs from July were reviewed.  TB gold was negative on June 25, 2020.  Have advised her to get labs every 3 months to monitor for drug toxicity.  Primary osteoarthritis of both knees-she is not having much discomfort.  Joint protection muscle strengthening was discussed.  Age-related osteoporosis without current pathological fracture - She is on Reclast IV infusion yearly prescribed by Dr. Clelia Croft.  She states that Reclast is on hold currently.  Dietary  intake of calcium was discussed.  Other medical problems are listed as follows:  History of coronary artery disease-increased association of heart disease with psoriatic arthritis was also emphasized.  Dietary modifications were discussed.  History of diabetes mellitus  History of stroke  History of hypercholesterolemia  Dyslipidemia  History of hypertension  Educated about COVID-19 virus infection-she is fully vaccinated against COVID-19.  She is eligible to receive a booster.  Instructions were given and information was placed in the AVS.  Orders: No orders of the defined types were placed in this encounter.  No orders of the defined types were placed in this encounter.     Follow-Up Instructions: Return in about 5 months (around 02/02/2021) for Psoriatic arthritis, Osteoarthritis, Osteoporosis.   Pollyann Savoy, MD  Note - This record has been created using Animal nutritionist.  Chart creation errors have been sought, but may not always  have been located. Such creation errors do not reflect on  the standard of medical care.

## 2020-08-25 ENCOUNTER — Ambulatory Visit: Payer: Medicare Other | Admitting: Cardiovascular Disease

## 2020-09-02 ENCOUNTER — Encounter: Payer: Self-pay | Admitting: Rheumatology

## 2020-09-02 ENCOUNTER — Other Ambulatory Visit: Payer: Self-pay

## 2020-09-02 ENCOUNTER — Ambulatory Visit: Payer: Medicare Other | Admitting: Rheumatology

## 2020-09-02 VITALS — BP 93/55 | HR 86 | Resp 16 | Ht 62.0 in | Wt 204.4 lb

## 2020-09-02 DIAGNOSIS — Z79899 Other long term (current) drug therapy: Secondary | ICD-10-CM | POA: Diagnosis not present

## 2020-09-02 DIAGNOSIS — Z8679 Personal history of other diseases of the circulatory system: Secondary | ICD-10-CM

## 2020-09-02 DIAGNOSIS — L4050 Arthropathic psoriasis, unspecified: Secondary | ICD-10-CM | POA: Diagnosis not present

## 2020-09-02 DIAGNOSIS — Z8639 Personal history of other endocrine, nutritional and metabolic disease: Secondary | ICD-10-CM

## 2020-09-02 DIAGNOSIS — L408 Other psoriasis: Secondary | ICD-10-CM | POA: Diagnosis not present

## 2020-09-02 DIAGNOSIS — Z7189 Other specified counseling: Secondary | ICD-10-CM

## 2020-09-02 DIAGNOSIS — E785 Hyperlipidemia, unspecified: Secondary | ICD-10-CM

## 2020-09-02 DIAGNOSIS — M810 Age-related osteoporosis without current pathological fracture: Secondary | ICD-10-CM

## 2020-09-02 DIAGNOSIS — M170 Bilateral primary osteoarthritis of knee: Secondary | ICD-10-CM

## 2020-09-02 DIAGNOSIS — Z8673 Personal history of transient ischemic attack (TIA), and cerebral infarction without residual deficits: Secondary | ICD-10-CM

## 2020-09-02 NOTE — Patient Instructions (Signed)
Standing Labs We placed an order today for your standing lab work.   Please have your standing labs drawn in October and every 3 months   If possible, please have your labs drawn 2 weeks prior to your appointment so that the provider can discuss your results at your appointment.  We have open lab daily Monday through Thursday from 8:30-12:30 PM and 1:30-4:30 PM and Friday from 8:30-12:30 PM and 1:30-4:00 PM at the office of Dr. Baby Stairs, [facility].   Please be advised, patients with office appointments requiring lab work will take precedents over walk-in lab work.  If possible, please come for your lab work on Monday and Friday afternoons, as you may experience shorter wait times. The office is located at [address], [address], [place], [place] [address] No appointment is necessary.   Labs are drawn by [facility]. Please bring your co-pay at the time of your lab draw.  You may receive a bill from [facility] for your lab work.  If you wish to have your labs drawn at another location, please call the office 24 hours in advance to send orders.  If you have any questions regarding directions or hours of operation,  please call [phone].   As a reminder, please drink plenty of water prior to coming for your lab work. Thanks!  COVID-19 vaccine recommendations:   COVID-19 vaccine is recommended for everyone (unless you are allergic to a vaccine component), even if you are on a medication that suppresses your immune system.   If you are on Methotrexate, Cellcept (mycophenolate), Rinvoq, Xeljanz, and Olumiant- hold the medication for 1 week after each vaccine. Hold Methotrexate for 2 weeks after the single dose COVID-19 vaccine.   If you are on Orencia subcutaneous injection - hold medication one week prior to and one week after the first COVID-19 vaccine dose (only).   If you are on Orencia IV infusions- time vaccination administration so that the first COVID-19 vaccination  will occur four weeks after the infusion and postpone the subsequent infusion by one week.   If you are on Cyclophosphamide or Rituxan infusions please contact your doctor prior to receiving the COVID-19 vaccine.   Do not take Tylenol or any anti-inflammatory medications (NSAIDs) 24 hours prior to the COVID-19 vaccination.   There is no direct evidence about the efficacy of the COVID-19 vaccine in individuals who are on medications that suppress the immune system.   Even if you are fully vaccinated, and you are on any medications that suppress your immune system, please continue to wear a mask, maintain at least six feet social distance and practice hand hygiene.   If you develop a COVID-19 infection, please contact your PCP or our office to determine if you need antibody infusion.  The booster vaccine is now available for immunocompromised patients. It is advised that if you had Pfizer vaccine you should get Pfizer booster.  If you had a Moderna vaccine then you should get a Moderna booster. Johnson and Johnson does not have a booster vaccine at this time.  Please see the following web sites for updated information.   [email or web address]://[email or web address]/[email or web address]-Vaccination-Patient-Resources.[email or web address]  [email or web address]://[email or web address]/About-[email or web address]-Releases/ID/1159   

## 2020-09-20 ENCOUNTER — Other Ambulatory Visit: Payer: Self-pay | Admitting: Rheumatology

## 2020-09-30 ENCOUNTER — Telehealth: Payer: Self-pay | Admitting: Cardiovascular Disease

## 2020-09-30 NOTE — Telephone Encounter (Signed)
Called patient to schedule follow up with Berry from recall list, no answer and unable to LVM 

## 2020-10-13 ENCOUNTER — Telehealth: Payer: Self-pay

## 2020-10-13 ENCOUNTER — Other Ambulatory Visit: Payer: Self-pay | Admitting: Physician Assistant

## 2020-10-13 DIAGNOSIS — L4050 Arthropathic psoriasis, unspecified: Secondary | ICD-10-CM

## 2020-10-13 NOTE — Telephone Encounter (Signed)
Attempted to contact patient, LVM asking patient to request lab results be sent from PCP to our office.

## 2020-10-13 NOTE — Telephone Encounter (Addendum)
Last Visit: 09/02/2020 Next Visit: 01/06/2021 Labs: 5/25/2021Glucose 187,BUN 24,EOS 0.5 TB Gold: 06/25/2020 Neg  Current Dose per office note 09/02/2020: Humira 40 mg sq injections every 14 days  DX: Psoriatic arthritis   Patient advised she is due to update labs. Patient states she recently had them done at her PCP's office and will call to have them sent to our office.  Okay to refill Humira?

## 2020-10-13 NOTE — Telephone Encounter (Signed)
Patient called stating she had labwork at her appointment with Dr. Clelia Croft and they will be faxing a copy of the results to the office today 10/13/20.  Patient requested a return call to confirm they have been received.

## 2020-10-25 ENCOUNTER — Encounter (INDEPENDENT_AMBULATORY_CARE_PROVIDER_SITE_OTHER): Payer: Medicare Other | Admitting: Ophthalmology

## 2020-10-25 ENCOUNTER — Encounter (INDEPENDENT_AMBULATORY_CARE_PROVIDER_SITE_OTHER): Payer: Self-pay | Admitting: Ophthalmology

## 2020-10-25 ENCOUNTER — Other Ambulatory Visit: Payer: Self-pay

## 2020-10-25 ENCOUNTER — Ambulatory Visit (INDEPENDENT_AMBULATORY_CARE_PROVIDER_SITE_OTHER): Payer: Medicare Other | Admitting: Ophthalmology

## 2020-10-25 DIAGNOSIS — E113491 Type 2 diabetes mellitus with severe nonproliferative diabetic retinopathy without macular edema, right eye: Secondary | ICD-10-CM

## 2020-10-25 DIAGNOSIS — E113492 Type 2 diabetes mellitus with severe nonproliferative diabetic retinopathy without macular edema, left eye: Secondary | ICD-10-CM

## 2020-10-25 NOTE — Assessment & Plan Note (Signed)
OD stable post PRP temporal window

## 2020-10-25 NOTE — Progress Notes (Addendum)
10/25/2020     CHIEF COMPLAINT Patient presents for Retina Follow Up   HISTORY OF PRESENT ILLNESS: Margaret Hobbs is a 69 y.o. female who presents to the clinic today for:   HPI    Retina Follow Up    Patient presents with  Diabetic Retinopathy.  In right eye.  Severity is severe.  Duration of 4 months.  Since onset it is stable.  I, the attending physician,  performed the HPI with the patient and updated documentation appropriately.          Comments    4 Month NPDR f\u. FP  Pt states she has had a slight gls rx change. Denies any complaints. UXL:KGMW not check.       Last edited by Elyse Jarvis on 10/25/2020 10:30 AM. (History)      Referring physician: [place] [address] [place],  [place] [address]  HISTORICAL INFORMATION:   Selected notes from the MEDICAL RECORD NUMBER    Lab Results  Component Value Date   HGBA1C  06/02/2007    5.6 (NOTE)   The ADA recommends the following therapeutic goals for glycemic   control related to Hgb A1C measurement:   Goal of Therapy:   < 7.0% Hgb A1C   Action Suggested:  > 8.0% Hgb A1C   Ref:  Diabetes Care, 22, Suppl. 1, 1999     CURRENT MEDICATIONS: No current outpatient medications on file. (Ophthalmic Drugs)   No current facility-administered medications for this visit. (Ophthalmic Drugs)   Current Outpatient Medications (Other)  Medication Sig  . amoxicillin (AMOXIL) 500 MG capsule Take 2,000 mg by mouth as directed. Takes 1 prior before dental procedures  . Ascorbic Acid (VITAMIN C) 500 MG tablet Take 500 mg by mouth daily.    [name] aspirin 81 MG tablet Take 81 mg by mouth daily.    . cholecalciferol (VITAMIN D) 1000 UNITS tablet Take 2,000 Units by mouth daily.  . cilostazol (PLETAL) 50 MG tablet Take 1 tablet (50 mg total) by mouth 2 (two) times daily.  . clopidogrel (PLAVIX) 75 MG tablet TAKE 1 TABLET BY MOUTH  DAILY  . co-enzyme Q-10 30 MG capsule Take 30 mg by mouth daily.   . folic acid  (FOLVITE) 1 MG tablet TAKE 2 TABLETS (2MG ) BY  MOUTH IN THE MORNING  . glipiZIDE-metformin (METAGLIP) 5-500 MG per tablet Take 1 tablet by mouth 2 (two) times daily before a meal.    . HUMIRA 40 MG/0.4ML PSKT INJECT 40MG  SUBCUTANEOUSLY  EVERY OTHER WEEK  . hydrALAZINE (APRESOLINE) 50 MG tablet Take 1 tablet (50 mg total) by mouth in the morning and at bedtime.  . metoprolol succinate (TOPROL-XL) 100 MG 24 hr tablet Take 1 tablet (100 mg total) by mouth daily.  . nitroGLYCERIN (NITROSTAT) 0.4 MG SL tablet PLACE 1 TABLET UNDER THE TONGUE EVERY 5 (FIVE) MINUTES AS NEEDED FOR CHEST PAIN (3 DOSES MAX)  . olmesartan-hydrochlorothiazide (BENICAR HCT) 40-25 MG tablet Take 1 tablet by mouth daily.   . potassium chloride 20 MEQ/15ML (10%) SOLN 20 mEq daily.   . ramipril (ALTACE) 10 MG capsule   . rosuvastatin (CRESTOR) 20 MG tablet Take 20 mg by mouth daily.   . simvastatin (ZOCOR) 40 MG tablet Take 40 mg by mouth at bedtime.  . vitamin A UNIT capsule Take 10,000 Units by mouth daily.    . vitamin E 400 UNIT capsule Take 400 Units by mouth daily.    . zoledronic acid (  RECLAST) 5 MG/100ML SOLN injection Inject 5 mg into the vein once.  (Patient not taking: Reported on 09/02/2020)   No current facility-administered medications for this visit. (Other)      REVIEW OF SYSTEMS:    ALLERGIES Allergies  Allergen Reactions  . Remicade [Infliximab] Other (See Comments)    Convulsion and felt cold   . Sulfa Antibiotics Rash    PAST MEDICAL HISTORY Past Medical History:  Diagnosis Date  . Aphasia   . Arthritis   . CAD (coronary artery disease)   . Cerebrovascular disease   . CVA (cerebral vascular accident) (HCC)   . Depressed   . Diabetes mellitus   . DM2 (diabetes mellitus, type 2) (HCC)   . HLD (hyperlipidemia)   . HTN (hypertension)   . Hypercholesteremia   . Hyperparathyroidism   . MI (myocardial infarction) (HCC)   . Osteoporosis   . TIA (transient ischemic attack)   . UTI  (urinary tract infection)    Past Surgical History:  Procedure Laterality Date  . ABDOMINAL HYSTERECTOMY    . CATARACT EXTRACTION W/PHACO Bilateral 2017   Dr. Alben Spittle  . hysterectomy - unknown type      FAMILY HISTORY Family History  Problem Relation Age of Onset  . Heart disease Mother        pacemaker  . Hypertension Mother   . Hyperlipidemia Mother   . Diabetes Brother        pre-diabetic   . Hypertension Son   . Sleep apnea Son   . Diabetes Other   . Hypertension Other   . Alcohol abuse Other   . Coronary artery disease Other     SOCIAL HISTORY Social History   Tobacco Use  . Smoking status: Never Smoker  . Smokeless tobacco: Never Used  Vaping Use  . Vaping Use: Never used  Substance Use Topics  . Alcohol use: No  . Drug use: Never         OPHTHALMIC EXAM:  Base Eye Exam    Visual Acuity (Snellen - Linear)      Right Left   Dist cc 20/20 20/20 -1   Correction: Glasses       Tonometry (Tonopen, 10:35 AM)      Right Left   Pressure 15 12       Pupils      Pupils Dark Light Shape React APD   Right PERRL 3 2 Round Brisk None   Left PERRL 3 2 Round Brisk None       Visual Fields (Counting fingers)      Left Right    Full Full       Neuro/Psych    Oriented x3: Yes   Mood/Affect: Normal       Dilation    Both eyes: 1.0% Mydriacyl, 2.5% Phenylephrine @ 10:35 AM        Slit Lamp and Fundus Exam    External Exam      Right Left   External Normal Normal       Slit Lamp Exam      Right Left   Lids/Lashes Normal Normal   Conjunctiva/Sclera White and quiet White and quiet   Cornea Clear Clear   Anterior Chamber Deep and quiet Deep and quiet   Iris Round and reactive Round and reactive   Lens Posterior chamber intraocular lens Posterior chamber intraocular lens   Anterior Vitreous Normal Normal       Fundus Exam      Right  Left   Posterior Vitreous Normal Normal   Disc Normal Normal   C/D Ratio 0.4 0.55   Macula Microaneurysms,  no macular thickening Microaneurysms, no macular thickening   Vessels Severe NPDR, no progression Severe NPDR, no progression   Periphery Good local PRP temporal window, Normal          IMAGING AND PROCEDURES  Imaging and Procedures for 10/25/20  Color Fundus Photography Optos - OU - Both Eyes       Right Eye Progression has improved. Disc findings include normal observations, increased cup to disc ratio. Macula : microaneurysms.   Left Eye Progression has improved. Disc findings include normal observations. Macula : microaneurysms.   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 The options of continued observation versus panretinal photocoagulation at this time were reviewed as well as the risks, benefits, and alternatives. More recent option includes the use of ocular injectable medications to slow progression of retinal disease. Tight control of glucose, blood pressure, and serum lipid levels were recommended under the direction of general physician or endocrinologist, as well as avoidance of smoking and maintenance of normal body weight. The 2-year risk of progression to proliferative diabetic retinopathy is 60%.      ICD-10-CM   1. Severe nonproliferative diabetic retinopathy of right eye without macular edema associated with type 2 diabetes mellitus (HCC)  V85.9292 Color Fundus Photography Optos - OU - Both Eyes  2. Severe nonproliferative diabetic retinopathy of left eye  without macular edema associated with type 2 diabetes mellitus (HCC)  K46.2863     1.  Continue blood sugar control important  2.  No progression of diabetic retinopathy left eye, will extend interval follow-up OU to 6 months  3.  Ophthalmic Meds Ordered this visit:  No orders of the defined types were placed in this encounter.      Return in about 6 months (around 04/24/2021) for DILATE OU, OCT.  There are no Patient Instructions on file for this visit.   Explained the diagnoses, plan, and follow up with the patient and they expressed understanding.  Patient expressed understanding of the importance of proper follow up care.   Alford Highland Candance Bohlman M.D. Diseases & Surgery of the Retina and Vitreous [facility] 10/25/20     Abbreviations: M myopia (nearsighted); A astigmatism; H hyperopia (farsighted); P presbyopia; Mrx spectacle prescription;  CTL contact lenses; OD right eye; OS left eye; OU both eyes  XT exotropia; ET esotropia; PEK punctate epithelial keratitis; PEE punctate epithelial erosions; DES dry eye syndrome; MGD meibomian gland dysfunction; ATs artificial tears; PFAT's preservative free artificial tears; NSC nuclear sclerotic cataract; PSC posterior subcapsular cataract; ERM epi-retinal membrane; PVD posterior vitreous detachment; RD retinal detachment; DM diabetes mellitus; DR diabetic retinopathy; NPDR non-proliferative diabetic retinopathy; PDR proliferative diabetic retinopathy; CSME clinically significant macular edema; DME diabetic macular edema; dbh dot blot hemorrhages; CWS cotton wool spot; POAG primary open angle glaucoma; C/D cup-to-disc ratio; HVF humphrey visual field; GVF  goldmann visual field; OCT optical coherence tomography; IOP intraocular pressure; BRVO Branch retinal vein occlusion; CRVO central retinal vein occlusion; CRAO central retinal artery occlusion; BRAO branch retinal artery occlusion; RT retinal tear; SB scleral buckle; PPV pars plana  vitrectomy; VH Vitreous hemorrhage; PRP panretinal laser photocoagulation; IVK intravitreal kenalog; VMT vitreomacular traction; MH Macular hole;  NVD neovascularization of the disc; NVE neovascularization elsewhere; AREDS age related eye disease study; ARMD age related macular degeneration; POAG primary open angle glaucoma; EBMD epithelial/anterior basement membrane dystrophy; ACIOL anterior chamber intraocular lens; IOL intraocular lens; PCIOL posterior chamber intraocular lens; Phaco/IOL phacoemulsification with intraocular lens placement; PRK photorefractive keratectomy; LASIK laser assisted in situ keratomileusis; HTN hypertension; DM diabetes mellitus; COPD chronic obstructive pulmonary disease 

## 2020-10-25 NOTE — Assessment & Plan Note (Signed)
OS, no active progression, will observeThe nature of severe nonproliferative diabetic retinopathy discussed with the patient as well as the need for more frequent follow up and likely progression to proliferative disease in the near future. The options of continued observation versus panretinal photocoagulation at this time were reviewed as well as the risks, benefits, and alternatives. More recent option includes the use of ocular injectable medications to slow progression of retinal disease. Tight control of glucose, blood pressure, and serum lipid levels were recommended under the direction of general physician or endocrinologist, as well as avoidance of smoking and maintenance of normal body weight. The 2-year risk of progression to proliferative diabetic retinopathy is 60%.

## 2020-11-02 ENCOUNTER — Other Ambulatory Visit: Payer: Self-pay | Admitting: Rheumatology

## 2020-11-02 DIAGNOSIS — L4050 Arthropathic psoriasis, unspecified: Secondary | ICD-10-CM

## 2020-11-02 NOTE — Telephone Encounter (Signed)
Contact PCP's office requesting most recent lab results to be faxed to our office.  Attempted to contact the patient and left message for patient to call the office.

## 2020-11-02 NOTE — Telephone Encounter (Signed)
Last Visit: 09/02/2020 Next Visit: 01/06/2021 Labs:  TB Gold: 06/25/2020 Neg  Current Dose per office note 09/02/2020: Humira 40 mg sq injections every 14 days  DX: Psoriatic arthritis

## 2020-11-03 NOTE — Telephone Encounter (Signed)
Attempted to contact the patient and left message for patient to call the office.  

## 2020-11-26 ENCOUNTER — Other Ambulatory Visit: Payer: Self-pay | Admitting: Rheumatology

## 2020-11-26 ENCOUNTER — Telehealth: Payer: Self-pay

## 2020-11-26 DIAGNOSIS — L4050 Arthropathic psoriasis, unspecified: Secondary | ICD-10-CM

## 2020-11-26 NOTE — Telephone Encounter (Signed)
Patient states she received a call from the specialty pharmacy informing her that they cannot refill her prescription of Humira until she schedules an appointment with Dr. Corliss Skains.  Patient states she had an appointment in September and is scheduled for her 4 month follow-up appointment on 01/07/20.  Patient states she had labwork with her PCP Dr. Clelia Croft who faxed the results to Dr. Corliss Skains last month.  Patient is requesting a return call.

## 2020-11-26 NOTE — Telephone Encounter (Signed)
Patient advised the reason we were unable to refill her medication is because we have not received her lab results even after we have requested them. Patient states she has called and asked them to send her a hard copy of her results and she will get them to [place].

## 2020-12-01 ENCOUNTER — Other Ambulatory Visit: Payer: Self-pay

## 2020-12-01 DIAGNOSIS — I6523 Occlusion and stenosis of bilateral carotid arteries: Secondary | ICD-10-CM

## 2020-12-07 ENCOUNTER — Telehealth: Payer: Self-pay | Admitting: *Deleted

## 2020-12-07 NOTE — Telephone Encounter (Signed)
Labs received from [facility] on 09/30/2020 Reviewed by Sherron Ales, PA-C  UA: WBC Esterase 2+, WBC 6-10 Triglycerides 165 LDL/HDL Ratio 1.4 Glucose 638 BUN 26 Eos 0.5 MPV 10.7 Hgb A1C 7.1  patient on Humira every 14 days.

## 2020-12-10 ENCOUNTER — Ambulatory Visit: Payer: Medicare Other

## 2020-12-10 ENCOUNTER — Encounter (HOSPITAL_COMMUNITY): Payer: Medicare Other

## 2020-12-13 ENCOUNTER — Other Ambulatory Visit: Payer: Self-pay | Admitting: Rheumatology

## 2020-12-13 DIAGNOSIS — L4050 Arthropathic psoriasis, unspecified: Secondary | ICD-10-CM

## 2020-12-13 NOTE — Telephone Encounter (Signed)
Last Visit:09/02/2020 Next Visit:01/06/2021 Labs:09/30/2020 Glucose 156 BUN 26 Eos 0.5 MPV 10.7 TB Gold:06/25/2020 Neg  Current Dose per office note9/08/2020:Humira 40 mg sq injections every 14 days  AV:[id number] arthritis  Okay to refill Humira?

## 2020-12-23 ENCOUNTER — Other Ambulatory Visit: Payer: Self-pay

## 2020-12-23 ENCOUNTER — Ambulatory Visit: Payer: Medicare Other | Admitting: Podiatry

## 2020-12-23 ENCOUNTER — Encounter: Payer: Self-pay | Admitting: Podiatry

## 2020-12-23 DIAGNOSIS — B351 Tinea unguium: Secondary | ICD-10-CM

## 2020-12-23 DIAGNOSIS — M79674 Pain in right toe(s): Secondary | ICD-10-CM

## 2020-12-23 DIAGNOSIS — L600 Ingrowing nail: Secondary | ICD-10-CM | POA: Diagnosis not present

## 2020-12-23 DIAGNOSIS — M79675 Pain in left toe(s): Secondary | ICD-10-CM

## 2020-12-23 NOTE — Progress Notes (Signed)
Office Visit Note  Patient: Margaret Hobbs             Date of Birth: 1951/10/08           MRN: [id number]             PCP: Martha Clan, MD Referring: Martha Clan, MD Visit Date: 01/06/2021 Occupation: @GUAROCC @  Subjective:  Medication management.   History of Present Illness: Margaret Hobbs is a 69 y.o. female with history of psoriatic arthritis, psoriasis and osteoarthritis.  She states her psoriatic arthritis is well controlled on Humira.  She has been taking Humira injections every other week and has been tolerating it well.  She has some psoriasis in her left ear which causes itching.  She recently has been experiencing some discomfort in her left CMC joint.  She denies any recent episodes of Achilles tendinitis, plantar fasciitis or iritis.  Activities of Daily Living:  Patient reports morning stiffness for a few  minutes.   Patient Denies nocturnal pain.  Difficulty dressing/grooming: Denies Difficulty climbing stairs: Reports Difficulty getting out of chair: Denies Difficulty using hands for taps, buttons, cutlery, and/or writing: Reports  Review of Systems  Constitutional: Positive for fatigue.  HENT: Positive for nose dryness. Negative for mouth sores and mouth dryness.   Eyes: Negative for pain, itching and dryness.  Respiratory: Negative for shortness of breath and difficulty breathing.   Cardiovascular: Negative for chest pain and palpitations.  Gastrointestinal: Positive for constipation. Negative for blood in stool and diarrhea.  Endocrine: Negative for increased urination.  Genitourinary: Negative for difficulty urinating.  Musculoskeletal: Positive for arthralgias, joint pain, myalgias, morning stiffness and myalgias. Negative for joint swelling and muscle tenderness.  Skin: Positive for rash. Negative for color change and redness.  Allergic/Immunologic: Negative for susceptible to infections.  Neurological: Positive for headaches. Negative for  dizziness, numbness, memory loss and weakness.  Hematological: Positive for bruising/bleeding tendency.  Psychiatric/Behavioral: Negative for confusion.    PMFS History:  Patient Active Problem List   Diagnosis Date Noted  . 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 Round hole of right eye 05/06/2020  . Severe nonproliferative diabetic retinopathy of left eye (HCC) 05/06/2020  . Retinal hemorrhage of left eye 05/06/2020  . Peripheral arterial disease (HCC) 10/17/2019  . Carotid artery disease (HCC) 01/24/2017  . Primary osteoarthritis of both knees 11/29/2016  . Other psoriasis 11/28/2016  . Psoriatic arthritis (HCC) 11/28/2016  . High risk medication use 11/28/2016  . Type 2 diabetes mellitus (HCC) 03/30/2009  . HYPERCHOLESTEROLEMIA 03/30/2009  . Dyslipidemia 03/30/2009  . Essential hypertension 03/30/2009  . MYOCARDIAL INFARCTION 03/30/2009  . Coronary artery disease involving native coronary artery of native heart without angina pectoris 03/30/2009  . Cerebral artery occlusion with cerebral infarction (HCC) 03/30/2009  . URINARY TRACT INFECTION 03/30/2009  . ARTHRITIS 03/30/2009  . HYPERPARATHYROIDISM, HX OF 03/30/2009    Past Medical History:  Diagnosis Date  . Aphasia   . Arthritis   . CAD (coronary artery disease)   . Cerebrovascular disease   . CVA (cerebral vascular accident) (HCC)   . Depressed   . Diabetes mellitus   . DM2 (diabetes mellitus, type 2) (HCC)   . HLD (hyperlipidemia)   . HTN (hypertension)   . Hypercholesteremia   . Hyperparathyroidism   . MI (myocardial infarction) (HCC)   . Osteoporosis   . TIA (transient ischemic attack)   . UTI (urinary tract infection)     Family History  Problem Relation  Age of Onset  . Heart disease Mother        pacemaker  . Hypertension Mother   . Hyperlipidemia Mother   . Diabetes Brother        pre-diabetic   . Hypertension Son   . Sleep apnea Son   . Diabetes Other   .  Hypertension Other   . Alcohol abuse Other   . Coronary artery disease Other    Past Surgical History:  Procedure Laterality Date  . ABDOMINAL HYSTERECTOMY    . CATARACT EXTRACTION W/PHACO Bilateral 2017   Dr. Alben Spittle  . hysterectomy - unknown type     Social History   Social History Narrative  . Not on file   Immunization History  Administered Date(s) Administered  . PFIZER SARS-COV-2 Vaccination 02/16/2020, 03/08/2020, 11/05/2020     Objective: Vital Signs: BP (!) 121/59 (BP Location: Right Arm, Patient Position: Sitting, Cuff Size: Normal)   Pulse 73   Resp 15   Ht 5\' 2"  (1.575 m)   Wt 201 lb 9.6 oz (91.4 kg)   BMI 36.87 kg/m    Physical Exam Vitals and nursing note reviewed.  Constitutional:      Appearance: She is well-developed and well-nourished.  HENT:     Head: Normocephalic and atraumatic.  Eyes:     Extraocular Movements: EOM normal.     Conjunctiva/sclera: Conjunctivae normal.  Cardiovascular:     Rate and Rhythm: Normal rate and regular rhythm.     Pulses: Intact distal pulses.     Heart sounds: Normal heart sounds.  Pulmonary:     Effort: Pulmonary effort is normal.     Breath sounds: Normal breath sounds.  Abdominal:     General: Bowel sounds are normal.     Palpations: Abdomen is soft.  Musculoskeletal:     Cervical back: Normal range of motion.  Lymphadenopathy:     Cervical: No cervical adenopathy.  Skin:    General: Skin is warm and dry.     Capillary Refill: Capillary refill takes less than 2 seconds.     Comments: Dry scale  noted in the left ear canal.  Neurological:     Mental Status: She is alert and oriented to person, place, and time.     Comments: Right-sided hemiparesis due to previous stroke.  Psychiatric:        Mood and Affect: Mood and affect normal.        Behavior: Behavior normal.      Musculoskeletal Exam: C-spine was in good range of motion.  She had no tenderness over thoracic or lumbar spine.  No tenderness over  SI joints.  She has limited extension of her right shoulder.  Elbow joints, wrist joints with good range of motion.  She had tenderness over left CMC joint for which she has been using a CMC brace.  Mild DIP and PIP thickening with no synovitis was noted.  Hip joints and knee joints with good range of motion.  She had no tenderness over ankles or MTPs.  CDAI Exam: CDAI Score: -- Patient Global: --; Provider Global: -- Swollen: --; Tender: -- Joint Exam 01/06/2021   No joint exam has been documented for this visit   There is currently no information documented on the homunculus. Go to the Rheumatology activity and complete the homunculus joint exam.  Investigation: No additional findings.  Imaging: No results found.  Recent Labs: Lab Results  Component Value Date   WBC 7.1 07/01/2019   HGB 12.8  07/01/2019   PLT 250 07/01/2019   NA 139 07/01/2019   K 3.9 07/01/2019   CL 101 07/01/2019   CO2 30 07/01/2019   GLUCOSE 129 (H) 07/01/2019   BUN 27 (H) 07/01/2019   CREATININE 0.99 07/01/2019   BILITOT 0.4 07/01/2019   ALKPHOS 64 08/01/2016   AST 22 07/01/2019   ALT 19 07/01/2019   PROT 7.3 07/01/2019   ALBUMIN 3.1 (L) 08/01/2016   CALCIUM 9.7 07/01/2019   GFRAA 69 07/01/2019   QFTBGOLDPLUS NEGATIVE 06/25/2020    Speciality Comments: No specialty comments available.  Procedures:  No procedures performed Allergies: Remicade [infliximab] and Sulfa antibiotics   Assessment / Plan:     Visit Diagnoses: Psoriatic arthritis (HCC)-patient has no active synovitis.  She is tolerating Humira well and will continue current medication.  Other psoriasis-she has a small dry patch in her left ear canal.  High risk medication use - Humira 40 mg sq injections every 14 days.  Her labs are done at her PCPs office and there is stable.  We will get labs forwarded from her PCP.  Her last TB gold was in July.  She will get TB Gold again in July 2022.  She is fully vaccinated against COVID-19.   Use of mask, social distancing and hand hygiene was discussed.  Primary osteoarthritis of both knees-she continues to have some discomfort in her knee joints.  Muscle strengthening was emphasized.  Age-related osteoporosis without current pathological fracture - She is on Reclast IV infusion yearly prescribed by Dr. Clelia Croft.  She states that Reclast is on hold currently.    History of coronary artery disease  History of hypercholesterolemia  History of stroke  History of diabetes mellitus  Dyslipidemia  History of hypertension-blood pressure is well controlled.  Cerebral artery occlusion with cerebral infarction (HCC)-she has right lower extremity weakness.  Orders: No orders of the defined types were placed in this encounter.  No orders of the defined types were placed in this encounter.    Follow-Up Instructions: Return in about 4 months (around 05/06/2021) for Psoriatic arthritis.   Pollyann Savoy, MD  Note - This record has been created using Animal nutritionist.  Chart creation errors have been sought, but may not always  have been located. Such creation errors do not reflect on  the standard of medical care.

## 2020-12-23 NOTE — Patient Instructions (Signed)
Ingrown Toenail An ingrown toenail occurs when the corner or sides of a toenail grow into the surrounding skin. This causes discomfort and pain. The big toe is most commonly affected, but any of the toes can be affected. If an ingrown toenail is not treated, it can become infected. What are the causes? This condition may be caused by:  Wearing shoes that are too small or tight.  An injury, such as stubbing your toe or having your toe stepped on.  Improper cutting or care of your toenails.  Having nail or foot abnormalities that were present from birth (congenital abnormalities), such as having a nail that is too big for your toe. What increases the risk? The following factors may make you more likely to develop ingrown toenails:  Age. Nails tend to get thicker with age, so ingrown nails are more common among older people.  Cutting your toenails incorrectly, such as cutting them very short or cutting them unevenly. An ingrown toenail is more likely to get infected if you have:  Diabetes.  Blood flow (circulation) problems. What are the signs or symptoms? Symptoms of an ingrown toenail may include:  Pain, soreness, or tenderness.  Redness.  Swelling.  Hardening of the skin that surrounds the toenail. Signs that an ingrown toenail may be infected include:  Fluid or pus.  Symptoms that get worse instead of better. How is this diagnosed? An ingrown toenail may be diagnosed based on your medical history, your symptoms, and a physical exam. If you have fluid or blood coming from your toenail, a sample may be collected to test for the specific type of bacteria that is causing the infection. How is this treated? Treatment depends on how severe your ingrown toenail is. You may be able to care for your toenail at home.  If you have an infection, you may be prescribed antibiotic medicines.  If you have fluid or pus draining from your toenail, your health care provider may drain  it.  If you have trouble walking, you may be given crutches to use.  If you have a severe or infected ingrown toenail, you may need a procedure to remove part or all of the nail. Follow these instructions at home: Foot care   Do not pick at your toenail or try to remove it yourself.  Soak your foot in warm, soapy water. Do this for 20 minutes, 3 times a day, or as often as told by your health care provider. This helps to keep your toe clean and keep your skin soft.  Wear shoes that fit well and are not too tight. Your health care provider may recommend that you wear open-toed shoes while you heal.  Trim your toenails regularly and carefully. Cut your toenails straight across to prevent injury to the skin at the corners of the toenail. Do not cut your nails in a curved shape.  Keep your feet clean and dry to help prevent infection. Medicines  Take over-the-counter and prescription medicines only as told by your health care provider.  If you were prescribed an antibiotic, take it as told by your health care provider. Do not stop taking the antibiotic even if you start to feel better. Activity  Return to your normal activities as told by your health care provider. Ask your health care provider what activities are safe for you.  Avoid activities that cause pain. General instructions  If your health care provider told you to use crutches to help you move around, use them   as instructed.  Keep all follow-up visits as told by your health care provider. This is important. Contact a health care provider if:  You have more redness, swelling, pain, or other symptoms that do not improve with treatment.  You have fluid, blood, or pus coming from your toenail. Get help right away if:  You have a red streak on your skin that starts at your foot and spreads up your leg.  You have a fever. Summary  An ingrown toenail occurs when the corner or sides of a toenail grow into the surrounding  skin. This causes discomfort and pain. The big toe is most commonly affected, but any of the toes can be affected.  If an ingrown toenail is not treated, it can become infected.  Fluid or pus draining from your toenail is a sign of infection. Your health care provider may need to drain it. You may be given antibiotics to treat the infection.  Trimming your toenails regularly and properly can help you prevent an ingrown toenail. This information is not intended to replace advice given to you by your health care provider. Make sure you discuss any questions you have with your health care provider. Document Revised: 04/04/2019 Document Reviewed: 08/29/2017 Elsevier Patient Education  2020 [employer] Inc.  

## 2020-12-23 NOTE — Progress Notes (Signed)
  Subjective:  Patient ID: Margaret Hobbs, female    DOB: 1951-07-30,  MRN: [id number]  Chief Complaint  Patient presents with  . Nail Problem    Left foot hallux nail for about 1-2 weeks. Painful at times.    69 y.o. female presents with the above complaint. History confirmed with patient.  Thinks the corners are ingrowing.  She is a type II diabetic that is well controlled.  She is on Humira for psoriatic arthritis.  Objective:  Physical Exam: warm, good capillary refill, no trophic changes or ulcerative lesions, normal DP and PT pulses and normal sensory exam. Bilaterally she has ingrowing nails without paronychia medial and lateral border of the hallux, onychomycosis present hallux nail  Assessment:   1. Ingrowing left great toenail   2. Onychomycosis   3. Pain due to onychomycosis of toenails of both feet      Plan:  Patient was evaluated and treated and all questions answered.   Discussed etiology and treatment options for ingrown toenails with her.  I discussed removing the corners in a slant back fashion, partial nail avulsion both temporary and permanent.  Most of her pain was at the distal tips for the corners are ingrowing.  I recommended slant back debridement to alleviate pressure from the toenails.  I did have to anesthetize the left hallux to do this with 1.5 cc each of 2% Xylocaine plain and 0.5% Marcaine plain.  The right side we did not do today and she will continue to monitor.  If her pain is not alleviate by this we will consider temporary permanent matricectomy in the future.  Would not do this today because I think with her diabetes and biologic therapy with Humira she would be at high risk for infection and wound healing issues.  If we have to do in the future we will do with prolonged antibiotics after the procedure.  Return if symptoms worsen or fail to improve.

## 2021-01-06 ENCOUNTER — Other Ambulatory Visit: Payer: Self-pay

## 2021-01-06 ENCOUNTER — Ambulatory Visit: Payer: Medicare Other | Admitting: Rheumatology

## 2021-01-06 ENCOUNTER — Encounter: Payer: Self-pay | Admitting: Rheumatology

## 2021-01-06 VITALS — BP 121/59 | HR 73 | Resp 15 | Ht 62.0 in | Wt 201.6 lb

## 2021-01-06 DIAGNOSIS — L4050 Arthropathic psoriasis, unspecified: Secondary | ICD-10-CM | POA: Diagnosis not present

## 2021-01-06 DIAGNOSIS — M170 Bilateral primary osteoarthritis of knee: Secondary | ICD-10-CM | POA: Diagnosis not present

## 2021-01-06 DIAGNOSIS — L408 Other psoriasis: Secondary | ICD-10-CM

## 2021-01-06 DIAGNOSIS — Z8639 Personal history of other endocrine, nutritional and metabolic disease: Secondary | ICD-10-CM

## 2021-01-06 DIAGNOSIS — I6350 Cerebral infarction due to unspecified occlusion or stenosis of unspecified cerebral artery: Secondary | ICD-10-CM

## 2021-01-06 DIAGNOSIS — Z8679 Personal history of other diseases of the circulatory system: Secondary | ICD-10-CM

## 2021-01-06 DIAGNOSIS — Z8673 Personal history of transient ischemic attack (TIA), and cerebral infarction without residual deficits: Secondary | ICD-10-CM

## 2021-01-06 DIAGNOSIS — Z79899 Other long term (current) drug therapy: Secondary | ICD-10-CM

## 2021-01-06 DIAGNOSIS — E785 Hyperlipidemia, unspecified: Secondary | ICD-10-CM

## 2021-01-06 DIAGNOSIS — M810 Age-related osteoporosis without current pathological fracture: Secondary | ICD-10-CM

## 2021-01-06 NOTE — Patient Instructions (Signed)
Standing Labs We placed an order today for your standing lab work.   Please have your standing labs drawn in April and every 3 months   If possible, please have your labs drawn 2 weeks prior to your appointment so that the provider can discuss your results at your appointment.  We have open lab daily Monday through Thursday from 8:30-12:30 PM and 1:30-4:30 PM and Friday from 8:30-12:30 PM and 1:30-4:00 PM at the office of Dr. Maxine Huynh, [facility].   Please be advised, patients with office appointments requiring lab work will take precedents over walk-in lab work.  If possible, please come for your lab work on Monday and Friday afternoons, as you may experience shorter wait times. The office is located at [address], [address], [place], [place] [address] No appointment is necessary.   Labs are drawn by [facility]. Please bring your co-pay at the time of your lab draw.  You may receive a bill from [facility] for your lab work.  If you wish to have your labs drawn at another location, please call the office 24 hours in advance to send orders.  If you have any questions regarding directions or hours of operation,  please call [phone].   As a reminder, please drink plenty of water prior to coming for your lab work. Thanks!   

## 2021-01-09 ENCOUNTER — Other Ambulatory Visit: Payer: Self-pay | Admitting: Cardiovascular Disease

## 2021-01-11 ENCOUNTER — Telehealth: Payer: Self-pay | Admitting: *Deleted

## 2021-01-11 NOTE — Telephone Encounter (Signed)
Labs received from: [facility] on: 01/04/2021 Reviewed by: Sherron Ales, PA-C  Labs drawn: CMP, Vitamin D, CBC  Results: Glucose 164 EOS 0.5  Patient on Humira 40 mg/mL every 14 days.

## 2021-01-12 ENCOUNTER — Ambulatory Visit: Payer: Medicare Other | Admitting: Cardiovascular Disease

## 2021-02-15 ENCOUNTER — Other Ambulatory Visit: Payer: Self-pay | Admitting: Physician Assistant

## 2021-02-15 DIAGNOSIS — L4050 Arthropathic psoriasis, unspecified: Secondary | ICD-10-CM

## 2021-02-15 NOTE — Telephone Encounter (Signed)
Last Visit:01/06/2021 Next Visit: 05/05/2021 Labs received from: [facility] on: 01/04/2021 Reviewed by: Sherron Ales, PA-C  Labs drawn: CMP, Vitamin D, CBC  Results: Glucose 164 EOS 0.5    TB Gold: 06/25/2020  Current Dose per office note 01/06/2021, Humira 40 mg sq injections every 14 days  DX: Psoriatic arthritis   Last Fill:  12/13/2020  Okay to refill Humira?

## 2021-03-07 ENCOUNTER — Other Ambulatory Visit: Payer: Self-pay | Admitting: Cardiovascular Disease

## 2021-03-11 ENCOUNTER — Encounter: Payer: Self-pay | Admitting: Cardiovascular Disease

## 2021-03-11 ENCOUNTER — Ambulatory Visit: Payer: Medicare Other | Admitting: Cardiovascular Disease

## 2021-03-11 ENCOUNTER — Other Ambulatory Visit: Payer: Self-pay

## 2021-03-11 VITALS — BP 123/65 | HR 68 | Ht 62.75 in | Wt 203.4 lb

## 2021-03-11 DIAGNOSIS — I10 Essential (primary) hypertension: Secondary | ICD-10-CM | POA: Diagnosis not present

## 2021-03-11 DIAGNOSIS — I739 Peripheral vascular disease, unspecified: Secondary | ICD-10-CM | POA: Diagnosis not present

## 2021-03-11 DIAGNOSIS — I2510 Atherosclerotic heart disease of native coronary artery without angina pectoris: Secondary | ICD-10-CM

## 2021-03-11 MED ORDER — SODIUM CHLORIDE 0.9% FLUSH: 3.0000 mL | Freq: Two times a day (BID) | INTRAVENOUS | Status: DC

## 2021-03-11 NOTE — Progress Notes (Signed)
03/11/2021 Margaret Hobbs   06/13/1951  [id number]  Primary Physician Cleatis Polka., MD Primary Cardiologist: Runell Gess MD Nicholes Calamity, [place]  HPI:  Margaret Hobbs is a 70 y.o.  moderately overweight widowed Caucasian female mother of 1, grandmother of 4 grandchildren referred to me by Dr. Eden Emms for peripheral vascular valuation because of symptomatic left calf claudication.  I last saw her in the office 01/20/2020.She has a history of remote stroke back in June 2008 and remote inferior wall myocardial infarction January of the way treated with RCA bare-metal stenting. She has treated hypertension, diabetes and hyperlipidemia. She was complaining of left calf claudication when Dr. Eden Emms saw her back in February. Lower extremity arterial Doppler studies performed 03/03/2019 revealed a right ABI of 1 and a left ABI 0.7. She did have a high-frequency signal in her proximal right SFA and tibial vessel disease on the left.  Since I saw her last she did have a follow-up Doppler study performed 10/28/2019 revealing a left ABI of 0.57 with an occluded left SFA.    We have talked about angiography and intervention when I saw her a year ago and now she is at the point where she wishes to proceed with endovascular therapy.   Current Meds  Medication Sig  . amoxicillin (AMOXIL) 500 MG capsule Take 2,000 mg by mouth as directed. Takes 1 prior before dental procedures  . Ascorbic Acid (VITAMIN C) 500 MG tablet Take 500 mg by mouth daily.  [name] aspirin 81 MG tablet Take 81 mg by mouth daily.  . cholecalciferol (VITAMIN D) 1000 UNITS tablet Take 2,000 Units by mouth daily.  . cilostazol (PLETAL) 50 MG tablet Take 1 tablet (50 mg total) by mouth 2 (two) times daily.  . clopidogrel (PLAVIX) 75 MG tablet TAKE 1 TABLET BY MOUTH  DAILY  . co-enzyme Q-10 30 MG capsule Take 30 mg by mouth daily.  [name] ezetimibe (ZETIA) 10 MG tablet Take 10 mg by mouth daily.  . folic acid (FOLVITE) 1  MG tablet TAKE 2 TABLETS (2MG ) BY  MOUTH IN THE MORNING (Patient taking differently: Take 1 mg by mouth daily.)  . glipiZIDE-metformin (METAGLIP) 5-500 MG per tablet Take 1 tablet by mouth 2 (two) times daily before a meal.  . HUMIRA 40 MG/0.4ML PSKT INJECT 40MG  SUBCUTANEOUSLY  EVERY OTHER WEEK  . hydrALAZINE (APRESOLINE) 50 MG tablet TAKE 1 TABLET BY MOUTH IN  THE MORNING AND AT BEDTIME  . metoprolol succinate (TOPROL-XL) 100 MG 24 hr tablet TAKE 1 TABLET BY MOUTH  DAILY  . nitroGLYCERIN (NITROSTAT) 0.4 MG SL tablet PLACE 1 TABLET UNDER THE TONGUE EVERY 5 (FIVE) MINUTES AS NEEDED FOR CHEST PAIN (3 DOSES MAX)  . olmesartan-hydrochlorothiazide (BENICAR HCT) 40-25 MG tablet Take 1 tablet by mouth daily.   . potassium chloride 20 MEQ/15ML (10%) SOLN 20 mEq daily.   . ramipril (ALTACE) 10 MG capsule   . simvastatin (ZOCOR) 40 MG tablet Take 40 mg by mouth at bedtime.  . vitamin A UNIT capsule Take 10,000 Units by mouth daily.  . vitamin E 400 UNIT capsule Take 400 Units by mouth daily.  . [DISCONTINUED] rosuvastatin (CRESTOR) 20 MG tablet Take 20 mg by mouth daily.      Allergies  Allergen Reactions  . Remicade [Infliximab] Other (See Comments)    Convulsion and felt cold   . Sulfa Antibiotics Rash    Social History   Socioeconomic History  . Marital status: Widowed  Spouse name: Not on file  . Number of children: 1  . Years of education: Not on file  . Highest education level: Not on file  Occupational History  . Not on file  Tobacco Use  . Smoking status: Never Smoker  . Smokeless tobacco: Never Used  Vaping Use  . Vaping Use: Never used  Substance and Sexual Activity  . Alcohol use: No  . Drug use: Never  . Sexual activity: Not on file  Other Topics Concern  . Not on file  Social History Narrative  . Not on file   Social Determinants of Health   Financial Resource Strain: Not on file  Food Insecurity: Not on file  Transportation Needs: Not on file  Physical  Activity: Not on file  Stress: Not on file  Social Connections: Not on file  Intimate Partner Violence: Not on file     Review of Systems: General: negative for chills, fever, night sweats or weight changes.  Cardiovascular: negative for chest pain, dyspnea on exertion, edema, orthopnea, palpitations, paroxysmal nocturnal dyspnea or shortness of breath Dermatological: negative for rash Respiratory: negative for cough or wheezing Urologic: negative for hematuria Abdominal: negative for nausea, vomiting, diarrhea, bright red blood per rectum, melena, or hematemesis Neurologic: negative for visual changes, syncope, or dizziness All other systems reviewed and are otherwise negative except as noted above.    Blood pressure 123/65, pulse 68, height 5' 2.75" (1.594 m), weight 203 lb 6.4 oz (92.3 kg), SpO2 98 %.  General appearance: alert and no distress Neck: no adenopathy, no carotid bruit, no JVD, supple, symmetrical, trachea midline and thyroid not enlarged, symmetric, no tenderness/mass/nodules Lungs: clear to auscultation bilaterally Heart: regular rate and rhythm, S1, S2 normal, no murmur, click, rub or gallop Extremities: extremities normal, atraumatic, no cyanosis or edema Pulses: 2+ and symmetric Skin: Skin color, texture, turgor normal. No rashes or lesions Neurologic: Alert and oriented X 3, normal strength and tone. Normal symmetric reflexes. Normal coordination and gait  EKG normal sinus rhythm at 67 with inferior Q waves and poor R wave progression.  I personally reviewed this EKG.  ASSESSMENT AND PLAN:   Peripheral arterial disease (HCC) Margaret Hobbs returns today for follow-up.  I saw her a year ago.  She was referral from Dr. Eden Emms for lifestyle limiting claudication.  Her Dopplers performed 10/27/2019 revealed a left ABI of 0.57 with an occluded left SFA.  She wishes to proceed with angiography and endovascular therapy.      Runell Gess MD FACP,FACC,FAHA,  [facility] 03/11/2021 11:23 AM

## 2021-03-11 NOTE — Patient Instructions (Addendum)
    [facility] CARDIOVASCULAR DIVISION [facility] [place] [address] [place] [address] Dept: [phone] Loc: [phone]  Margaret Hobbs  03/11/2021  You are scheduled for a Peripheral Angiogram on Thursday, April 7 with Dr. Nanetta Batty.  1. Please arrive at the [facility] (Main Entrance A) at [facility]: [address] [place], [place] [address] at 7:30 AM (This time is two hours before your procedure to ensure your preparation). Free valet parking service is available.   Special note: Every effort is made to have your procedure done on time. Please understand that emergencies sometimes delay scheduled procedures.  2. Diet: Do not eat solid foods after midnight.  The patient may have clear liquids until 5am upon the day of the procedure.  3. Labs: You will need to have blood drawn today.  4. Medication instructions in preparation for your procedure:    Do not take Diabetes Med Metaglip on the day of the procedure and HOLD 48 HOURS AFTER THE PROCEDURE.  On the morning of your procedure, take your Aspirin/Plavix and any morning medicines NOT listed above.  You may use sips of water.  5. Plan for one night stay--bring personal belongings. 6. Bring a current list of your medications and current insurance cards. 7. You MUST have a responsible person to drive you home. 8. Someone MUST be with you the first 24 hours after you arrive home or your discharge will be delayed. 9. Please wear clothes that are easy to get on and off and wear slip-on shoes.  Thank you for allowing [place] to care for you!   -- [facility]  You will need a COVID-19  test prior to your procedure. You are scheduled for Tuesday 4/5 at 10:30 AM. This is a Drive Up Visit at [address] [place], [place] [address]. Someone will direct you to the appropriate testing line. Stay in your car and someone will be with  you shortly.  You will need Lower extremity dopplers 1 week after your procedure.  You will need a follow up office visit 2 weeks after your procedure.

## 2021-03-11 NOTE — Assessment & Plan Note (Signed)
Margaret Hobbs returns today for follow-up.  I saw her a year ago.  She was referral from Dr. Eden Emms for lifestyle limiting claudication.  Her Dopplers performed 10/27/2019 revealed a left ABI of 0.57 with an occluded left SFA.  She wishes to proceed with angiography and endovascular therapy.

## 2021-03-12 LAB — CBC
Hematocrit: 38.3 % (ref 34.0–46.6)
Hemoglobin: 12.6 g/dL (ref 11.1–15.9)
MCH: 29.6 pg (ref 26.6–33.0)
MCHC: 32.9 g/dL (ref 31.5–35.7)
MCV: 90 fL (ref 79–97)
Platelets: 272 10*3/uL (ref 150–450)
RBC: 4.25 x10E6/uL (ref 3.77–5.28)
RDW: 12.2 % (ref 11.7–15.4)
WBC: 6.8 10*3/uL (ref 3.4–10.8)

## 2021-03-12 LAB — BASIC METABOLIC PANEL
BUN/Creatinine Ratio: 26 (ref 12–28)
BUN: 23 mg/dL (ref 8–27)
CO2: 28 mmol/L (ref 20–29)
Calcium: 9.6 mg/dL (ref 8.7–10.3)
Chloride: 101 mmol/L (ref 96–106)
Creatinine, Ser: 0.89 mg/dL (ref 0.57–1.00)
Glucose: 95 mg/dL (ref 65–99)
Potassium: 3.8 mmol/L (ref 3.5–5.2)
Sodium: 145 mmol/L — ABNORMAL HIGH (ref 134–144)
eGFR: 71 mL/min/{1.73_m2} (ref >59)

## 2021-03-21 ENCOUNTER — Telehealth: Payer: Self-pay | Admitting: Cardiovascular Disease

## 2021-03-21 NOTE — Telephone Encounter (Signed)
Spoke with the patient about her procedure with Dr. Allyson Sabal. She was calling to clarify that she should take the Plavix the morning of the procedure. She has been advised to take it.

## 2021-03-21 NOTE — Telephone Encounter (Signed)
Patient has a few questions regarding 03/31/21 procedure with Dr. Allyson Sabal. She has reviewed her paperwork with the pre-op instructions and she would like to know if she should only be concerned with the instructions that are highlighted. She would also like to know how to take Clopidogrel the day of her procedure. Please advise.

## 2021-03-28 ENCOUNTER — Ambulatory Visit: Payer: Medicare Other | Admitting: Cardiovascular Disease

## 2021-03-29 ENCOUNTER — Other Ambulatory Visit (HOSPITAL_COMMUNITY)
Admission: RE | Admit: 2021-03-29 | Discharge: 2021-03-29 | Disposition: A | Discharge status: Home / Self Care | Payer: Medicare Other | Source: Ambulatory Visit | Attending: Cardiovascular Disease | Admitting: Cardiovascular Disease

## 2021-03-29 DIAGNOSIS — Z20822 Contact with and (suspected) exposure to covid-19: Secondary | ICD-10-CM | POA: Diagnosis not present

## 2021-03-29 DIAGNOSIS — Z01812 Encounter for preprocedural laboratory examination: Secondary | ICD-10-CM | POA: Diagnosis present

## 2021-03-29 LAB — SARS CORONAVIRUS 2 (TAT 6-24 HRS): SARS Coronavirus 2: NEGATIVE

## 2021-03-30 ENCOUNTER — Other Ambulatory Visit: Payer: Self-pay | Admitting: Cardiovascular Disease

## 2021-03-30 ENCOUNTER — Telehealth: Payer: Self-pay | Admitting: *Deleted

## 2021-03-30 DIAGNOSIS — I10 Essential (primary) hypertension: Secondary | ICD-10-CM

## 2021-03-30 NOTE — Telephone Encounter (Signed)
Pt contacted pre-catheterization scheduled at [facility] for: Thursday March 31, 2021 9:30 AM Verified arrival time and place: [facility] Main Entrance A [facility]) at: 7:30 AM   No solid food after midnight prior to cath, clear liquids until 5 AM day of procedure.  Hold: Metaglip-day of procedure and 48 hours post procedure Benicar-HCT/KCl-AM of procedure  Except hold medications AM meds can be  taken pre-cath with sips of water including: ASA 81 mg Plavix 75 mg   Confirmed patient has responsible adult to drive home post procedure and be with patient first 24 hours after arriving home: * see below  You are allowed ONE visitor in the waiting room during the time you are at the hospital for your procedure. Both you and your visitor must wear a mask once you enter the hospital.   Reviewed procedure/mask/visitor instructions with patient.   * Patient reports she would not have anyone to stay with her first 24 hours home if she is same day discharge tomorrow. Pt states she discussed this with Dr Allyson Sabal and was told she could stay overnight at the hospital after the procedure tomorrow. Pt states that she would have transportation home from the hospital the day after the procedure.

## 2021-03-31 ENCOUNTER — Encounter (HOSPITAL_COMMUNITY): Payer: Self-pay | Admitting: Cardiovascular Disease

## 2021-03-31 ENCOUNTER — Other Ambulatory Visit: Payer: Self-pay

## 2021-03-31 ENCOUNTER — Observation Stay (HOSPITAL_COMMUNITY)
Admission: AD | Admit: 2021-03-31 | Discharge: 2021-04-01 | Disposition: A | Discharge status: Home / Self Care | Payer: Medicare Other | Attending: Cardiovascular Disease | Admitting: Cardiovascular Disease

## 2021-03-31 ENCOUNTER — Ambulatory Visit: Payer: Medicare Other | Admitting: Cardiovascular Disease

## 2021-03-31 ENCOUNTER — Encounter (HOSPITAL_COMMUNITY)
Admission: AD | Disposition: A | Discharge status: Home / Self Care | Payer: Self-pay | Source: Home / Self Care | Attending: Cardiovascular Disease

## 2021-03-31 DIAGNOSIS — E119 Type 2 diabetes mellitus without complications: Secondary | ICD-10-CM | POA: Diagnosis not present

## 2021-03-31 DIAGNOSIS — Z7901 Long term (current) use of anticoagulants: Secondary | ICD-10-CM | POA: Insufficient documentation

## 2021-03-31 DIAGNOSIS — I10 Essential (primary) hypertension: Secondary | ICD-10-CM | POA: Insufficient documentation

## 2021-03-31 DIAGNOSIS — E785 Hyperlipidemia, unspecified: Secondary | ICD-10-CM | POA: Diagnosis not present

## 2021-03-31 DIAGNOSIS — I4891 Unspecified atrial fibrillation: Principal | ICD-10-CM | POA: Insufficient documentation

## 2021-03-31 DIAGNOSIS — I739 Peripheral vascular disease, unspecified: Secondary | ICD-10-CM | POA: Insufficient documentation

## 2021-03-31 DIAGNOSIS — I2510 Atherosclerotic heart disease of native coronary artery without angina pectoris: Secondary | ICD-10-CM | POA: Diagnosis not present

## 2021-03-31 DIAGNOSIS — I70211 Atherosclerosis of native arteries of extremities with intermittent claudication, right leg: Secondary | ICD-10-CM | POA: Diagnosis not present

## 2021-03-31 DIAGNOSIS — I25119 Atherosclerotic heart disease of native coronary artery with unspecified angina pectoris: Secondary | ICD-10-CM | POA: Diagnosis present

## 2021-03-31 HISTORY — PX: ABDOMINAL AORTOGRAM W/LOWER EXTREMITY: CATH118223

## 2021-03-31 LAB — GLUCOSE, CAPILLARY
Glucose-Capillary: 166 mg/dL — ABNORMAL HIGH (ref 70–99)
Glucose-Capillary: 193 mg/dL — ABNORMAL HIGH (ref 70–99)

## 2021-03-31 LAB — POCT ACTIVATED CLOTTING TIME
Activated Clotting Time: 309 seconds
Activated Clotting Time: 237 seconds
Activated Clotting Time: 184 seconds

## 2021-03-31 SURGERY — ABDOMINAL AORTOGRAM W/LOWER EXTREMITY
Anesthesia: LOCAL | Laterality: Bilateral

## 2021-03-31 MED ORDER — MORPHINE SULFATE (PF) 2 MG/ML IV SOLN
2.0000 mg | INTRAVENOUS | Status: DC | PRN
Start: 2021-03-31 — End: 2021-04-01

## 2021-03-31 MED ORDER — MIDAZOLAM HCL 2 MG/2ML IJ SOLN
INTRAMUSCULAR | Status: DC | PRN
  Administered 2021-03-31: 1 mg via INTRAVENOUS

## 2021-03-31 MED ORDER — SODIUM CHLORIDE 0.9 % IV SOLN: 250.0000 mL | INTRAVENOUS | Status: DC | PRN

## 2021-03-31 MED ORDER — MIDAZOLAM HCL 2 MG/2ML IJ SOLN
INTRAMUSCULAR | Status: AC
  Filled 2021-03-31: qty 2

## 2021-03-31 MED ORDER — SODIUM CHLORIDE 0.9% FLUSH
3.0000 mL | Freq: Two times a day (BID) | INTRAVENOUS | Status: DC
  Administered 2021-03-31 – 2021-04-01 (×2): 3 mL via INTRAVENOUS

## 2021-03-31 MED ORDER — HYDRALAZINE HCL 20 MG/ML IJ SOLN: 5.0000 mg | INTRAMUSCULAR | Status: DC | PRN

## 2021-03-31 MED ORDER — SODIUM CHLORIDE 0.9% FLUSH: 3.0000 mL | INTRAVENOUS | Status: DC | PRN

## 2021-03-31 MED ORDER — HYDROCHLOROTHIAZIDE 25 MG PO TABS
25.0000 mg | ORAL_TABLET | Freq: Every day | ORAL | Status: DC
  Administered 2021-04-01: 25 mg via ORAL
  Filled 2021-03-31: qty 1

## 2021-03-31 MED ORDER — ASPIRIN 81 MG PO TABS: 81.0000 mg | ORAL_TABLET | ORAL | Status: DC

## 2021-03-31 MED ORDER — LABETALOL HCL 5 MG/ML IV SOLN: 10.0000 mg | INTRAVENOUS | Status: DC | PRN

## 2021-03-31 MED ORDER — METOPROLOL SUCCINATE ER 100 MG PO TB24
100.0000 mg | ORAL_TABLET | Freq: Every day | ORAL | Status: DC
  Administered 2021-04-01: 100 mg via ORAL
  Filled 2021-03-31: qty 1

## 2021-03-31 MED ORDER — ACETAMINOPHEN 325 MG PO TABS: 650.0000 mg | ORAL_TABLET | ORAL | Status: DC | PRN

## 2021-03-31 MED ORDER — IRBESARTAN 150 MG PO TABS
300.0000 mg | ORAL_TABLET | Freq: Every day | ORAL | Status: DC
  Administered 2021-04-01: 300 mg via ORAL
  Filled 2021-03-31: qty 2

## 2021-03-31 MED ORDER — LIDOCAINE HCL (PF) 1 % IJ SOLN
INTRAMUSCULAR | Status: AC
  Filled 2021-03-31: qty 30

## 2021-03-31 MED ORDER — LIDOCAINE HCL (PF) 1 % IJ SOLN
INTRAMUSCULAR | Status: DC | PRN
  Administered 2021-03-31: 25 mL

## 2021-03-31 MED ORDER — SODIUM CHLORIDE 0.9 % IV SOLN: INTRAVENOUS | Status: AC

## 2021-03-31 MED ORDER — ATORVASTATIN CALCIUM 80 MG PO TABS
80.0000 mg | ORAL_TABLET | Freq: Every day | ORAL | Status: DC
  Administered 2021-03-31: 80 mg via ORAL
  Filled 2021-03-31: qty 1

## 2021-03-31 MED ORDER — SODIUM CHLORIDE 0.9 % WEIGHT BASED INFUSION: 1.0000 mL/kg/h | INTRAVENOUS | Status: DC

## 2021-03-31 MED ORDER — AMIODARONE LOAD VIA INFUSION
INTRAVENOUS | Status: DC | PRN
  Administered 2021-03-31: 150 mg via INTRAVENOUS

## 2021-03-31 MED ORDER — FENTANYL CITRATE (PF) 100 MCG/2ML IJ SOLN
50.0000 ug | Freq: Once | INTRAMUSCULAR | Status: AC
  Administered 2021-03-31: 50 ug via INTRAVENOUS

## 2021-03-31 MED ORDER — EZETIMIBE 10 MG PO TABS
10.0000 mg | ORAL_TABLET | Freq: Every day | ORAL | Status: DC
  Administered 2021-04-01: 10 mg via ORAL
  Filled 2021-03-31: qty 1

## 2021-03-31 MED ORDER — HEPARIN (PORCINE) IN NACL 1000-0.9 UT/500ML-% IV SOLN
INTRAVENOUS | Status: DC | PRN
  Administered 2021-03-31: 500 mL

## 2021-03-31 MED ORDER — CLOPIDOGREL BISULFATE 75 MG PO TABS: 75.0000 mg | ORAL_TABLET | Freq: Every day | ORAL | Status: DC

## 2021-03-31 MED ORDER — OLMESARTAN MEDOXOMIL-HCTZ 40-25 MG PO TABS: 1.0000 | ORAL_TABLET | Freq: Every day | ORAL | Status: DC

## 2021-03-31 MED ORDER — SIMVASTATIN 20 MG PO TABS: 40.0000 mg | ORAL_TABLET | Freq: Every day | ORAL | Status: DC

## 2021-03-31 MED ORDER — HEPARIN SODIUM (PORCINE) 1000 UNIT/ML IJ SOLN
INTRAMUSCULAR | Status: DC | PRN
  Administered 2021-03-31: 10000 [IU] via INTRAVENOUS

## 2021-03-31 MED ORDER — FENTANYL CITRATE (PF) 100 MCG/2ML IJ SOLN
INTRAMUSCULAR | Status: DC | PRN
  Administered 2021-03-31: 25 ug via INTRAVENOUS

## 2021-03-31 MED ORDER — ASPIRIN 81 MG PO CHEW: 81.0000 mg | CHEWABLE_TABLET | ORAL | Status: DC

## 2021-03-31 MED ORDER — FENTANYL CITRATE (PF) 100 MCG/2ML IJ SOLN
INTRAMUSCULAR | Status: AC
  Filled 2021-03-31: qty 2

## 2021-03-31 MED ORDER — ONDANSETRON HCL 4 MG/2ML IJ SOLN: 4.0000 mg | Freq: Four times a day (QID) | INTRAMUSCULAR | Status: DC | PRN

## 2021-03-31 MED ORDER — AMIODARONE HCL IN DEXTROSE 360-4.14 MG/200ML-% IV SOLN
INTRAVENOUS | Status: AC
  Filled 2021-03-31: qty 200

## 2021-03-31 MED ORDER — NITROGLYCERIN 0.4 MG SL SUBL: 0.4000 mg | SUBLINGUAL_TABLET | SUBLINGUAL | Status: DC | PRN

## 2021-03-31 MED ORDER — CILOSTAZOL 50 MG PO TABS
50.0000 mg | ORAL_TABLET | Freq: Two times a day (BID) | ORAL | Status: DC
  Administered 2021-03-31 – 2021-04-01 (×2): 50 mg via ORAL
  Filled 2021-03-31 (×4): qty 1

## 2021-03-31 MED ORDER — ASPIRIN EC 81 MG PO TBEC: 81.0000 mg | DELAYED_RELEASE_TABLET | Freq: Every day | ORAL | Status: DC

## 2021-03-31 MED ORDER — HEPARIN (PORCINE) IN NACL 1000-0.9 UT/500ML-% IV SOLN
INTRAVENOUS | Status: AC
  Filled 2021-03-31: qty 1000

## 2021-03-31 MED ORDER — SODIUM CHLORIDE 0.9 % WEIGHT BASED INFUSION
3.0000 mL/kg/h | INTRAVENOUS | Status: DC
  Administered 2021-03-31: 3 mL/kg/h via INTRAVENOUS

## 2021-03-31 MED ORDER — HYDRALAZINE HCL 50 MG PO TABS
50.0000 mg | ORAL_TABLET | Freq: Two times a day (BID) | ORAL | Status: DC
  Administered 2021-03-31 – 2021-04-01 (×2): 50 mg via ORAL
  Filled 2021-03-31 (×2): qty 1

## 2021-03-31 MED ORDER — CLOPIDOGREL BISULFATE 75 MG PO TABS
75.0000 mg | ORAL_TABLET | Freq: Every day | ORAL | Status: DC
  Administered 2021-04-01: 75 mg via ORAL
  Filled 2021-03-31: qty 1

## 2021-03-31 MED ORDER — AMIODARONE HCL IN DEXTROSE 360-4.14 MG/200ML-% IV SOLN
INTRAVENOUS | Status: AC | PRN
  Administered 2021-03-31: 60 mg/h via INTRAVENOUS

## 2021-03-31 SURGICAL SUPPLY — 12 items
CATH ANGIO 5F PIGTAIL 65CM (CATHETERS) ×1 IMPLANT
CATH CROSS OVER TEMPO 5F (CATHETERS) ×1 IMPLANT
KIT PV (KITS) ×2 IMPLANT
SHEATH HIGHFLEX ANSEL 7FR 55CM (SHEATH) ×1 IMPLANT
SHEATH PINNACLE 5F 10CM (SHEATH) ×1 IMPLANT
SHEATH PINNACLE 7F 10CM (SHEATH) ×1 IMPLANT
SHEATH PROBE COVER 6X72 (BAG) ×1 IMPLANT
SYR MEDRAD MARK 7 150ML (SYRINGE) ×2 IMPLANT
TRANSDUCER W/STOPCOCK (MISCELLANEOUS) ×2 IMPLANT
TRAY PV CATH (CUSTOM PROCEDURE TRAY) ×2 IMPLANT
WIRE HITORQ VERSACORE ST 145CM (WIRE) ×1 IMPLANT
WIRE ROSEN-J .035X180CM (WIRE) ×2 IMPLANT

## 2021-03-31 NOTE — Progress Notes (Signed)
Site area: rt groin Site Prior to Removal:  Level 0 Pressure Applied For: 30 minutes Manual:   yes Patient Status During Pull:  stable Post Pull Site:  Level 0 Post Pull Instructions Given:  yes Post Pull Pulses Present: rt dp and pt dopplered Dressing Applied:  Gauze and tegaderm Bedrest begins @ 1500 Comments: premedicated for rt groin tenderness

## 2021-03-31 NOTE — Plan of Care (Signed)
  Problem: Activity: Goal: Risk for activity intolerance will decrease Outcome: Progressing   Problem: Nutrition: Goal: Adequate nutrition will be maintained Outcome: Progressing   

## 2021-04-01 ENCOUNTER — Other Ambulatory Visit: Payer: Self-pay | Admitting: Student

## 2021-04-01 ENCOUNTER — Other Ambulatory Visit (HOSPITAL_COMMUNITY): Payer: Self-pay

## 2021-04-01 ENCOUNTER — Encounter (HOSPITAL_COMMUNITY): Payer: Self-pay | Admitting: Cardiovascular Disease

## 2021-04-01 ENCOUNTER — Telehealth: Payer: Self-pay

## 2021-04-01 DIAGNOSIS — E119 Type 2 diabetes mellitus without complications: Secondary | ICD-10-CM | POA: Diagnosis not present

## 2021-04-01 DIAGNOSIS — I739 Peripheral vascular disease, unspecified: Secondary | ICD-10-CM

## 2021-04-01 DIAGNOSIS — E785 Hyperlipidemia, unspecified: Secondary | ICD-10-CM | POA: Diagnosis not present

## 2021-04-01 DIAGNOSIS — I10 Essential (primary) hypertension: Secondary | ICD-10-CM

## 2021-04-01 DIAGNOSIS — I4891 Unspecified atrial fibrillation: Secondary | ICD-10-CM | POA: Diagnosis present

## 2021-04-01 DIAGNOSIS — I2510 Atherosclerotic heart disease of native coronary artery without angina pectoris: Secondary | ICD-10-CM | POA: Diagnosis not present

## 2021-04-01 DIAGNOSIS — E1159 Type 2 diabetes mellitus with other circulatory complications: Secondary | ICD-10-CM

## 2021-04-01 LAB — CBC
HCT: 35.2 % — ABNORMAL LOW (ref 36.0–46.0)
Hemoglobin: 11.6 g/dL — ABNORMAL LOW (ref 12.0–15.0)
MCH: 30.4 pg (ref 26.0–34.0)
MCHC: 33 g/dL (ref 30.0–36.0)
MCV: 92.1 fL (ref 80.0–100.0)
Platelets: 245 10*3/uL (ref 150–400)
RBC: 3.82 MIL/uL — ABNORMAL LOW (ref 3.87–5.11)
RDW: 12.6 % (ref 11.5–15.5)
WBC: 6.9 10*3/uL (ref 4.0–10.5)
nRBC: 0 % (ref 0.0–0.2)

## 2021-04-01 LAB — BASIC METABOLIC PANEL
Anion gap: 6 (ref 5–15)
BUN: 20 mg/dL (ref 8–23)
CO2: 29 mmol/L (ref 22–32)
Calcium: 8.8 mg/dL — ABNORMAL LOW (ref 8.9–10.3)
Chloride: 102 mmol/L (ref 98–111)
Creatinine, Ser: 1.09 mg/dL — ABNORMAL HIGH (ref 0.44–1.00)
GFR, Estimated: 55 mL/min — ABNORMAL LOW (ref >60)
Glucose, Bld: 243 mg/dL — ABNORMAL HIGH (ref 70–99)
Potassium: 3.1 mmol/L — ABNORMAL LOW (ref 3.5–5.1)
Sodium: 137 mmol/L (ref 135–145)

## 2021-04-01 LAB — TSH: TSH: 1.254 u[IU]/mL (ref 0.350–4.500)

## 2021-04-01 LAB — GLUCOSE, CAPILLARY
Glucose-Capillary: 197 mg/dL — ABNORMAL HIGH (ref 70–99)
Glucose-Capillary: 197 mg/dL — ABNORMAL HIGH (ref 70–99)

## 2021-04-01 LAB — HEMOGLOBIN A1C
Hgb A1c MFr Bld: 7.2 % — ABNORMAL HIGH (ref 4.8–5.6)
Mean Plasma Glucose: 159.94 mg/dL

## 2021-04-01 LAB — MAGNESIUM: Magnesium: 1.6 mg/dL — ABNORMAL LOW (ref 1.7–2.4)

## 2021-04-01 MED ORDER — ROSUVASTATIN CALCIUM 40 MG PO TABS
40.0000 mg | ORAL_TABLET | Freq: Every day | ORAL | 5 refills | Status: DC
  Filled 2021-04-01: qty 30, 30d supply, fill #0

## 2021-04-01 MED ORDER — GLIPIZIDE-METFORMIN HCL 5-500 MG PO TABS: 1.0000 | ORAL_TABLET | Freq: Two times a day (BID) | ORAL | Status: DC

## 2021-04-01 MED ORDER — MAGNESIUM SULFATE 2 GM/50ML IV SOLN
2.0000 g | Freq: Once | INTRAVENOUS | Status: AC
  Administered 2021-04-01: 2 g via INTRAVENOUS
  Filled 2021-04-01: qty 50

## 2021-04-01 MED ORDER — INSULIN ASPART 100 UNIT/ML ~~LOC~~ SOLN
0.0000 [IU] | Freq: Three times a day (TID) | SUBCUTANEOUS | Status: DC
  Administered 2021-04-01 (×2): 3 [IU] via SUBCUTANEOUS

## 2021-04-01 MED ORDER — POTASSIUM CHLORIDE CRYS ER 20 MEQ PO TBCR
60.0000 meq | EXTENDED_RELEASE_TABLET | Freq: Once | ORAL | Status: AC
  Administered 2021-04-01: 60 meq via ORAL
  Filled 2021-04-01: qty 3

## 2021-04-01 MED ORDER — APIXABAN 5 MG PO TABS
5.0000 mg | ORAL_TABLET | Freq: Two times a day (BID) | ORAL | 11 refills | Status: DC
  Filled 2021-04-01: qty 60, 30d supply, fill #0

## 2021-04-01 MED ORDER — APIXABAN 5 MG PO TABS
5.0000 mg | ORAL_TABLET | Freq: Two times a day (BID) | ORAL | Status: DC
  Administered 2021-04-01: 5 mg via ORAL
  Filled 2021-04-01: qty 1

## 2021-04-01 MED ORDER — ROSUVASTATIN CALCIUM 20 MG PO TABS
40.0000 mg | ORAL_TABLET | Freq: Every day | ORAL | Status: DC
  Administered 2021-04-01: 40 mg via ORAL
  Filled 2021-04-01: qty 2

## 2021-04-01 NOTE — Progress Notes (Signed)
Ordered outpatient Echo for further evaluation of new onset atrial fibrillation. Please see discharge summary from today for more information.   Corrin Parker, PA-C 04/01/2021 1:58 PM

## 2021-04-01 NOTE — Discharge Instructions (Addendum)
Medication Changes: - START Eliquis 5mg  twice daily for your atrial fibrillation. - STOP Pletal. - STOP Aspirin. - STOP Simvastatin and START Crestor 40mg  daily for your cholesterol.  Continue all other medications as directed elsewhere on discharge paperwork. _______________  Groin Site Care Refer to this sheet in the next few weeks. These instructions provide you with information on caring for yourself after your procedure. Your caregiver may also give you more specific instructions. Your treatment has been planned according to current medical practices, but problems sometimes occur. Call your caregiver if you have any problems or questions after your procedure. HOME CARE INSTRUCTIONS  You may shower 24 hours after the procedure. Remove the bandage (dressing) and gently wash the site with plain soap and water. Gently pat the site dry.   Do not apply powder or lotion to the site.   Do not sit in a bathtub, swimming pool, or whirlpool for 5 to 7 days.   No bending, squatting, or lifting anything over 10 pounds (4.5 kg) as directed by your caregiver.   Inspect the site at least twice daily.   Do not drive home if you are discharged the same day of the procedure. Have someone else drive you.   You may drive 24 hours after the procedure unless otherwise instructed by your caregiver.  What to expect:  Any bruising will usually fade within 1 to 2 weeks.   Blood that collects in the tissue (hematoma) may be painful to the touch. It should usually decrease in size and tenderness within 1 to 2 weeks.  SEEK IMMEDIATE MEDICAL CARE IF:  You have unusual pain at the groin site or down the affected leg.   You have redness, warmth, swelling, or pain at the groin site.   You have drainage (other than a small amount of blood on the dressing).   You have chills.   You have a fever or persistent symptoms for more than 72 hours.   You have a fever and your symptoms suddenly get worse.    Your leg becomes pale, cool, tingly, or numb.   You have heavy bleeding from the site. Hold pressure on the site. _______________  Information on my medicine - ELIQUIS (apixaban)  Why was Eliquis prescribed for you? Eliquis was prescribed for you to reduce the risk of a blood clot forming that can cause a stroke if you have a medical condition called atrial fibrillation (a type of irregular heartbeat).  What do You need to know about Eliquis ? Take your Eliquis TWICE DAILY - one tablet in the morning and one tablet in the evening with or without food. If you have difficulty swallowing the tablet whole please discuss with your pharmacist how to take the medication safely.  Take Eliquis exactly as prescribed by your doctor and DO NOT stop taking Eliquis without talking to the doctor who prescribed the medication.  Stopping may increase your risk of developing a stroke.  Refill your prescription before you run out.  After discharge, you should have regular check-up appointments with your healthcare provider that is prescribing your Eliquis.  In the future your dose may need to be changed if your kidney function or weight changes by a significant amount or as you get older.  What do you do if you miss a dose? If you miss a dose, take it as soon as you remember on the same day and resume taking twice daily.  Do not take more than one dose of ELIQUIS  at the same time to make up a missed dose.  Important Safety Information A possible side effect of Eliquis is bleeding. You should call your healthcare provider right away if you experience any of the following: ? Bleeding from an injury or your nose that does not stop. ? Unusual colored urine (red or dark brown) or unusual colored stools (red or black). ? Unusual bruising for unknown reasons. ? A serious fall or if you hit your head (even if there is no bleeding).  Some medicines may interact with Eliquis and might increase your risk  of bleeding or clotting while on Eliquis. To help avoid this, consult your healthcare provider or pharmacist prior to using any new prescription or non-prescription medications, including herbals, vitamins, non-steroidal anti-inflammatory drugs (NSAIDs) and supplements.  This website has more information on Eliquis (apixaban): http://www.eliquis.com/eliquis/home

## 2021-04-01 NOTE — TOC Benefit Eligibility Note (Signed)
Patient Product/process development scientist completed.    The patient is currently admitted and upon discharge could be taking Eliquis 5 mg.  The current 30 day co-pay is, $28.30.   The patient is insured through [employer] D   Roland Earl, CPhT Pharmacy Patient Advocate Specialist [facility] Antimicrobial Stewardship Team Direct Number: [phone]  Fax: [phone]

## 2021-04-01 NOTE — Discharge Summary (Addendum)
Discharge Summary    Patient ID: Margaret Hobbs MRN: [id number]; DOB: [birth year]  Admit date: 03/31/2021 Discharge date: 04/01/2021  PCP:  Cleatis Polka., MD   [facility]  Cardiologist:  Charlton Haws, MD / Nanetta Batty, MD Electrophysiologist:  None   Discharge Diagnoses    Principal Problem:   Atrial fibrillation with RVR [facility]) Active Problems:   Peripheral arterial disease (HCC)   Claudication in peripheral vascular disease (HCC)   Type 2 diabetes mellitus (HCC)   Hyperlipidemia   Essential hypertension   Coronary artery disease involving native coronary artery of native heart without angina pectoris    Diagnostic Studies/Procedures    Peripheral Angiogram 04/01/2021: Angiographic Data:  1: Abdominal aorta-renal arteries widely patent. The infrarenal abdominal aorta was free of significant atherosclerotic changes. 2: Left lower extremity-short segment calcified CTO distal left SFA and above-the-knee popliteal artery with one-vessel runoff via the posterior tibial artery. 3: Right lower extremity-70% fairly focal proximal right SFA stenosis with one-vessel runoff via the posterior tibial artery.  Impression:Margaret Hobbs has a calcified distal left SFA CTO responsible for claudication. She did go in A. fib with RVR at the beginning of the procedure. Because this is a new rhythm for her I elected to abort the intervention. She received 150 mg of amiodarone bolus followed by infusion. She will be admitted to the hospital for rhythm management. The sheath will be removed once ACT falls below 170 and pressure will be held. She will be gently hydrated overnight. Once her rhythm issues have been resolved we will bring her back in a staged fashion to address her distal left SFA CTO. She left the lab in stable condition. 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   Patient was recently seen by Dr. Allyson Sabal on 03/11/2021 for follow-up of PAD. Peripheral angiography and intervention had been discussed at previous visits and patient was now at the point where she wished to proceed with endovascular therapy due to continued claudication. Therefore, outpatient peripheral angiogram was arranged.   Hospital Course     Consultants: None  Patient presented to [facility] on [facility]/2021 for peripheral angiogram. See results of angiogram above. However, intervention was aborted when she went into new onset atrial fibrillation with RVR. Patient reported very minimal chest discomfort during this but was otherwise asymptomatic. Thankfully, she converted back to sinus rhythm with IV Amiodarone. Rates currently in the 50's to 60's. Potassium and Magnesium low at 3.1 and 1.6 respectively. Both repleted. TSH normal. Continue home Toprol-XL 100mg  daily. CHA2DS2-VASc = 7 (CAD, CVA x2, HTN, DM, age, female). Started on Eliquis 5mg  twice daily. Will stop Aspirin now that she is on Eliquis but will continue Plavix. Will also stop Pletal. Will get Echo as outpatient.  Patient on Simvastatin and Zetia for hyperlipidemia. She was initially switched to Lipitor 80mg  daily on admission. However, she states she is unable to tolerate Lipitor. She has been on Crestor in the past and done OK with that. Therefore, will switch to Crestor 40mg  daily at discharge. Will need repeat lipid panel and LFTs in 6-8 weeks.  Patient seen and examined by Dr. today and determined to  be stable for discharge. Outpatient follow-up has been arranged. Medications as below.  Did the patient have an acute coronary syndrome (MI, NSTEMI, STEMI, etc) this admission?:  No                                Did the patient have a percutaneous coronary intervention (stent / angioplasty)?:  No.       _____________  Discharge Vitals Blood pressure (!) 120/45, pulse 78, temperature 97.9 F (36.6 C), temperature source Oral, resp. rate 16, height 5\' 3"  (1.6 m), weight 95.4 kg, SpO2 98 %.  Filed Weights   03/31/21 0707 04/01/21 0355  Weight: 91.2 kg 95.4 kg    Labs & Radiologic Studies    CBC Recent Labs    04/01/21 0251  WBC 6.9  HGB 11.6*  HCT 35.2*  MCV 92.1  PLT 245   Basic Metabolic Panel Recent Labs    16/09/9603/08/22 0251 04/01/21 0704  NA 137  --   K 3.1*  --   CL 102  --   CO2 29  --   GLUCOSE 243*  --   BUN 20  --   CREATININE 1.09*  --   CALCIUM 8.8*  --   MG  --  1.6*   Liver Function Tests No results for input(s): AST, ALT, ALKPHOS, BILITOT, PROT, ALBUMIN in the last 72 hours. No results for input(s): LIPASE, AMYLASE in the last 72 hours. High Sensitivity Troponin:   No results for input(s): TROPONINIHS in the last 720 hours.  BNP Invalid input(s): POCBNP D-Dimer No results for input(s): DDIMER in the last 72 hours. Hemoglobin A1C Recent Labs    04/01/21 0704  HGBA1C 7.2*   Fasting Lipid Panel No results for input(s): CHOL, HDL, LDLCALC, TRIG, CHOLHDL, LDLDIRECT in the last 72 hours. Thyroid Function Tests Recent Labs    04/01/21 0704  TSH 1.254   _____________  PERIPHERAL VASCULAR CATHETERIZATION  Result Date: 03/31/2021  [id number] LOCATION:  [facility] PHYSICIAN: Nanetta BattyJonathan Berry, M.D. 15-Feb-1951 DATE OF PROCEDURE:  03/31/2021 DATE OF DISCHARGE: PV Angiogram/Intervention History obtained from chart review.[name] is a 70 y.o.  moderately overweight widowed Caucasian female mother of 1, grandmother of 4 grandchildren referred to me by Dr. Eden EmmsNishan for peripheral vascular valuation because of symptomatic left calf claudication.I last saw her in the office 01/20/2020.She has a history of remote stroke back in June 2008 and  remote inferior wall myocardial infarction January of the way treated with RCA bare-metal stenting. She has treated hypertension, diabetes and hyperlipidemia. She was complaining of left calf claudication when Dr. Eden EmmsNishan saw her back in February. Lower extremity arterial Doppler studies performed 03/03/2019 revealed a right ABI of 1 and a left ABI 0.7. She did have a high-frequency signal in her proximal right SFA and tibial vessel disease on the left.  Since I saw her last she did have a follow-up Doppler study performed 10/28/2019 revealing a left ABI of 0.57 with an occluded left SFA.   We have talked about angiography and intervention when I saw her a year ago and now she is at the point where she wishes to proceed with endovascular therapy.  Pre Procedure Diagnosis: Peripheral arterial disease Post Procedure Diagnosis: Peripheral arterial disease Operators: Dr. Nanetta BattyJonathan Berry Procedures Performed:  1.  Ultrasound-guided right common femoral access  2.  Abdominal aortogram  3.  Bilateral iliac angiogram  4.  Bifemoral runoff PROCEDURE DESCRIPTION: The patient  was brought to the second floor [place] [facility] lab in the the postabsorptive state. She was premedicated with IV Versed and fentanyl. Her right groin was prepped and shaved in usual sterile fashion. Xylocaine 1% was used for local anesthesia. A 5 French sheath was inserted into the right common femoral artery using standard Seldinger technique.  Ultrasound was used to identify the right common femoral artery and guide access.  A digital image was captured and placed the patient's chart.  A 5 French pigtail catheters placed the distal abdominal aorta.  Distal abdominal aortography, bilateral iliac angiography with bifemoral runoff was performed using bolus chase, digital subtraction and step table technique.  Isovue dye was used for the entirety of the case.  Retrograde aortic pressures monitored in the case. Angiographic Data: 1: Abdominal  aorta-renal arteries widely patent.  The infrarenal abdominal aorta was free of significant atherosclerotic changes. 2: Left lower extremity-short segment calcified CTO distal left SFA and above-the-knee popliteal artery with one-vessel runoff via the posterior tibial artery. 3: Right lower extremity-70% fairly focal proximal right SFA stenosis with one-vessel runoff via the posterior tibial artery.   Margaret Hobbs has a calcified distal left SFA CTO responsible for claudication.  She did go in A. fib with RVR at the beginning of the procedure.  Because this is a new rhythm for her I elected to abort the intervention.  She received 150 mg of amiodarone bolus followed by infusion.  She will be admitted to the hospital for rhythm management.  The sheath will be removed once ACT falls below 170 and pressure will be held.  She will be gently hydrated overnight.  Once her rhythm issues have been resolved we will bring her back in a staged fashion to address her distal left SFA CTO.  She left the lab in stable condition. Nanetta Batty. MD, [facility] 03/31/2021 11:19 AM   Disposition   Patient is being discharged home today in good condition.  Follow-up Plans & Appointments     Follow-up Information    Runell Gess, MD Follow up.   Specialties: Cardiology, Radiology Why: Follow-up already scheduled for 04/15/2021 at 10:45am. Please arrive 15 minutes early for check-in. Contact information: [address] [place] [address] [phone]              Discharge Instructions    Diet - low sodium heart healthy   Complete by: As directed    Increase activity slowly   Complete by: As directed       Discharge Medications   Allergies as of 04/01/2021      Reactions   Remicade [infliximab] Other (See Comments)   Convulsion and felt cold    Sulfa Antibiotics Rash      Medication List    STOP taking these medications   aspirin 81 MG tablet   cilostazol 50 MG tablet Commonly  known as: PLETAL   simvastatin 40 MG tablet Commonly known as: ZOCOR     TAKE these medications   amoxicillin 500 MG capsule Commonly known as: AMOXIL Take 2,000 mg by mouth as directed. Takes 1 prior before dental procedures   apixaban 5 MG Tabs tablet Commonly known as: ELIQUIS Take 1 tablet (5 mg total) by mouth 2 (two) times daily.   cholecalciferol 1000 units tablet Commonly known as: VITAMIN D Take 1,000 Units by mouth daily.   clopidogrel 75 MG tablet Commonly known as: PLAVIX TAKE 1 TABLET BY MOUTH  DAILY   Coenzyme Q10  100 MG Tabs Take 100 mg by mouth daily.   ezetimibe 10 MG tablet Commonly known as: ZETIA Take 10 mg by mouth daily.   folic acid 1 MG tablet Commonly known as: FOLVITE TAKE 2 TABLETS (2MG ) BY  MOUTH IN THE MORNING What changed: See the new instructions.   glipiZIDE-metformin 5-500 MG tablet Commonly known as: METAGLIP Take 1 tablet by mouth 2 (two) times daily before a meal. Start taking on: April 02, 2021   Humira 40 MG/0.4ML Pskt Generic drug: Adalimumab INJECT 40MG  SUBCUTANEOUSLY  EVERY OTHER WEEK What changed: See the new instructions.   hydrALAZINE 50 MG tablet Commonly known as: APRESOLINE TAKE 1 TABLET BY MOUTH IN  THE MORNING AND AT BEDTIME What changed: See the new instructions.   hydrocortisone 10 MG tablet Commonly known as: CORTEF Take 10 mg by mouth daily as needed (Psoriasis).   metoprolol succinate 100 MG 24 hr tablet Commonly known as: TOPROL-XL TAKE 1 TABLET BY MOUTH  DAILY   nitroGLYCERIN 0.4 MG SL tablet Commonly known as: NITROSTAT PLACE 1 TABLET UNDER THE TONGUE EVERY 5 (FIVE) MINUTES AS NEEDED FOR CHEST PAIN (3 DOSES MAX)   olmesartan-hydrochlorothiazide 40-25 MG tablet Commonly known as: BENICAR HCT Take 1 tablet by mouth daily.   potassium chloride 20 MEQ/15ML (10%) Soln Take 20 mEq by mouth daily.   rosuvastatin 40 MG tablet Commonly known as: CRESTOR Take 1 tablet (40 mg total) by mouth daily.    VITAMIN A PO Take 3,000 Units by mouth daily.   vitamin C 500 MG tablet Commonly known as: ASCORBIC ACID Take 500 mg by mouth 3 (three) times a week.   vitamin E 180 MG (400 UNITS) capsule Take 400 Units by mouth daily.          Outstanding Labs/Studies   Consider repeat BMET and Magnesium at follow-up visit.  Duration of Discharge Encounter   Greater than 30 minutes including physician time.  Signed, April 04, 2021, PA-C 04/01/2021, 1:53 PM   I have examined the patient and reviewed assessment and plan and discussed with patient.  Agree with above as stated.   Back in NSR.  Right groin stable. No hematoma.  OK to discharge from a cardiac standpoint.  Check echo.  Can be done as outpatient.  Depending on when PV procedure is rescheduled, adjust anticoagulation timing. Stop Pletal at this point while on Plavix and Eliquis.    Corrin Parker

## 2021-04-01 NOTE — Telephone Encounter (Signed)
PT NOTIFIED, PT WILL AWAIT SCHEDULING CALL

## 2021-04-01 NOTE — Care Management Obs Status (Signed)
MEDICARE OBSERVATION STATUS NOTIFICATION   Patient Details  Name: Margaret Hobbs MRN: [id number] Date of Birth: 06/16/1951   Medicare Observation Status Notification Given:  Yes    Gala Lewandowsky, RN 04/01/2021, 12:04 PM

## 2021-04-01 NOTE — Telephone Encounter (Signed)
-----   Message from Corrin Parker, [place] sent at 04/01/2021  2:01 PM EDT ----- Regarding: Outpatient Echo Patient needs an outpatient Echo for further evaluation of atrial fibrillation. If this can be done before her appointment with Dr. Allyson Sabal on 04/15/2021 that would be great. Order has been placed. Can you please help arrange this?  Thank you! Callie

## 2021-04-01 NOTE — Care Management CC44 (Signed)
Condition Code 44 Documentation Completed  Patient Details  Name: Margaret Hobbs MRN: [id number] Date of Birth: 1951-10-05   Condition Code 44 given:  Yes Patient signature on Condition Code 44 notice:  Yes Documentation of 2 MD's agreement:  Yes Code 44 added to claim:  Yes    Gala Lewandowsky, RN 04/01/2021, 12:04 PM

## 2021-04-01 NOTE — Progress Notes (Addendum)
Progress Note  Patient Name: Margaret Hobbs Date of Encounter: 04/01/2021  [facility] Cardiologist: Charlton Haws, MD   Subjective   No acute overnight events. She is back in sinus rhythm with rates in the 50's to 60's. She states she had very minimal chest discomfort yesterday during the procedure (she was reportedly in atrial fibrillation with RVR) at that time. No chest discomfort since. No shortness of breath or palpitations.  Inpatient Medications    Scheduled Meds: . apixaban  5 mg Oral BID  . cilostazol  50 mg Oral BID  . clopidogrel  75 mg Oral Q breakfast  . ezetimibe  10 mg Oral Daily  . hydrALAZINE  50 mg Oral BID  . irbesartan  300 mg Oral Daily   And  . hydrochlorothiazide  25 mg Oral Daily  . insulin aspart  0-15 Units Subcutaneous TID WC  . metoprolol succinate  100 mg Oral Daily  . potassium chloride  60 mEq Oral Once  . rosuvastatin  40 mg Oral Daily  . sodium chloride flush  3 mL Intravenous Q12H   Continuous Infusions: . sodium chloride     PRN Meds: sodium chloride, acetaminophen, hydrALAZINE, labetalol, morphine injection, nitroGLYCERIN, ondansetron (ZOFRAN) IV, sodium chloride flush   Vital Signs    Vitals:   03/31/21 1955 03/31/21 2122 04/01/21 0007 04/01/21 0355  BP: (!) 145/62 (!) 126/51 (!) 103/42 (!) 121/56  Pulse: 65  60 60  Resp: 16  15 16   Temp: 98.1 F (36.7 C)  98.1 F (36.7 C) 97.9 F (36.6 C)  TempSrc: Oral  Oral Oral  SpO2: 97%  94% 98%  Weight:    95.4 kg  Height:        Intake/Output Summary (Last 24 hours) at 04/01/2021 0747 Last data filed at 04/01/2021 0500 Gross per 24 hour  Intake 812.52 ml  Output 650 ml  Net 162.52 ml   Last 3 Weights 04/01/2021 03/31/2021 03/11/2021  Weight (lbs) 210 lb 6.4 oz 201 lb 203 lb 6.4 oz  Weight (kg) 95.437 kg 91.173 kg 92.262 kg      Telemetry    Sinus rhythm with rates in the 50's to 60's. - Personally Reviewed  ECG    No ECG tracing today. - Personally Reviewed  Physical  Exam   GEN: No acute distress.   Neck: JVD difficult to assess due to body habitus. Cardiac: RRR. No murmurs, rubs, or gallops. Right femoral cath site tender to palpation but soft but no ecchymosis or signs of hematoma. No bruits. Respiratory: Clear to auscultation bilaterally. GI: Soft, obese, non-distended. MS: No lower extremity edema. No deformity. Skin: Warm and dry. Neuro:  No focal deficits. Psych: Normal affect. Responds appropriately.  Labs    High Sensitivity Troponin:  No results for input(s): TROPONINIHS in the last 720 hours.    Chemistry Recent Labs  Lab 04/01/21 0251  NA 137  K 3.1*  CL 102  CO2 29  GLUCOSE 243*  BUN 20  CREATININE 1.09*  CALCIUM 8.8*  GFRNONAA 55*  ANIONGAP 6     Hematology Recent Labs  Lab 04/01/21 0251  WBC 6.9  RBC 3.82*  HGB 11.6*  HCT 35.2*  MCV 92.1  MCH 30.4  MCHC 33.0  RDW 12.6  PLT 245    BNPNo results for input(s): BNP, PROBNP in the last 168 hours.   DDimer No results for input(s): DDIMER in the last 168 hours.   Radiology    PERIPHERAL VASCULAR CATHETERIZATION  Result Date: 03/31/2021  [id number] LOCATION:  FACILITY: [facility] PHYSICIAN: Margaret Hobbs, M.D. 1951/11/14 DATE OF PROCEDURE:  03/31/2021 DATE OF DISCHARGE: PV Angiogram/Intervention History obtained from chart review.Margaret Hobbs is a 70 y.o.  moderately overweight widowed Caucasian female mother of 1, grandmother of 4 grandchildren referred to me by Margaret Hobbs for peripheral vascular valuation because of symptomatic left calf claudication.I last saw her in the office 01/20/2020.She has a history of remote stroke back in June 2008 and remote inferior wall myocardial infarction January of the way treated with RCA bare-metal stenting. She has treated hypertension, diabetes and hyperlipidemia. She was complaining of left calf claudication when Margaret Hobbs saw her back in February. Lower extremity arterial Doppler studies performed 03/03/2019 revealed a right  ABI of 1 and a left ABI 0.7. She did have a high-frequency signal in her proximal right SFA and tibial vessel disease on the left.  Since I saw her last she did have a follow-up Doppler study performed 10/28/2019 revealing a left ABI of 0.57 with an occluded left SFA.   We have talked about angiography and intervention when I saw her a year ago and now she is at the point where she wishes to proceed with endovascular therapy.  Pre Procedure Diagnosis: Peripheral arterial disease Post Procedure Diagnosis: Peripheral arterial disease Operators: Dr. Nanetta Hobbs Procedures Performed:  1.  Ultrasound-guided right common femoral access  2.  Abdominal aortogram  3.  Bilateral iliac angiogram  4.  Bifemoral runoff PROCEDURE DESCRIPTION: The patient was brought to the second floor [facility] cath lab in the the postabsorptive state. She was premedicated with IV Versed and fentanyl. Her right groin was prepped and shaved in usual sterile fashion. Xylocaine 1% was used for local anesthesia. A 5 French sheath was inserted into the right common femoral artery using standard Seldinger technique.  Ultrasound was used to identify the right common femoral artery and guide access.  A digital image was captured and placed the patient's chart.  A 5 French pigtail catheters placed the distal abdominal aorta.  Distal abdominal aortography, bilateral iliac angiography with bifemoral runoff was performed using bolus chase, digital subtraction and step table technique.  Isovue dye was used for the entirety of the case.  Retrograde aortic pressures monitored in the case. Angiographic Data: 1: Abdominal aorta-renal arteries widely patent.  The infrarenal abdominal aorta was free of significant atherosclerotic changes. 2: Left lower extremity-short segment calcified CTO distal left SFA and above-the-knee popliteal artery with one-vessel runoff via the posterior tibial artery. 3: Right lower extremity-70% fairly focal proximal  right SFA stenosis with one-vessel runoff via the posterior tibial artery.   [name] has a calcified distal left SFA CTO responsible for claudication.  She did go in A. fib with RVR at the beginning of the procedure.  Because this is a new rhythm for her I elected to abort the intervention.  She received 150 mg of amiodarone bolus followed by infusion.  She will be admitted to the hospital for rhythm management.  The sheath will be removed once ACT falls below 170 and pressure will be held.  She will be gently hydrated overnight.  Once her rhythm issues have been resolved we will bring her back in a staged fashion to address her distal left SFA CTO.  She left the lab in stable condition. Margaret Hobbs. MD, [facility] 03/31/2021 11:19 AM    Cardiac Studies   Peripheral Angiogram 04/01/2021: Angiographic Data:  1: Abdominal aorta-renal arteries widely patent.  The infrarenal abdominal aorta was free of significant atherosclerotic changes. 2: Left lower extremity-short segment calcified CTO distal left SFA and above-the-knee popliteal artery with one-vessel runoff via the posterior tibial artery. 3: Right lower extremity-70% fairly focal proximal right SFA stenosis with one-vessel runoff via the posterior tibial artery.  Impression: [name] has a calcified distal left SFA CTO responsible for claudication.  She did go in A. fib with RVR at the beginning of the procedure.  Because this is a new rhythm for her I elected to abort the intervention.  She received 150 mg of amiodarone bolus followed by infusion.  She will be admitted to the hospital for rhythm management.  The sheath will be removed once ACT falls below 170 and pressure will be held.  She will be gently hydrated overnight.  Once her rhythm issues have been resolved we will bring her back in a staged fashion to address her distal left SFA CTO.  She left the lab in stable condition.  Patient Profile     70 y.o. female with a history of CAD with  remote inferior MI in 2008 s/p BMS to RCA, PAD with doppler study in 10/2019 showing left ABI of 0.57 with an occluded left SFA, mild bilateral carotid artery stenosis, prior CVA, hypertension, hyperlipidemia, and diabetes mellitus who presented on 03/31/2021 for outpatient peripheral angiogram with Dr. Allyson Sabal. She was found to be in new onset atrial fibrillation with RVR so procedure was aborted.  Assessment & Plan    New Onset Atrial Fibrillation - Converted back to sinus rhythm with IV Amiodarone. Rates in the 50's to 60's. - Potassium 3.1. Will replete.  - Will check Magnesium and TSH. - Will need Echo but this may be able to do this as outpatient. - Continue Toprol-XL100mg  daily.  - CHA2DS2-VASc = 7 (CAD, CVA x2, HTN, DM, age, female). Will start Eliquis 5mg  twice daily. Stop Aspirin.  - Consider monitor as outpatient to assess for atrial fibrillation.  CAD - History of remote inferior MI in 2008 s/p BMS to RCA. - No angina.  - Currently on DAPT with Aspirin and Plavix. Will stop Aspirin for now given we are starting Eliquis. - Continue Toprol-XL 100mg  daily. - Continue high-intensity statin and Zetia.  PAD - Doppler study in 11/202 showed left ABI of 0.57 with an occluded left SFA. - Presented on 4/7 for peripheral angiogram with Dr. 11[birth year]26-1968 but procedure aborted due to new onset atrial fibrillation. - Continue antiplatelet therapy as above.  - Continue Lipitor and Zetia.  Hypertension - BP mostly well controlled (diastolic BP low). - Continue current medications: Hydralazine 50mg  twice daily, Toprol-XL 100mg  daily, HCTZ 25mg  daily, Irbesartan 300mg  daily.   Hyperlipidemia - On Simvastatin and Zetia at home.  - She was started on Lipitor 80mg  daily here but states she has not tolerated this well in the past. Therefore, will switch to Crestor 40mg  daily. - Continue Zetia 10mg  daily.  Type 2 Diabetes Mellitus  - Glucose 243 on BMET this morning. - Home Glipizide-Metformin on  hold. - Will place on sliding scale insulin here.  Disposition: Patient can likely be discharged today. However, MD to follow.  For questions or updates, please contact [facility] Please consult [email or web address] for contact info under        Signed, 6/7, PA-C  04/01/2021, 7:47 AM    I have examined the patient and reviewed assessment and plan and discussed with patient.  Agree with above as stated.  Back in NSR.  Right groin stable. No hematoma.  OK to discharge from a cardiac standpoint.  Check echo.  Can be done as outpatient.  Depending on when PV procedure is rescheduled, consider anticoagulation timing if needed.   Lance MussJayadeep Zayden Maffei

## 2021-04-04 ENCOUNTER — Telehealth: Payer: Self-pay | Admitting: Cardiovascular Disease

## 2021-04-04 DIAGNOSIS — I10 Essential (primary) hypertension: Secondary | ICD-10-CM

## 2021-04-04 NOTE — Telephone Encounter (Signed)
Spoke to pt on the phone regarding recent PV procedure. Per Dr. Allyson Sabal: he was unable to complete the intervention due to the patient converting to rapid a-fib that was later treated in the holding area. Called pt to re-schedule intervention. Pt states that she is not ready to make this appointment at this time. Pt would like to discuss dates with her son so that he can be present for procedure. Pt states that she will make her follow up appointment with Dr. Allyson Sabal but would possibly like to push this procedure for a couple of months. Explained to pt that this is an elective procedure so we can move forward when she is ready. Informed the pt that she must be seen in the office within 30 days of procedure.  Pt also asks if ultrasound is necessary since intervention was not done, able to cancel this appointment for pt. Pt verbalizes understanding and states that she will be in touch about when she is ready to re-schedule pv procedure.

## 2021-04-04 NOTE — Telephone Encounter (Signed)
Patient called and wanted to talk with Dr. Allyson Sabal or nurse regarding her recent surgery. Please call back

## 2021-04-05 MED ORDER — METOPROLOL SUCCINATE ER 50 MG PO TB24
50.0000 mg | ORAL_TABLET | Freq: Every day | ORAL | 3 refills | Status: DC
Start: 2021-04-05 — End: 2021-11-29

## 2021-04-05 MED ORDER — AMIODARONE HCL 200 MG PO TABS: ORAL_TABLET | ORAL | 0 refills | Status: DC

## 2021-04-05 NOTE — Telephone Encounter (Signed)
Per Dr. Eden Emms, instructed patient to DECREASE TOPROL to 50 mg daily. She will START AMIODARONE 200 mg BID for a week then decrease frequency to once daily. She will keep appointment as scheduled with Dr. Allyson Sabal 4/22. She will discuss rescheduling her peripheral intervention at that visit as well. She was grateful for call and agrees with plan.

## 2021-04-07 ENCOUNTER — Ambulatory Visit (HOSPITAL_COMMUNITY): Payer: Medicare Other

## 2021-04-14 ENCOUNTER — Other Ambulatory Visit: Payer: Self-pay

## 2021-04-14 ENCOUNTER — Ambulatory Visit (HOSPITAL_COMMUNITY): Payer: Medicare Other | Attending: Cardiology

## 2021-04-14 DIAGNOSIS — I4891 Unspecified atrial fibrillation: Secondary | ICD-10-CM | POA: Diagnosis not present

## 2021-04-14 LAB — ECHOCARDIOGRAM COMPLETE
Area-P 1/2: 3.48 cm2
S' Lateral: 2.5 cm

## 2021-04-14 MED ORDER — PERFLUTREN LIPID MICROSPHERE
1.0000 mL | INTRAVENOUS | Status: AC | PRN
  Administered 2021-04-14: 1 mL via INTRAVENOUS

## 2021-04-15 ENCOUNTER — Encounter: Payer: Self-pay | Admitting: Cardiovascular Disease

## 2021-04-15 ENCOUNTER — Ambulatory Visit: Payer: Medicare Other | Admitting: Cardiovascular Disease

## 2021-04-15 VITALS — BP 120/58 | HR 72 | Ht 62.0 in | Wt 201.0 lb

## 2021-04-15 DIAGNOSIS — I4891 Unspecified atrial fibrillation: Secondary | ICD-10-CM

## 2021-04-15 DIAGNOSIS — I2510 Atherosclerotic heart disease of native coronary artery without angina pectoris: Secondary | ICD-10-CM | POA: Diagnosis not present

## 2021-04-15 DIAGNOSIS — I10 Essential (primary) hypertension: Secondary | ICD-10-CM | POA: Diagnosis not present

## 2021-04-15 DIAGNOSIS — I739 Peripheral vascular disease, unspecified: Secondary | ICD-10-CM

## 2021-04-15 NOTE — Progress Notes (Signed)
04/15/2021 Margaret Hobbs   1951-09-06  [id number]  Primary Physician Cleatis Polka., MD Primary Cardiologist: Runell Gess MD Nicholes Calamity, [place]  HPI:  Margaret Hobbs is a 70 y.o.  moderately overweight widowed Caucasian female mother of 1, grandmother of 4 grandchildren referred to me by Dr. Eden Emms for peripheral vascular valuation because of symptomatic left calf claudication.I last saw her in the office  03/11/2021.She has a history of remote stroke back in June 2008 and remote inferior wall myocardial infarction January of the way treated with RCA bare-metal stenting. She has treated hypertension, diabetes and hyperlipidemia. She was complaining of left calf claudication when Dr. Eden Emms saw her back in February. Lower extremity arterial Doppler studies performed 03/03/2019 revealed a right ABI of 1 and a left ABI 0.7. She did have a high-frequency signal in her proximal right SFA and tibial vessel disease on the left.  Doppler study performed 10/28/2019 revealing a left ABI of 0.57 with an occluded left SFA.   We have talked about angiography and intervention when I saw her a year ago and now she is at the point where she wishes to proceed with endovascular therapy.  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  She did have a 70% segmental proximal right SFA stenosis.  She is more symptomatic on the left.  Unfortunately, she did go into A. fib with RVR during the procedure and this was terminated prior to performing intervention.  She was discharged home the following day.  Her 2D echo revealed normal LV function.  She wishes to proceed with endovascular therapy of her CTO for lifestyle of any claudication.   Current Meds  Medication Sig  . amiodarone (PACERONE) 200 MG tablet Take 200 mg twice daily for 1 week only then decrease to 200 mg once daily.  [name] amoxicillin (AMOXIL)  500 MG capsule Take 2,000 mg by mouth as directed. Takes 1 prior before dental procedures  . apixaban (ELIQUIS) 5 MG TABS tablet Take 1 tablet (5 mg total) by mouth 2 (two) times daily.  . Ascorbic Acid (VITAMIN C) 500 MG tablet Take 500 mg by mouth 3 (three) times a week.  . cholecalciferol (VITAMIN D) 1000 UNITS tablet Take 1,000 Units by mouth daily.  . clopidogrel (PLAVIX) 75 MG tablet TAKE 1 TABLET BY MOUTH  DAILY  . Coenzyme Q10 100 MG TABS Take 100 mg by mouth daily.  [name] ezetimibe (ZETIA) 10 MG tablet Take 10 mg by mouth daily.  . folic acid (FOLVITE) 1 MG tablet TAKE 2 TABLETS (2MG ) BY  MOUTH IN THE MORNING  . glipiZIDE-metformin (METAGLIP) 5-500 MG tablet Take 1 tablet by mouth 2 (two) times daily before a meal.  . HUMIRA 40 MG/0.4ML PSKT INJECT 40MG  SUBCUTANEOUSLY  EVERY OTHER WEEK (Patient taking differently: INJECT 40MG  SUBCUTANEOUSLY  EVERY OTHER WEEK)  . hydrALAZINE (APRESOLINE) 50 MG tablet TAKE 1 TABLET BY MOUTH IN  THE MORNING AND AT BEDTIME  . hydrocortisone (CORTEF) 10 MG tablet Take 10 mg by mouth daily as needed (Psoriasis).  . metoprolol succinate (TOPROL-XL) 50 MG 24 hr tablet Take 1 tablet (50 mg total) by mouth daily. Take with or immediately following a meal.  . nitroGLYCERIN (NITROSTAT) 0.4 MG SL tablet PLACE 1 TABLET UNDER THE TONGUE EVERY 5 (FIVE) MINUTES AS NEEDED FOR CHEST PAIN (3 DOSES MAX)  . olmesartan-hydrochlorothiazide (BENICAR HCT) 40-25 MG tablet Take 1 tablet  by mouth daily.   . potassium chloride 20 MEQ/15ML (10%) SOLN Take 20 mEq by mouth daily.  . rosuvastatin (CRESTOR) 40 MG tablet Take 1 tablet (40 mg total) by mouth daily.  [name] VITAMIN A PO Take 3,000 Units by mouth daily.  . vitamin E 400 UNIT capsule Take 400 Units by mouth daily.     Allergies  Allergen Reactions  . Remicade [Infliximab] Other (See Comments)    Convulsion and felt cold   . Sulfa Antibiotics Rash    Social History   Socioeconomic History  . Marital status: Widowed    Spouse  name: Not on file  . Number of children: 1  . Years of education: Not on file  . Highest education level: Not on file  Occupational History  . Not on file  Tobacco Use  . Smoking status: Never Smoker  . Smokeless tobacco: Never Used  Vaping Use  . Vaping Use: Never used  Substance and Sexual Activity  . Alcohol use: No  . Drug use: Never  . Sexual activity: Not on file  Other Topics Concern  . Not on file  Social History Narrative  . Not on file   Social Determinants of Health   Financial Resource Strain: Not on file  Food Insecurity: Not on file  Transportation Needs: Not on file  Physical Activity: Not on file  Stress: Not on file  Social Connections: Not on file  Intimate Partner Violence: Not on file     Review of Systems: General: negative for chills, fever, night sweats or weight changes.  Cardiovascular: negative for chest pain, dyspnea on exertion, edema, orthopnea, palpitations, paroxysmal nocturnal dyspnea or shortness of breath Dermatological: negative for rash Respiratory: negative for cough or wheezing Urologic: negative for hematuria Abdominal: negative for nausea, vomiting, diarrhea, bright red blood per rectum, melena, or hematemesis Neurologic: negative for visual changes, syncope, or dizziness All other systems reviewed and are otherwise negative except as noted above.    Blood pressure (!) 120/58, pulse 72, height 5\' 2"  (1.575 m), weight 201 lb (91.2 kg).  General appearance: alert and no distress Neck: no adenopathy, no carotid bruit, no JVD, supple, symmetrical, trachea midline and thyroid not enlarged, symmetric, no tenderness/mass/nodules Lungs: clear to auscultation bilaterally Heart: regular rate and rhythm, S1, S2 normal, no murmur, click, rub or gallop Extremities: extremities normal, atraumatic, no cyanosis or edema Pulses: 2+ and symmetric Absent left pedal pulse Skin: Skin color, texture, turgor normal. No rashes or lesions Neurologic:  Alert and oriented X 3, normal strength and tone. Normal symmetric reflexes. Normal coordination and gait  EKG sinus rhythm at 72 with occasional PVCs and poor R wave progression.  I personally reviewed this EKG.  ASSESSMENT AND PLAN:   Claudication in peripheral vascular disease (HCC) History of PAD with left calf claudication status post angiography/7/22 revealing short segment CTO mid to distal left SFA which was calcified.  She had one-vessel runoff via the posterior tibial artery.  Fortunately, she went into A. fib with RVR during the procedure and the intervention was terminated.  She was discharged home the following day on Eliquis oral anticoagulation.  She wishes to proceed with endovascular therapy which we will schedule sometime in the next month.      MD FACP,FACC,FAHA, [facility] 04/15/2021 10:54 AM

## 2021-04-15 NOTE — Patient Instructions (Signed)
Medication Instructions:  Your physician recommends that you continue on your current medications as directed. Please refer to the Current Medication list given to you today.  *If you need a refill on your cardiac medications before your next appointment, please call your pharmacy*  Follow-Up: At [facility], you and your health needs are our priority.  As part of our continuing mission to provide you with exceptional heart care, we have created designated Provider Care Teams.  These Care Teams include your primary Cardiologist (physician) and Advanced Practice Providers (APPs -  Physician Assistants and Nurse Practitioners) who all work together to provide you with the care you need, when you need it.  We recommend signing up for the patient portal called "MyChart".  Sign up information is provided on this After Visit Summary.  MyChart is used to connect with patients for Virtual Visits (Telemedicine).  Patients are able to view lab/test results, encounter notes, upcoming appointments, etc.  Non-urgent messages can be sent to your provider as well.   To learn more about what you can do with MyChart, go to [email or web address].    Your next appointment:   No future appointments made at this time.   Provider:   Nanetta Batty, MD   Other Instructions Please give Carma Lair, RN a call when you are ready to schedule your PV angiogram.

## 2021-04-15 NOTE — Assessment & Plan Note (Signed)
History of PAD with left calf claudication status post angiography/7/22 revealing short segment CTO mid to distal left SFA which was calcified.  She had one-vessel runoff via the posterior tibial artery.  Fortunately, she went into A. fib with RVR during the procedure and the intervention was terminated.  She was discharged home the following day on Eliquis oral anticoagulation.  She wishes to proceed with endovascular therapy which we will schedule sometime in the next month.

## 2021-04-18 ENCOUNTER — Telehealth: Payer: Self-pay | Admitting: Cardiovascular Disease

## 2021-04-18 DIAGNOSIS — I739 Peripheral vascular disease, unspecified: Secondary | ICD-10-CM

## 2021-04-18 DIAGNOSIS — I4891 Unspecified atrial fibrillation: Secondary | ICD-10-CM

## 2021-04-18 NOTE — Telephone Encounter (Signed)
Spoke to pt. She state she was requesting to speak to [place] because she want to proceed with PV procedure.  Will forward to Nurse

## 2021-04-18 NOTE — Telephone Encounter (Signed)
Patient called to speak with the nurse bout the surgery she needs done by the 19th May.

## 2021-04-19 ENCOUNTER — Other Ambulatory Visit: Payer: Self-pay | Admitting: Physician Assistant

## 2021-04-19 DIAGNOSIS — L4050 Arthropathic psoriasis, unspecified: Secondary | ICD-10-CM

## 2021-04-19 NOTE — Telephone Encounter (Signed)
Next Visit: 05/05/2021  Last Visit: 01/06/2021  Last Fill: 02/15/2021  DX: Psoriatic arthritis   Current Dose per office note 01/06/2021, Humira 40 mg sq injections every 14 days  Labs: 04/01/2021, RBC 3.82, hemoglobin 11.6, HCT 35.2, Potassium 3.1, Glucose 243, Creatinine 1.09, Calcium 8.8, GFR 55  TB Gold: 06/25/2020, negative  Okay to refill Humira?

## 2021-04-20 ENCOUNTER — Other Ambulatory Visit: Payer: Self-pay

## 2021-04-20 DIAGNOSIS — I739 Peripheral vascular disease, unspecified: Secondary | ICD-10-CM

## 2021-04-20 MED ORDER — SODIUM CHLORIDE 0.9% FLUSH: 3.0000 mL | Freq: Two times a day (BID) | INTRAVENOUS | Status: AC

## 2021-04-20 NOTE — Telephone Encounter (Signed)
Spoke with pt regarding scheduling her PV procedure. Reviewed all pre-procedure instructions with pt including lab work and covid swab.  All questions answered for pt. Pt verbalizes understanding.

## 2021-04-21 NOTE — Progress Notes (Signed)
Office Visit Note  Patient: Margaret Hobbs             Date of Birth: [birth year]           MRN: [id number]             PCP: Cleatis Polka., MD Referring: Cleatis Polka., MD Visit Date: 05/05/2021 Occupation: @  Subjective:  Fatigue and lightheadedness   History of Present Illness: Margaret Hobbs is a 70 y.o. female with history of psoriatic arthritis, osteoarthritis, and osteoporosis.  She is currently on Humira 40 mg subcutaneous injections every 14 days.  She has not missed any doses of Humira recently.  She denies any recent psoriatic arthritis flares.  She continues to experience intermittent arthralgias and joint stiffness.  She denies any joint swelling at this time.  She denies any Achilles tendinitis or plantar fasciitis.  She is not currently having any SI joint pain.  She continues to have psoriasis behind her ears and in her ear canal.  She has been using a topical over-the-counter product which helps with the itching and scaling. She denies any recent infections.   Activities of Daily Living:  Patient reports morning stiffness for 0 none.   Patient Denies nocturnal pain.  Difficulty dressing/grooming: Denies Difficulty climbing stairs: Reports Difficulty getting out of chair: Denies Difficulty using hands for taps, buttons, cutlery, and/or writing: Reports  Review of Systems  Constitutional: Positive for fatigue.  HENT: Negative for mouth sores, mouth dryness and nose dryness.   Eyes: Negative for pain, visual disturbance and dryness.  Respiratory: Negative for cough, hemoptysis, shortness of breath and difficulty breathing.   Cardiovascular: Positive for swelling in legs/feet. Negative for chest pain, palpitations and hypertension.  Gastrointestinal: Positive for constipation and diarrhea. Negative for blood in stool.  Endocrine: Negative for increased urination.  Genitourinary: Negative for difficulty urinating and painful urination.   Musculoskeletal: Positive for arthralgias, gait problem and joint pain. Negative for joint swelling, myalgias, muscle weakness, morning stiffness, muscle tenderness and myalgias.  Skin: Negative for color change, pallor, rash, hair loss, nodules/bumps, skin tightness, ulcers and sensitivity to sunlight.  Allergic/Immunologic: Negative for susceptible to infections.  Neurological: Positive for light-headedness and weakness. Negative for dizziness, numbness and headaches.  Hematological: Negative for bruising/bleeding tendency and swollen glands.  Psychiatric/Behavioral: Positive for sleep disturbance. Negative for depressed mood. The patient is not nervous/anxious.     PMFS History:  Patient Active Problem List   Diagnosis Date Noted  . Pseudophakia of both eyes 04/28/2021  . Atrial fibrillation with RVR (HCC) 04/01/2021  . Claudication in peripheral vascular disease (HCC) 03/31/2021  . 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 Round hole of right eye 05/06/2020  . Severe nonproliferative diabetic retinopathy of left eye (HCC) 05/06/2020  . Retinal hemorrhage of left eye 05/06/2020  . Peripheral arterial disease (HCC) 10/17/2019  . Carotid artery disease (HCC) 01/24/2017  . Primary osteoarthritis of both knees 11/29/2016  . Other psoriasis 11/28/2016  . Psoriatic arthritis (HCC) 11/28/2016  . High risk medication use 11/28/2016  . Type 2 diabetes mellitus (HCC) 03/30/2009  . Hyperlipidemia 03/30/2009  . Dyslipidemia 03/30/2009  . Essential hypertension 03/30/2009  . MYOCARDIAL INFARCTION 03/30/2009  . Coronary artery disease involving native coronary artery of native heart without angina pectoris 03/30/2009  . Cerebral artery occlusion with cerebral infarction (HCC) 03/30/2009  . URINARY TRACT INFECTION 03/30/2009  . ARTHRITIS 03/30/2009  . HYPERPARATHYROIDISM, HX OF 03/30/2009    Past  Medical History:  Diagnosis Date  . Aphasia   . Arthritis    . CAD (coronary artery disease)   . Cerebrovascular disease   . CVA (cerebral vascular accident) (HCC)   . Depressed   . Diabetes mellitus   . DM2 (diabetes mellitus, type 2) (HCC)   . HLD (hyperlipidemia)   . HTN (hypertension)   . Hypercholesteremia   . Hyperparathyroidism   . MI (myocardial infarction) (HCC)   . Osteoporosis   . TIA (transient ischemic attack)   . UTI (urinary tract infection)     Family History  Problem Relation Age of Onset  . Heart disease Mother        pacemaker  . Hypertension Mother   . Hyperlipidemia Mother   . Diabetes Brother        pre-diabetic   . Hypertension Son   . Sleep apnea Son   . Diabetes Other   . Hypertension Other   . Alcohol abuse Other   . Coronary artery disease Other    Past Surgical History:  Procedure Laterality Date  . ABDOMINAL AORTOGRAM W/LOWER EXTREMITY Bilateral 03/31/2021   Procedure: ABDOMINAL AORTOGRAM W/LOWER EXTREMITY;  Surgeon: Runell Gess, MD;  Location: [facility] INVASIVE CV LAB;  Service: Cardiovascular;  Laterality: Bilateral;  . ABDOMINAL HYSTERECTOMY    . CATARACT EXTRACTION W/PHACO Bilateral 2017   Dr. Alben Spittle  . hysterectomy - unknown type     Social History   Social History Narrative  . Not on file   Immunization History  Administered Date(s) Administered  . PFIZER(Purple Top)SARS-COV-2 Vaccination 02/16/2020, 03/08/2020, 11/05/2020     Objective: Vital Signs: BP (!) 107/56 (BP Location: Left Arm, Patient Position: Sitting, Cuff Size: Normal)   Pulse 64   Resp 16   Ht  (1.575 m)   Wt 199 lb (90.3 kg)   BMI 36.40 kg/m    Physical Exam Vitals and nursing note reviewed.  Constitutional:      Appearance: She is well-developed.  HENT:     Head: Normocephalic and atraumatic.  Eyes:     Conjunctiva/sclera: Conjunctivae normal.  Pulmonary:     Effort: Pulmonary effort is normal.  Abdominal:     Palpations: Abdomen is soft.  Musculoskeletal:     Cervical back: Normal range of motion.   Skin:    General: Skin is warm and dry.     Capillary Refill: Capillary refill takes less than 2 seconds.     Comments: Small patches of psoriasis behind both ears as well as in the ear canal bilaterally.  Neurological:     Mental Status: She is alert and oriented to person, place, and time.     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  Psychiatric:        Behavior: Behavior normal.      Musculoskeletal Exam: C-spine has good range of motion with no discomfort.  No midline spinal tenderness or SI joint tenderness.  Elbow joints have slightly limited range of motion with discomfort bilaterally.  Elbow joints, wrist joints, MCPs, PIPs, DIPs have good range of motion with no synovitis.  Tenderness over the left CMC joint.  Mild PIP and DIP thickening but no inflammation was noted.  She was able to make a complete fist bilaterally.  Hip joints have good range of motion.  Knee joints have good range of motion with no warmth or effusion.  Ankle joints have good range of motion with no tenderness or joint swelling.  CDAI Exam: CDAI Score: --  Patient Global: --; Provider Global: -- Swollen: --; Tender: -- Joint Exam 05/05/2021   No joint exam has been documented for this visit   There is currently no information documented on the homunculus. Go to the Rheumatology activity and complete the homunculus joint exam.  Investigation: No additional findings.  Imaging: ECHOCARDIOGRAM COMPLETE  Result Date: 04/14/2021    ECHOCARDIOGRAM REPORT   Patient Name:   KABRIA HETZER Date of Exam: 04/14/2021 Medical Rec #:  [id number]            Height:       63.0 in Accession #:    [id number]           Weight:       210.4 lb Date of Birth:  28-May-1951            BSA:          1.976 m Patient Age:    69 years             BP:           123/65 mmHg Patient Gender: F                    HR:           70 bpm. Exam Location:  [address] Procedure: 2D Echo, Cardiac Doppler, Color Doppler and  Intracardiac            Opacification Agent Indications:    I48.91 Atrial fibrillation  History:        Patient has no prior history of Echocardiogram examinations.                 Previous Myocardial Infarction and CAD, PAD and PVD,                 Arrythmias:Atrial Fibrillation; Risk Factors:Hypertension,                 Diabetes and Dyslipidemia.  Sonographer:    Garald Braver, RDCS Referring Phys: [id number] Corrin Parker  Sonographer Comments: Technically difficult study due to poor echo windows and suboptimal apical window. Image acquisition challenging due to patient body habitus. IMPRESSIONS  1. Left ventricular ejection fraction, by estimation, is 60 to 65%. The left ventricle has normal function. The left ventricle has no regional wall motion abnormalities. Left ventricular diastolic parameters are consistent with Grade I diastolic dysfunction (impaired relaxation).  2. Right ventricular systolic function is normal. The right ventricular size is normal.  3. The mitral valve is normal in structure. Trivial mitral valve regurgitation.  4. The aortic valve is tricuspid. There is mild calcification of the aortic valve. There is mild thickening of the aortic valve. Aortic valve regurgitation is not visualized. Mild aortic valve sclerosis is present, with no evidence of aortic valve stenosis.  5. The inferior vena cava is normal in size with greater than 50% respiratory variability, suggesting right atrial pressure of 3 mmHg. Comparison(s): No prior Echocardiogram. FINDINGS  Left Ventricle: Left ventricular ejection fraction, by estimation, is 60 to 65%. The left ventricle has normal function. The left ventricle has no regional wall motion abnormalities. Definity contrast agent was given IV to delineate the left ventricular  endocardial borders. The left ventricular internal cavity size was normal in size. There is no left ventricular hypertrophy. Left ventricular diastolic parameters are consistent with Grade  I diastolic dysfunction (impaired relaxation). Right Ventricle: The right ventricular size is normal. No increase in right ventricular wall thickness. Right  ventricular systolic function is normal. Left Atrium: Left atrial size was normal in size. Right Atrium: Right atrial size was normal in size. Pericardium: There is no evidence of pericardial effusion. Mitral Valve: The mitral valve is normal in structure. There is mild thickening of the mitral valve leaflet(s). There is mild calcification of the mitral valve leaflet(s). Mild mitral annular calcification. Trivial mitral valve regurgitation. Tricuspid Valve: The tricuspid valve is normal in structure. Tricuspid valve regurgitation is trivial. Aortic Valve: The aortic valve is tricuspid. There is mild calcification of the aortic valve. There is mild thickening of the aortic valve. Aortic valve regurgitation is not visualized. Mild aortic valve sclerosis is present, with no evidence of aortic valve stenosis. Pulmonic Valve: The pulmonic valve was normal in structure. Pulmonic valve regurgitation is trivial. Aorta: The aortic root and ascending aorta are structurally normal, with no evidence of dilitation. Venous: The inferior vena cava is normal in size with greater than 50% respiratory variability, suggesting right atrial pressure of 3 mmHg. IAS/Shunts: No atrial level shunt detected by color flow Doppler.  LEFT VENTRICLE PLAX 2D LVIDd:         3.60 cm  Diastology LVIDs:         2.50 cm  LV e' medial:    9.60 cm/s LV PW:         1.00 cm  LV E/e' medial:  9.1 LV IVS:        0.90 cm  LV e' lateral:   7.80 cm/s LVOT diam:     2.00 cm  LV E/e' lateral: 11.2 LV SV:         66 LV SV Index:   33 LVOT Area:     3.14 cm  RIGHT VENTRICLE RV Basal diam:  3.30 cm RV S prime:     12.70 cm/s TAPSE (M-mode): 3.3 cm LEFT ATRIUM           Index       RIGHT ATRIUM           Index LA diam:      3.60 cm 1.82 cm/m  RA Pressure: 3.00 mmHg LA Vol (A2C): 22.3 ml 11.29 ml/m RA Area:      10.40 cm LA Vol (A4C): 47.1 ml 23.84 ml/m RA Volume:   18.60 ml  9.41 ml/m  AORTIC VALVE LVOT Vmax:   85.50 cm/s LVOT Vmean:  55.000 cm/s LVOT VTI:    0.210 m  AORTA Ao Root diam: 3.20 cm Ao Asc diam:  3.00 cm MITRAL VALVE                TRICUSPID VALVE                             Estimated RAP:  3.00 mmHg  MV E velocity: 87.00 cm/s   SHUNTS MV A velocity: 101.00 cm/s  Systemic VTI:  0.21 m MV E/A ratio:  0.86         Systemic Diam: 2.00 cm Laurance Flatten MD Electronically signed by Laurance Flatten MD Signature Date/Time: 04/14/2021/12:42:49 PM    Final    OCT, Retina - OU - Both Eyes  Result Date: 04/28/2021 Right Eye Quality was good. Scan locations included subfoveal. Central Foveal Thickness: 249. Progression has been stable. Findings include normal foveal contour. Left Eye Quality was good. Scan locations included subfoveal. Central Foveal Thickness: 256. Progression has been stable. Findings include normal foveal contour. Notes No signs of  active maculopathy OU   Recent Labs: Lab Results  Component Value Date   WBC 7.7 05/03/2021   HGB 12.5 05/03/2021   PLT 303 05/03/2021   NA 141 05/03/2021   K 3.3 (L) 05/03/2021   CL 98 05/03/2021   CO2 25 05/03/2021   GLUCOSE 205 (H) 05/03/2021   BUN 32 (H) 05/03/2021   CREATININE 1.71 (H) 05/03/2021   BILITOT 0.4 07/01/2019   ALKPHOS 64 08/01/2016   AST 22 07/01/2019   ALT 19 07/01/2019   PROT 7.3 07/01/2019   ALBUMIN 3.1 (L) 08/01/2016   CALCIUM 9.8 05/03/2021   GFRAA 69 07/01/2019   QFTBGOLDPLUS NEGATIVE 06/25/2020    Speciality Comments: No specialty comments available.  Procedures:  No procedures performed Allergies: Remicade [infliximab] and Sulfa antibiotics   Assessment / Plan:     Visit Diagnoses: Psoriatic arthritis (HCC): She has no synovitis or dactylitis on examination today.  She has not had any recent psoriatic arthritis flares.  She has occasional arthralgias and joint stiffness especially in both feet. She has  not noticed any inflammation or nocturnal pain.  No evidence of Achilles tendinitis or plantar fasciitis were noted.  No SI joint tenderness to palpation.  Overall she is clinically doing well on Humira 40 mg subcutaneous injections every 14 days.  She has been tolerating Humira without any side effects.  She has not had any recent infections.  According to the patient she has not been feeling like herself and has noticed increased fatigue, weakness, and low blood pressure which she attributes to several medication changes by her cardiologist for the management of atrial fibrillation.  She plans on reaching out to her PCP as well as her cardiologist today for further recommendations.  Of note she is scheduled for an abdominal aortogram on 05/12/2021 with Dr. Allyson SabalBerry.   She was advised to notify us if she develops any increased joint pain or joint swelling.  She will follow-up in the office in our office 5 months.  Other psoriasis: She continues to have a few small patches of psoriasis behind both ears as well as in bilateral ear canals.  She has been using over-the-counter topical agent which has been improving the itching sensation.  She will remain on Humira as prescribed.  High risk medication use - Humira 40 mg sq injections every 14 days.  CBC and CMP 04/12/21.  She will be due to update lab work in July and every 3 months to monitor for drug toxicity.  Standing orders for CBC and CMP were placed today.  TB Gold negative on 06/25/2020.  Future order for TB gold was placed today.   - Plan: QuantiFERON-TB Gold Plus, CBC with Differential/Platelet, COMPLETE METABOLIC PANEL WITH GFR She has not had any recent infections.  We discussed the importance of holding Humira if she develops signs or symptoms of an infection and to resume once the infection has completely cleared. She has received 3 Pfizer COVID-19 vaccine doses.  Screening for tuberculosis -Future order for TB gold was placed today.  Plan:  QuantiFERON-TB Gold Plus  Primary osteoarthritis of both knees: She experiences occasional discomfort and stiffness in both knees.  She has no warmth or effusion on examination today.  Age-related osteoporosis without current pathological fracture - Reclast is currently on hold-previously prescribed by Dr. Clelia CroftShaw.  She continues to take vitamin D 1000 units daily.  History of atrial fibrillation:  History of PAD with symptomatic left calf claudication s/p angiography.  She recently went into Afib  with RVR during an abdominal aortogram with lower extremity evaluation on 03/31/21 so the procedure was terminated.  She is rescheduled for an abdominal aortogram on 05/12/2021 with Dr. Allyson Sabal.  Nancy increased fatigue, weakness, and lightheadedness since having the procedure performed.  She attributes the symptoms to medication side effects.  She plans on reaching out to her PCP as well as Dr. Allyson Sabal today for further recommendations.   Her blood pressure was 107/56 today in the office.  She was advised to purchase a blood pressure cuff to start checking her blood pressure at home for close monitoring.    Other medical conditions are listed as follows:  History of coronary artery disease  History of stroke  History of hypercholesterolemia  Dyslipidemia  History of diabetes mellitus  History of hypertension  Cerebral artery occlusion with cerebral infarction [facility])    Orders: Orders Placed This Encounter  Procedures  . QuantiFERON-TB Gold Plus  . CBC with Differential/Platelet  . COMPLETE METABOLIC PANEL WITH GFR   No orders of the defined types were placed in this encounter.    Follow-Up Instructions: Return in about 5 months (around 10/05/2021) for Psoriatic arthritis, Osteoarthritis, Osteoporosis.   Gearldine Bienenstock, PA-C  Note - This record has been created using Dragon software.  Chart creation errors have been sought, but may not always  have been located. Such creation errors do  not reflect on  the standard of medical care.

## 2021-04-25 ENCOUNTER — Encounter (INDEPENDENT_AMBULATORY_CARE_PROVIDER_SITE_OTHER): Payer: Medicare Other | Admitting: Ophthalmology

## 2021-04-26 ENCOUNTER — Encounter (INDEPENDENT_AMBULATORY_CARE_PROVIDER_SITE_OTHER): Payer: Medicare Other | Admitting: Ophthalmology

## 2021-04-28 ENCOUNTER — Ambulatory Visit (INDEPENDENT_AMBULATORY_CARE_PROVIDER_SITE_OTHER): Payer: Medicare Other | Admitting: Ophthalmology

## 2021-04-28 ENCOUNTER — Other Ambulatory Visit: Payer: Self-pay

## 2021-04-28 ENCOUNTER — Encounter (INDEPENDENT_AMBULATORY_CARE_PROVIDER_SITE_OTHER): Payer: Self-pay | Admitting: Ophthalmology

## 2021-04-28 DIAGNOSIS — Z961 Presence of intraocular lens: Secondary | ICD-10-CM

## 2021-04-28 DIAGNOSIS — E113492 Type 2 diabetes mellitus with severe nonproliferative diabetic retinopathy without macular edema, left eye: Secondary | ICD-10-CM | POA: Diagnosis not present

## 2021-04-28 DIAGNOSIS — E113491 Type 2 diabetes mellitus with severe nonproliferative diabetic retinopathy without macular edema, right eye: Secondary | ICD-10-CM | POA: Diagnosis not present

## 2021-04-28 NOTE — Assessment & Plan Note (Signed)
Stable overall less active disease

## 2021-04-28 NOTE — Assessment & Plan Note (Signed)
Stable overall in fact may be less active disease

## 2021-04-28 NOTE — Assessment & Plan Note (Signed)
Stable continue to observe

## 2021-04-28 NOTE — Progress Notes (Signed)
04/28/2021     CHIEF COMPLAINT Patient presents for Retina Follow Up (6 Mo F/U OU//Pt denies noticeable changes to [facility] O[facility] since last visit. Pt denies ocular pain, flashes of light, or floaters OU. //)   HISTORY OF PRESENT ILLNESS: Margaret Hobbs is a 70 y.o. female who presents to the clinic today for:   HPI    Retina Follow Up    Diagnosis: Diabetic Retinopathy   Laterality: right eye   Onset: 6 months ago   Severity: mild   Duration: 6 months   Course: stable   Comments: 6 Mo F/U OU  Pt denies noticeable changes to [facility] OU since last visit. Pt denies ocular pain, flashes of light, or floaters OU.          Last edited by Ileana RoupKennerly, Paige, COA on 04/28/2021 10:17 AM. (History)      Referring physician: Cleatis PolkaShaw, William D Jr., MD [address] [place] [id number]  HISTORICAL INFORMATION:   Selected notes from the MEDICAL RECORD NUMBER    Lab Results  Component Value Date   HGBA1C 7.2 (H) 04/01/2021     CURRENT MEDICATIONS: No current outpatient medications on file. (Ophthalmic Drugs)   No current facility-administered medications for this visit. (Ophthalmic Drugs)   Current Outpatient Medications (Other)  Medication Sig  . amiodarone (PACERONE) 200 MG tablet Take 200 mg twice daily for 1 week only then decrease to 200 mg once daily.  [name]. amoxicillin (AMOXIL) 500 MG capsule Take 2,000 mg by mouth as directed. Takes 1 prior before dental procedures  . apixaban (ELIQUIS) 5 MG TABS tablet Take 1 tablet (5 mg total) by mouth 2 (two) times daily.  . Ascorbic Acid (VITAMIN C) 500 MG tablet Take 500 mg by mouth 3 (three) times a week.  . cholecalciferol (VITAMIN D) 1000 UNITS tablet Take 1,000 Units by mouth daily.  . clopidogrel (PLAVIX) 75 MG tablet TAKE 1 TABLET BY MOUTH  DAILY  . Coenzyme Q10 100 MG TABS Take 100 mg by mouth daily.  [name]. ezetimibe (ZETIA) 10 MG tablet Take 10 mg by mouth daily.  . folic acid (FOLVITE) 1 MG tablet TAKE 2 TABLETS (2MG ) BY  MOUTH IN  THE MORNING  . glipiZIDE-metformin (METAGLIP) 5-500 MG tablet Take 1 tablet by mouth 2 (two) times daily before a meal.  . HUMIRA 40 MG/0.4ML PSKT INJECT 40MG  SUBCUTANEOUSLY  EVERY OTHER WEEK  . hydrALAZINE (APRESOLINE) 50 MG tablet TAKE 1 TABLET BY MOUTH IN  THE MORNING AND AT BEDTIME  . hydrocortisone (CORTEF) 10 MG tablet Take 10 mg by mouth daily as needed (Psoriasis).  . metoprolol succinate (TOPROL-XL) 50 MG 24 hr tablet Take 1 tablet (50 mg total) by mouth daily. Take with or immediately following a meal.  . nitroGLYCERIN (NITROSTAT) 0.4 MG SL tablet PLACE 1 TABLET UNDER THE TONGUE EVERY 5 (FIVE) MINUTES AS NEEDED FOR CHEST PAIN (3 DOSES MAX)  . olmesartan-hydrochlorothiazide (BENICAR HCT) 40-25 MG tablet Take 1 tablet by mouth daily.   . potassium chloride 20 MEQ/15ML (10%) SOLN Take 20 mEq by mouth daily.  . rosuvastatin (CRESTOR) 40 MG tablet Take 1 tablet (40 mg total) by mouth daily.  [name]. VITAMIN A PO Take 3,000 Units by mouth daily.  . vitamin E 400 UNIT capsule Take 400 Units by mouth daily.   Current Facility-Administered Medications (Other)  Medication Route  . sodium chloride flush (NS) 0.9 % injection 3 mL Intravenous      REVIEW OF SYSTEMS:  ALLERGIES Allergies  Allergen Reactions  . Remicade [Infliximab] Other (See Comments)    Convulsion and felt cold   . Sulfa Antibiotics Rash    PAST MEDICAL HISTORY Past Medical History:  Diagnosis Date  . Aphasia   . Arthritis   . CAD (coronary artery disease)   . Cerebrovascular disease   . CVA (cerebral vascular accident) (HCC)   . Depressed   . Diabetes mellitus   . DM2 (diabetes mellitus, type 2) (HCC)   . HLD (hyperlipidemia)   . HTN (hypertension)   . Hypercholesteremia   . Hyperparathyroidism   . MI (myocardial infarction) (HCC)   . Osteoporosis   . TIA (transient ischemic attack)   . UTI (urinary tract infection)    Past Surgical History:  Procedure Laterality Date  . ABDOMINAL AORTOGRAM W/LOWER  EXTREMITY Bilateral 03/31/2021   Procedure: ABDOMINAL AORTOGRAM W/LOWER EXTREMITY;  Surgeon: Runell Gess, MD;  Location: [facility] INVASIVE CV LAB;  Service: Cardiovascular;  Laterality: Bilateral;  . ABDOMINAL HYSTERECTOMY    . CATARACT EXTRACTION W/PHACO Bilateral 2017   Dr. Alben Spittle  . hysterectomy - unknown type      FAMILY HISTORY Family History  Problem Relation Age of Onset  . Heart disease Mother        pacemaker  . Hypertension Mother   . Hyperlipidemia Mother   . Diabetes Brother        pre-diabetic   . Hypertension Son   . Sleep apnea Son   . Diabetes Other   . Hypertension Other   . Alcohol abuse Other   . Coronary artery disease Other     SOCIAL HISTORY Social History   Tobacco Use  . Smoking status: Never Smoker  . Smokeless tobacco: Never Used  Vaping Use  . Vaping Use: Never used  Substance Use Topics  . Alcohol use: No  . Drug use: Never         OPHTHALMIC EXAM: Base Eye Exam    Visual Acuity (ETDRS)      Right Left   Dist cc 20/20 20/20   Correction: Glasses       Tonometry (Tonopen, 10:21 AM)      Right Left   Pressure 16 17       Pupils      Pupils Dark Light Shape React APD   Right PERRL 3 2 Round Brisk None   Left PERRL 3 2 Round Brisk None       Visual Fields (Counting fingers)      Left Right    Full Full       Extraocular Movement      Right Left    Full Full       Neuro/Psych    Oriented x3: Yes   Mood/Affect: Normal       Dilation    Both eyes: 1.0% Mydriacyl, 2.5% Phenylephrine @ 10:21 AM        Slit Lamp and Fundus Exam    External Exam      Right Left   External Normal Normal       Slit Lamp Exam      Right Left   Lids/Lashes Normal Normal   Conjunctiva/Sclera White and quiet White and quiet   Cornea Clear Clear   Anterior Chamber Deep and quiet Deep and quiet   Iris Round and reactive Round and reactive   Lens Posterior chamber intraocular lens Posterior chamber intraocular lens   Anterior Vitreous  Normal Normal  Fundus Exam      Right Left   Posterior Vitreous Normal Normal   Disc Normal Normal   C/D Ratio 0.4 0.55   Macula Microaneurysms, no macular thickening Microaneurysms, no macular thickening   Vessels Severe NPDR, no progression Severe NPDR, no progression   Periphery Good local PRP temporal window, Normal          IMAGING AND PROCEDURES  Imaging and Procedures for 04/28/21  OCT, Retina - OU - Both Eyes       Right Eye Quality was good. Scan locations included subfoveal. Central Foveal Thickness: 249. Progression has been stable. Findings include normal foveal contour.   Left Eye Quality was good. Scan locations included subfoveal. Central Foveal Thickness: 256. Progression has been stable. Findings include normal foveal contour.   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 Severe nonproliferative diabetic retinopathy of right eye without macular edema associated with type 2 diabetes mellitus (HCC)  E11.3491 OCT, Retina - OU - Both Eyes  2. Severe nonproliferative diabetic retinopathy of left eye without macular edema associated with type 2 diabetes mellitus (HCC)  E11.3492 OCT, Retina - OU - Both Eyes    1.  No signs of progression of severe nonproliferative diabetic retinopathy OU  2.  No signs of active maculopathy OU  3.  Pseudophakia OU otherwise stable overall  Ophthalmic Meds Ordered this visit:  No orders of the defined types were placed in this encounter.      Return in about 6 months (around 10/29/2021) for DILATE OU, COLOR FP.  There are no Patient Instructions on file for this visit.   Explained the diagnoses, plan, and follow up with the patient and they  expressed understanding.  Patient expressed understanding of the importance of proper follow up care.   Alford Highland Uriyah Raska M.D. Diseases & Surgery of the Retina and Vitreous [facility] 04/28/21     Abbreviations: M myopia (nearsighted); A astigmatism; H hyperopia (farsighted); P presbyopia; Mrx spectacle prescription;  CTL contact lenses; OD right eye; OS left eye; OU both eyes  XT exotropia; ET esotropia; PEK punctate epithelial keratitis; PEE punctate epithelial erosions; DES dry eye syndrome; MGD meibomian gland dysfunction; ATs artificial tears; PFAT's preservative free artificial tears; NSC nuclear sclerotic cataract; PSC posterior subcapsular cataract; ERM epi-retinal membrane; PVD posterior vitreous detachment; RD retinal detachment; DM diabetes mellitus; DR diabetic retinopathy; NPDR non-proliferative diabetic retinopathy; PDR proliferative diabetic retinopathy; CSME clinically significant macular edema; DME diabetic macular edema; dbh dot blot hemorrhages; CWS cotton wool spot; POAG primary open angle glaucoma; C/D cup-to-disc ratio; HVF humphrey visual field; GVF goldmann visual field; OCT optical coherence tomography; IOP intraocular pressure; BRVO Branch retinal vein occlusion; CRVO central retinal vein occlusion; CRAO central retinal artery occlusion; BRAO branch retinal artery occlusion; RT retinal tear; SB scleral buckle; PPV pars plana vitrectomy; VH Vitreous hemorrhage; PRP panretinal laser photocoagulation; IVK intravitreal kenalog; VMT vitreomacular traction; MH Macular hole;  NVD neovascularization of the disc; NVE neovascularization elsewhere; AREDS age related eye disease study; ARMD age related macular degeneration; POAG primary open angle glaucoma; EBMD epithelial/anterior basement membrane dystrophy; ACIOL anterior chamber intraocular lens; IOL intraocular lens; PCIOL posterior  chamber intraocular lens; Phaco/IOL phacoemulsification with intraocular lens placement;  PRK photorefractive keratectomy; LASIK laser assisted in situ keratomileusis; HTN hypertension; DM diabetes mellitus; COPD chronic obstructive pulmonary disease

## 2021-05-02 ENCOUNTER — Other Ambulatory Visit: Payer: Self-pay | Admitting: Cardiovascular Disease

## 2021-05-02 MED ORDER — APIXABAN 5 MG PO TABS: 5.0000 mg | ORAL_TABLET | Freq: Two times a day (BID) | ORAL | 1 refills | Status: DC

## 2021-05-02 MED ORDER — ROSUVASTATIN CALCIUM 40 MG PO TABS: 40.0000 mg | ORAL_TABLET | Freq: Every day | ORAL | 11 refills | Status: DC

## 2021-05-02 MED ORDER — AMIODARONE HCL 200 MG PO TABS: 200.0000 mg | ORAL_TABLET | Freq: Every day | ORAL | 11 refills | Status: DC

## 2021-05-02 NOTE — Telephone Encounter (Signed)
76f, 91.2kg, scr 1.09 04/01/21, lovw/berry 04/15/21

## 2021-05-02 NOTE — Telephone Encounter (Signed)
*  STAT* If patient is at the pharmacy, call can be transferred to refill team.   1. Which medications need to be refilled? (please list name of each medication and dose if known)  apixaban (ELIQUIS) 5 MG TABS tablet rosuvastatin (CRESTOR) 40 MG tablet amiodarone (PACERONE) 200 MG tablet  2. Which pharmacy/location (including street and city if local pharmacy) is medication to be sent to? [id number] - [place], [place] - [address]  3. Do they need a 30 day or 90 day supply?   30 day supply  Patient states she is completely out of medication.

## 2021-05-03 ENCOUNTER — Other Ambulatory Visit: Payer: Self-pay | Admitting: Cardiovascular Disease

## 2021-05-03 ENCOUNTER — Other Ambulatory Visit (HOSPITAL_COMMUNITY): Payer: Medicare Other

## 2021-05-03 ENCOUNTER — Telehealth: Payer: Self-pay | Admitting: *Deleted

## 2021-05-03 LAB — CBC
Hematocrit: 38 % (ref 34.0–46.6)
Hemoglobin: 12.5 g/dL (ref 11.1–15.9)
MCH: 29.6 pg (ref 26.6–33.0)
MCHC: 32.9 g/dL (ref 31.5–35.7)
MCV: 90 fL (ref 79–97)
Platelets: 303 10*3/uL (ref 150–450)
RBC: 4.22 x10E6/uL (ref 3.77–5.28)
RDW: 12 % (ref 11.7–15.4)
WBC: 7.7 10*3/uL (ref 3.4–10.8)

## 2021-05-03 NOTE — Telephone Encounter (Signed)
Labs received from: [facility] Drawn on: 04/12/2021 Reviewed by: Sherron Ales, PC-C  Labs drawn: CBC, CMP  Results: Glucose  202 BUN  25 Potassium 3.3

## 2021-05-04 LAB — BASIC METABOLIC PANEL
BUN/Creatinine Ratio: 19 (ref 12–28)
BUN: 32 mg/dL — ABNORMAL HIGH (ref 8–27)
CO2: 25 mmol/L (ref 20–29)
Calcium: 9.8 mg/dL (ref 8.7–10.3)
Chloride: 98 mmol/L (ref 96–106)
Creatinine, Ser: 1.71 mg/dL — ABNORMAL HIGH (ref 0.57–1.00)
Glucose: 205 mg/dL — ABNORMAL HIGH (ref 65–99)
Potassium: 3.3 mmol/L — ABNORMAL LOW (ref 3.5–5.2)
Sodium: 141 mmol/L (ref 134–144)
eGFR: 32 mL/min/{1.73_m2} — ABNORMAL LOW (ref >59)

## 2021-05-05 ENCOUNTER — Ambulatory Visit: Payer: Medicare Other | Admitting: Physician Assistant

## 2021-05-05 ENCOUNTER — Encounter: Payer: Self-pay | Admitting: Physician Assistant

## 2021-05-05 ENCOUNTER — Other Ambulatory Visit: Payer: Self-pay

## 2021-05-05 VITALS — BP 107/56 | HR 64 | Resp 16 | Ht 62.0 in | Wt 199.0 lb

## 2021-05-05 DIAGNOSIS — I6350 Cerebral infarction due to unspecified occlusion or stenosis of unspecified cerebral artery: Secondary | ICD-10-CM

## 2021-05-05 DIAGNOSIS — L408 Other psoriasis: Secondary | ICD-10-CM

## 2021-05-05 DIAGNOSIS — Z111 Encounter for screening for respiratory tuberculosis: Secondary | ICD-10-CM

## 2021-05-05 DIAGNOSIS — M170 Bilateral primary osteoarthritis of knee: Secondary | ICD-10-CM | POA: Diagnosis not present

## 2021-05-05 DIAGNOSIS — Z8679 Personal history of other diseases of the circulatory system: Secondary | ICD-10-CM

## 2021-05-05 DIAGNOSIS — Z79899 Other long term (current) drug therapy: Secondary | ICD-10-CM | POA: Diagnosis not present

## 2021-05-05 DIAGNOSIS — L4050 Arthropathic psoriasis, unspecified: Secondary | ICD-10-CM | POA: Diagnosis not present

## 2021-05-05 DIAGNOSIS — Z8673 Personal history of transient ischemic attack (TIA), and cerebral infarction without residual deficits: Secondary | ICD-10-CM

## 2021-05-05 DIAGNOSIS — E785 Hyperlipidemia, unspecified: Secondary | ICD-10-CM

## 2021-05-05 DIAGNOSIS — Z8639 Personal history of other endocrine, nutritional and metabolic disease: Secondary | ICD-10-CM

## 2021-05-05 DIAGNOSIS — M810 Age-related osteoporosis without current pathological fracture: Secondary | ICD-10-CM

## 2021-05-05 NOTE — Patient Instructions (Signed)
Standing Labs We placed an order today for your standing lab work.   Please have your standing labs drawn in July and every 3 months   If possible, please have your labs drawn 2 weeks prior to your appointment so that the provider can discuss your results at your appointment.  We have open lab daily Monday through Thursday from 1:30-4:30 PM and Friday from 1:30-4:00 PM at the office of Dr. Shaili Deveshwar, [facility].   Please be advised, all patients with office appointments requiring lab work will take precedents over walk-in lab work.  If possible, please come for your lab work on Monday and Friday afternoons, as you may experience shorter wait times. The office is located at [address], [address], [place], [place] [address] No appointment is necessary.   Labs are drawn by [facility]. Please bring your co-pay at the time of your lab draw.  You may receive a bill from [facility] for your lab work.  If you wish to have your labs drawn at another location, please call the office 24 hours in advance to send orders.  If you have any questions regarding directions or hours of operation,  please call [phone].   As a reminder, please drink plenty of water prior to coming for your lab work. Thanks!     

## 2021-05-06 ENCOUNTER — Telehealth: Payer: Self-pay | Admitting: *Deleted

## 2021-05-06 NOTE — Telephone Encounter (Signed)
Labs received from: [facility]. Drawn on: 04/12/2021 Reviewed by: Sherron Ales, PA-C  Labs drawn: CMP, CBC  Results:  Glucose 202 BUN  25 Potassium 3.3

## 2021-05-09 ENCOUNTER — Other Ambulatory Visit (HOSPITAL_COMMUNITY)
Admission: RE | Admit: 2021-05-09 | Discharge: 2021-05-09 | Disposition: A | Discharge status: Home / Self Care | Payer: Medicare Other | Source: Ambulatory Visit | Attending: Cardiovascular Disease | Admitting: Cardiovascular Disease

## 2021-05-09 DIAGNOSIS — Z01812 Encounter for preprocedural laboratory examination: Secondary | ICD-10-CM | POA: Diagnosis present

## 2021-05-09 DIAGNOSIS — Z20822 Contact with and (suspected) exposure to covid-19: Secondary | ICD-10-CM | POA: Insufficient documentation

## 2021-05-10 ENCOUNTER — Telehealth: Payer: Self-pay

## 2021-05-10 LAB — SARS CORONAVIRUS 2 (TAT 6-24 HRS): SARS Coronavirus 2: NEGATIVE

## 2021-05-10 NOTE — Telephone Encounter (Signed)
Spoke with pt on the phone regarding PV intervention on Thursday 05/12/21. Per Dr. Allyson Sabal procedure needs to be cancelled due to pt's kidney function as well as recent contrast shortage. Pt creatinine went from 1.09 to 1.71. Pt will go on wait list for intervention and will be contacted when we can move forward. Pt verbalizes understanding.

## 2021-05-11 ENCOUNTER — Other Ambulatory Visit: Payer: Self-pay | Admitting: Cardiovascular Disease

## 2021-05-12 ENCOUNTER — Encounter (HOSPITAL_COMMUNITY): Admission: RE | Payer: Self-pay | Source: Home / Self Care

## 2021-05-12 ENCOUNTER — Ambulatory Visit (HOSPITAL_COMMUNITY): Admission: RE | Admit: 2021-05-12 | Payer: Self-pay | Source: Home / Self Care | Admitting: Cardiovascular Disease

## 2021-05-12 SURGERY — ABDOMINAL AORTOGRAM
Anesthesia: LOCAL

## 2021-05-19 ENCOUNTER — Inpatient Hospital Stay (HOSPITAL_COMMUNITY): Admission: RE | Admit: 2021-05-19 | Payer: Medicare Other | Source: Ambulatory Visit

## 2021-05-20 ENCOUNTER — Telehealth: Payer: Self-pay | Admitting: Cardiovascular Disease

## 2021-05-20 NOTE — Telephone Encounter (Signed)
Spoke with patient of Dr. Allyson Sabal - she reports lightheadedness, can't stand straight. The symptoms have been going on since she started amiodarone back mid-April. She does not really check her BP or pulse at home. She reports not being able to do housework, drive to feeling unwell  She would like to stop this medication. Explained that amiodarone has a long half-life, so even if she stops it (once message reviewed by MD), she may not see improvement in symptoms right away.   Will route to MD/RN

## 2021-05-20 NOTE — Telephone Encounter (Signed)
Pt c/o medication issue:  1. Name of Medication: amiodarone (PACERONE) 200 MG tablet  2. How are you currently taking this medication (dosage and times per day)? 1 tablet in the morning  3. Are you having a reaction (difficulty breathing--STAT)? No   4. What is your medication issue? Patient states that the medication is making her loopy unable to stand up straight. Patient would like to stop taking the pills

## 2021-05-20 NOTE — Telephone Encounter (Signed)
Patient aware of MD advice to stop medication. Med list updated

## 2021-05-20 NOTE — Telephone Encounter (Signed)
Okay to stop amiodarone    JJB

## 2021-05-25 ENCOUNTER — Ambulatory Visit: Payer: Medicare Other | Admitting: Cardiovascular Disease

## 2021-05-31 ENCOUNTER — Other Ambulatory Visit: Payer: Self-pay | Admitting: Cardiovascular Disease

## 2021-07-02 NOTE — Progress Notes (Signed)
She CARDIOLOGY CONSULT NOTE       Patient ID: Margaret Hobbs MRN: [id number] DOB/AGE: [birth year] 70 y.o.  Primary Physician: Cleatis Polka., MD Primary Cardiologist: Eden [facility]ms [facility] MD  Allyson Sabal      HPI:  70 y.o. with history of CAD inferior MI 2008 Rx BMS to RCA, HTN, HLD TIA, psoriatic arthritis PAF And PVD. Angiogram 04/01/21 aborted due to PAF calcified CTO distal left SFA with one vessel run off and 70% right SFA.  Converted to NSR with amiodarone Started on eliquis and plavix only for CAD. Amiodarone  D/c  04/15/21 due to dizziness. She is intolerant to lipitor but takes crestor   TTE 04/14/21 EF 60-65% trivial MR AV sclerosis LA 3.6 cm   She felt horrible right away with amiodarone Still with LE claudication using cane Seemed frustrated at lack of response from Dr Hazle Coca office  ROS All other systems reviewed and negative except as noted above  Past Medical History:  Diagnosis Date   Aphasia    Arthritis    CAD (coronary artery disease)    Cerebrovascular disease    CVA (cerebral vascular accident) (HCC)    Depressed    Diabetes mellitus    DM2 (diabetes mellitus, type 2) (HCC)    HLD (hyperlipidemia)    HTN (hypertension)    Hypercholesteremia    Hyperparathyroidism    MI (myocardial infarction) (HCC)    Osteoporosis    TIA (transient ischemic attack)    UTI (urinary tract infection)     Family History  Problem Relation Age of Onset   Heart disease Mother        pacemaker   Hypertension Mother    Hyperlipidemia Mother    Diabetes Brother        pre-diabetic    Hypertension Son    Sleep apnea Son    Diabetes Other    Hypertension Other    Alcohol abuse Other    Coronary artery disease Other     Social History   Socioeconomic History   Marital status: Widowed    Spouse name: Not on file   Number of children: 1   Years of education: Not on file   Highest education level: Not on file  Occupational History   Not on file  Tobacco Use    Smoking status: Never   Smokeless tobacco: Never  Vaping Use   Vaping Use: Never used  Substance and Sexual Activity   Alcohol use: No   Drug use: Never   Sexual activity: Not on file  Other Topics Concern   Not on file  Social History Narrative   Not on file   Social Determinants of Health   Financial Resource Strain: Not on file  Food Insecurity: Not on file  Transportation Needs: Not on file  Physical Activity: Not on file  Stress: Not on file  Social Connections: Not on file  Intimate Partner Violence: Not on file    Past Surgical History:  Procedure Laterality Date   ABDOMINAL AORTOGRAM W/LOWER EXTREMITY Bilateral 03/31/2021   Procedure: ABDOMINAL AORTOGRAM W/LOWER EXTREMITY;  Surgeon: Runell Gess, MD;  Location: [facility] INVASIVE CV LAB;  Service: Cardiovascular;  Laterality: Bilateral;   ABDOMINAL HYSTERECTOMY     CATARACT EXTRACTION W/PHACO Bilateral 2017   Dr. Alben Spittle   hysterectomy - unknown type        Current Outpatient Medications:    amoxicillin (AMOXIL) 500 MG capsule, Take 2,000 mg by mouth as directed. Take 4 capsules (2000  mg) by mouth 1 hour prior to dental procedures, Disp: , Rfl: 0   apixaban (ELIQUIS) 5 MG TABS tablet, Take 1 tablet (5 mg total) by mouth 2 (two) times daily., Disp: 180 tablet, Rfl: 1   Ascorbic Acid (VITAMIN C) 500 MG tablet, Take 500 mg by mouth 3 (three) times a week., Disp: , Rfl:    cholecalciferol (VITAMIN D) 1000 UNITS tablet, Take 1,000 Units by mouth in the morning., Disp: , Rfl:    clopidogrel (PLAVIX) 75 MG tablet, Take 1 tablet (75 mg total) by mouth daily. Please keep upcoming appt in July 2022 with Dr. Eden Emms before anymore refills. Thank you Final Attempt, Disp: 30 tablet, Rfl: 1   Coenzyme Q10 100 MG TABS, Take 100 mg by mouth in the morning., Disp: , Rfl:    ezetimibe (ZETIA) 10 MG tablet, Take 10 mg by mouth in the morning., Disp: , Rfl:    folic acid (FOLVITE) 1 MG tablet, TAKE 2 TABLETS (2MG ) BY  MOUTH IN THE MORNING  (Patient taking differently: Take 1 mg by mouth in the morning.), Disp: 180 tablet, Rfl: 3   glipiZIDE-metformin (METAGLIP) 5-500 MG tablet, Take 1 tablet by mouth 2 (two) times daily before a meal., Disp: , Rfl:    HUMIRA 40 MG/0.4ML PSKT, INJECT 40MG  SUBCUTANEOUSLY  EVERY OTHER WEEK (Patient taking differently: Inject 40 mg into the skin every 14 (fourteen) days. Tuesdays.), Disp: 6 each, Rfl: 0   hydrALAZINE (APRESOLINE) 50 MG tablet, TAKE 1 TABLET BY MOUTH IN  THE MORNING AND AT BEDTIME (Patient taking differently: Take 50 mg by mouth in the morning and at bedtime.), Disp: 180 tablet, Rfl: 3   metoprolol succinate (TOPROL-XL) 50 MG 24 hr tablet, Take 1 tablet (50 mg total) by mouth daily. Take with or immediately following a meal., Disp: 90 tablet, Rfl: 3   nitroGLYCERIN (NITROSTAT) 0.4 MG SL tablet, PLACE 1 TABLET UNDER THE TONGUE EVERY 5 (FIVE) MINUTES AS NEEDED FOR CHEST PAIN (3 DOSES MAX) (Patient taking differently: Place 0.4 mg under the tongue every 5 (five) minutes x 3 doses as needed for chest pain.), Disp: 25 tablet, Rfl: 2   olmesartan-hydrochlorothiazide (BENICAR HCT) 40-25 MG tablet, Take 1 tablet by mouth in the morning., Disp: , Rfl:    potassium chloride SA (KLOR-CON) 20 MEQ tablet, Take 20 mEq by mouth 2 (two) times daily., Disp: , Rfl:    rosuvastatin (CRESTOR) 40 MG tablet, Take 1 tablet (40 mg total) by mouth daily. (Patient taking differently: Take 40 mg by mouth in the morning.), Disp: 30 tablet, Rfl: 11   VITAMIN A PO, Take 3,000 Units by mouth daily., Disp: , Rfl:    vitamin E 400 UNIT capsule, Take 400 Units by mouth in the morning., Disp: , Rfl:   Current Facility-Administered Medications:    sodium chloride flush (NS) 0.9 % injection 3 mL, 3 mL, Intravenous, Q12H, , MD  sodium chloride flush  3 mL Intravenous Q12H     Physical Exam: There were no vitals taken for this visit.    Affect appropriate Overweight female  HEENT: normal Neck supple with  no adenopathy JVP normal no bruits no thyromegaly Lungs clear with no wheezing and good diaphragmatic motion Heart:  S1/S2 no murmur, no rub, gallop or click PMI normal Abdomen: benighn, BS positve, no tenderness, no AAA no bruit.  No HSM or HJR Decreased pulses bilaterally  No edema Neuro non-focal Skin warm and dry No muscular weakness   Labs:  Lab Results  Component Value Date   WBC 7.7 05/03/2021   HGB 12.5 05/03/2021   HCT 38.0 05/03/2021   MCV 90 05/03/2021   PLT 303 05/03/2021   No results for input(s): NA, K, CL, CO2, BUN, CREATININE, CALCIUM, PROT, BILITOT, ALKPHOS, ALT, AST, GLUCOSE in the last 168 hours.  Invalid input(s): LABALBU Lab Results  Component Value Date   CKTOTAL 28 06/03/2007   CKMB 0.7 06/03/2007   TROPONINI 0.02        NO INDICATION OF MYOCARDIAL INJURY. 06/03/2007    Lab Results  Component Value Date   CHOL 138 08/24/2016   CHOL  06/03/2007    116        ATP III CLASSIFICATION:  <200     mg/dL   Desirable  086-578  mg/dL   Borderline High  >=469    mg/dL   High   Lab Results  Component Value Date   HDL 40 (L) 08/24/2016   HDL 40 06/03/2007   Lab Results  Component Value Date   LDLCALC 59 08/24/2016   LDLCALC  06/03/2007    57        Total Cholesterol/HDL:CHD Risk Coronary Heart Disease Risk Table                     Men   Women  1/2 Average Risk   3.4   3.3   Lab Results  Component Value Date   TRIG 197 (H) 08/24/2016   TRIG 94 06/03/2007   Lab Results  Component Value Date   CHOLHDL 3.5 08/24/2016   CHOLHDL 2.9 06/03/2007   No results found for: LDLDIRECT    Radiology: No results found.  EKG: SR rate 72 poor R wave progression 04/15/21    ASSESSMENT AND PLAN:   CAD:  2008 BMS to RCA f/u lexiscan myovue continue plavix and beta blocker PAF:  03/2021 in setting of PV angiogram NSR amiodarone d/c due to dizziness continue Eliquis Monitor to see if still paroxysmal  PVD:  Bilateral SFA dx with CTA left SFA Single  vessel run off bilaterally f/u Dr Allyson Sabal  HLD:  continue crestor  DM:  Discussed low carb diet.  Target hemoglobin A1c is 6.5 or less.  Continue current medications. A1c 7.2   Lexiscan myovue Monitor Refer to EP ? Dofetilide Rx   F/U Dr Allyson Sabal for PVD 3 months    Signed: Charlton Haws 07/02/2021, 3:33 PM

## 2021-07-04 ENCOUNTER — Other Ambulatory Visit: Payer: Self-pay | Admitting: Physician Assistant

## 2021-07-04 DIAGNOSIS — L4050 Arthropathic psoriasis, unspecified: Secondary | ICD-10-CM

## 2021-07-04 NOTE — Telephone Encounter (Signed)
Please have patient come in for TB Gold, CBC and CMP.

## 2021-07-04 NOTE — Telephone Encounter (Signed)
Patient advised to come in for TB Gold, CBC and CMP.

## 2021-07-04 NOTE — Telephone Encounter (Signed)
Next Visit: 10/13/2021  Last Visit: 05/05/2021  Last Fill: 04/19/2021  [id number] arthritis  Current Dose per office note 05/05/2021: Humira 40 mg sq injections every 14 days.   Labs: 04/12/2021 Glucose           202 BUN                 25 Potassium       3.3  TB Gold: 06/25/2020 Neg    Okay to refill Humira?

## 2021-07-05 ENCOUNTER — Encounter: Payer: Self-pay | Admitting: Cardiology

## 2021-07-06 ENCOUNTER — Other Ambulatory Visit: Payer: Self-pay | Admitting: *Deleted

## 2021-07-06 DIAGNOSIS — Z111 Encounter for screening for respiratory tuberculosis: Secondary | ICD-10-CM

## 2021-07-06 DIAGNOSIS — Z79899 Other long term (current) drug therapy: Secondary | ICD-10-CM

## 2021-07-07 NOTE — Progress Notes (Signed)
Creatinine and GFR have returned to WNL.  ALT is borderline elevated.  Please recommend avoid tylenol, NSAID, and alcohol use.  CBC WNL

## 2021-07-08 LAB — QUANTIFERON-TB GOLD PLUS
Mitogen-NIL: >10 IU/mL
NIL: 0.03 IU/mL
QuantiFERON-TB Gold Plus: NEGATIVE
TB1-NIL: 0 IU/mL
TB2-NIL: <0 IU/mL

## 2021-07-08 LAB — COMPLETE METABOLIC PANEL WITH GFR
AG Ratio: 1.3 (calc) (ref 1.0–2.5)
ALT: 34 U/L — ABNORMAL HIGH (ref 6–29)
AST: 25 U/L (ref 10–35)
Albumin: 3.8 g/dL (ref 3.6–5.1)
Alkaline phosphatase (APISO): 38 U/L (ref 37–153)
BUN: 22 mg/dL (ref 7–25)
CO2: 28 mmol/L (ref 20–32)
Calcium: 9.7 mg/dL (ref 8.6–10.4)
Chloride: 104 mmol/L (ref 98–110)
Creat: 0.98 mg/dL (ref 0.50–1.05)
Globulin: 3 g/dL (calc) (ref 1.9–3.7)
Glucose, Bld: 101 mg/dL — ABNORMAL HIGH (ref 65–99)
Potassium: 4.1 mmol/L (ref 3.5–5.3)
Sodium: 141 mmol/L (ref 135–146)
Total Bilirubin: 0.4 mg/dL (ref 0.2–1.2)
Total Protein: 6.8 g/dL (ref 6.1–8.1)
eGFR: 63 mL/min/{1.73_m2} (ref >=60)

## 2021-07-08 LAB — CBC WITH DIFFERENTIAL/PLATELET
Absolute Monocytes: 363 cells/uL (ref 200–950)
Basophils Absolute: 39 cells/uL (ref 0–200)
Basophils Relative: 0.8 %
Eosinophils Absolute: 260 cells/uL (ref 15–500)
Eosinophils Relative: 5.3 %
HCT: 37.2 % (ref 35.0–45.0)
Hemoglobin: 11.9 g/dL (ref 11.7–15.5)
Lymphs Abs: 1367 cells/uL (ref 850–3900)
MCH: 30.3 pg (ref 27.0–33.0)
MCHC: 32 g/dL (ref 32.0–36.0)
MCV: 94.7 fL (ref 80.0–100.0)
MPV: 13.6 fL — ABNORMAL HIGH (ref 7.5–12.5)
Monocytes Relative: 7.4 %
Neutro Abs: 2871 cells/uL (ref 1500–7800)
Neutrophils Relative %: 58.6 %
Platelets: 302 10*3/uL (ref 140–400)
RBC: 3.93 10*6/uL (ref 3.80–5.10)
RDW: 13 % (ref 11.0–15.0)
Total Lymphocyte: 27.9 %
WBC: 4.9 10*3/uL (ref 3.8–10.8)

## 2021-07-11 ENCOUNTER — Ambulatory Visit: Payer: Medicare Other | Admitting: Cardiovascular Disease

## 2021-07-11 ENCOUNTER — Ambulatory Visit (INDEPENDENT_AMBULATORY_CARE_PROVIDER_SITE_OTHER): Payer: Medicare Other

## 2021-07-11 ENCOUNTER — Encounter: Payer: Self-pay | Admitting: Cardiovascular Disease

## 2021-07-11 ENCOUNTER — Other Ambulatory Visit: Payer: Self-pay

## 2021-07-11 VITALS — BP 140/80 | HR 66 | Ht 62.0 in | Wt 195.8 lb

## 2021-07-11 DIAGNOSIS — I4891 Unspecified atrial fibrillation: Secondary | ICD-10-CM | POA: Diagnosis not present

## 2021-07-11 DIAGNOSIS — I10 Essential (primary) hypertension: Secondary | ICD-10-CM | POA: Diagnosis not present

## 2021-07-11 DIAGNOSIS — I2510 Atherosclerotic heart disease of native coronary artery without angina pectoris: Secondary | ICD-10-CM

## 2021-07-11 DIAGNOSIS — Z79899 Other long term (current) drug therapy: Secondary | ICD-10-CM

## 2021-07-11 DIAGNOSIS — I739 Peripheral vascular disease, unspecified: Secondary | ICD-10-CM

## 2021-07-11 NOTE — Progress Notes (Signed)
TB gold negative

## 2021-07-11 NOTE — Progress Notes (Signed)
Pt enrolled for 14 day Zio to be mailed to her home.

## 2021-07-11 NOTE — Progress Notes (Signed)
She can take tylenol very sparingly.  If she is having frequent headaches she should schedule an appointment with her PCP.

## 2021-07-11 NOTE — Progress Notes (Signed)
Placed order for consent.

## 2021-07-11 NOTE — Patient Instructions (Addendum)
Medication Instructions:  Your physician recommends that you continue on your current medications as directed. Please refer to the Current Medication list given to you today.  *If you need a refill on your cardiac medications before your next appointment, please call your pharmacy*   Lab Work: If you have labs (blood work) drawn today and your tests are completely normal, you will receive your results only by: MyChart Message (if you have MyChart) OR A paper copy in the mail If you have any lab test that is abnormal or we need to change your treatment, we will call you to review the results.  Testing/Procedures: Your physician has requested that you have a lexiscan myoview. For further information please visit [email or web address] Please follow instruction sheet, as given.   ZIO XT- Long Term Monitor Instructions  Your physician has requested you wear a ZIO patch monitor for 14 days.  This is a single patch monitor. [employer] supplies one patch monitor per enrollment. Additional stickers are not available. Please do not apply patch if you will be having a Nuclear Stress Test,  Echocardiogram, Cardiac CT, MRI, or Chest Xray during the period you would be wearing the  monitor. The patch cannot be worn during these tests. You cannot remove and re-apply the  ZIO XT patch monitor.  Your ZIO patch monitor will be mailed 3 day USPS to your address on file. It may take 3-5 days  to receive your monitor after you have been enrolled.  Once you have received your monitor, please review the enclosed instructions. Your monitor  has already been registered assigning a specific monitor serial # to you.  Billing and Patient Assistance Program Information  We have supplied [employer] with any of your insurance information on file for billing purposes. [employer] offers a sliding scale Patient Assistance Program for patients that do not have  insurance, or whose insurance does not completely cover the cost of  the ZIO monitor.  You must apply for the Patient Assistance Program to qualify for this discounted rate.  To apply, please call [employer] at [phone], select option 4, select option 2, ask to apply for  Patient Assistance Program. Meredeth Ide will ask your household income, and how many people  are in your household. They will quote your out-of-pocket cost based on that information.  [employer] will also be able to set up a 45-month, interest-free payment plan if needed.  Applying the monitor   Shave hair from upper left chest.  Hold abrader disc by orange tab. Rub abrader in 40 strokes over the upper left chest as  indicated in your monitor instructions.  Clean area with 4 enclosed alcohol pads. Let dry.  Apply patch as indicated in monitor instructions. Patch will be placed under collarbone on left  side of chest with arrow pointing upward.  Rub patch adhesive wings for 2 minutes. Remove white label marked "1". Remove the white  label marked "2". Rub patch adhesive wings for 2 additional minutes.  While looking in a mirror, press and release button in center of patch. A small green light will  flash 3-4 times. This will be your only indicator that the monitor has been turned on.  Do not shower for the first 24 hours. You may shower after the first 24 hours.  Press the button if you feel a symptom. You will hear a small click. Record Date, Time and  Symptom in the Patient Logbook.  When you are ready to remove the patch, follow instructions on the  last 2 pages of Patient  Logbook. Stick patch monitor onto the last page of Patient Logbook.  Place Patient Logbook in the blue and white box. Use locking tab on box and tape box closed  securely. The blue and white box has prepaid postage on it. Please place it in the mailbox as  soon as possible. Your physician should have your test results approximately 7 days after the  monitor has been mailed back to [facility].  Call [facility] Customer  Care at [phone] if you have questions regarding  your ZIO XT patch monitor. Call them immediately if you see an orange light blinking on your  monitor.  If your monitor falls off in less than 4 days, contact our Monitor department at [phone].  If your monitor becomes loose or falls off after 4 days call [employer] at [phone] for  suggestions on securing your monitor  Follow-Up: At [facility], you and your health needs are our priority.  As part of our continuing mission to provide you with exceptional heart care, we have created designated Provider Care Teams.  These Care Teams include your primary Cardiologist (physician) and Advanced Practice Providers (APPs -  Physician Assistants and Nurse Practitioners) who all work together to provide you with the care you need, when you need it.  We recommend signing up for the patient portal called "MyChart".  Sign up information is provided on this After Visit Summary.  MyChart is used to connect with patients for Virtual Visits (Telemedicine).  Patients are able to view lab/test results, encounter notes, upcoming appointments, etc.  Non-urgent messages can be sent to your provider as well.   To learn more about what you can do with MyChart, go to [email or web address].    Your next appointment:   12 month(s)  The format for your next appointment:   In Person  Provider:   You may see Charlton Haws, MD or one of the following Advanced Practice Providers on your designated Care Team:   Nada Boozer, NP  Other Instructions You have been referred to Electrophysiologist to discuss [place].

## 2021-07-15 DIAGNOSIS — I4891 Unspecified atrial fibrillation: Secondary | ICD-10-CM

## 2021-07-25 ENCOUNTER — Encounter: Payer: Self-pay | Admitting: Podiatry

## 2021-07-25 ENCOUNTER — Telehealth (HOSPITAL_COMMUNITY): Payer: Self-pay

## 2021-07-25 NOTE — Telephone Encounter (Signed)
Spoke with the patient, detailed instructions given. She stated that she would be here for her test. Asked to call back with any questions. S.Gwynn Chalker EMTP 

## 2021-07-27 ENCOUNTER — Telehealth: Payer: Self-pay

## 2021-07-27 ENCOUNTER — Other Ambulatory Visit: Payer: Self-pay | Admitting: Cardiovascular Disease

## 2021-07-27 NOTE — Telephone Encounter (Signed)
Detailed instructions left on the patient's answering machine. Asked to call back with any questions. S.Robyn Nohr EMTP 

## 2021-07-28 ENCOUNTER — Other Ambulatory Visit: Payer: Self-pay

## 2021-07-28 ENCOUNTER — Ambulatory Visit (HOSPITAL_COMMUNITY): Payer: Medicare Other | Attending: Cardiology

## 2021-07-28 VITALS — Ht 62.0 in | Wt 195.0 lb

## 2021-07-28 DIAGNOSIS — I739 Peripheral vascular disease, unspecified: Secondary | ICD-10-CM | POA: Diagnosis present

## 2021-07-28 DIAGNOSIS — R110 Nausea: Secondary | ICD-10-CM | POA: Insufficient documentation

## 2021-07-28 DIAGNOSIS — I2510 Atherosclerotic heart disease of native coronary artery without angina pectoris: Secondary | ICD-10-CM | POA: Insufficient documentation

## 2021-07-28 DIAGNOSIS — I4891 Unspecified atrial fibrillation: Secondary | ICD-10-CM | POA: Diagnosis present

## 2021-07-28 DIAGNOSIS — I10 Essential (primary) hypertension: Secondary | ICD-10-CM | POA: Diagnosis present

## 2021-07-28 LAB — MYOCARDIAL PERFUSION IMAGING
LV dias vol: 65 mL (ref 46–106)
LV sys vol: 24 mL
Peak HR: 93 {beats}/min
Rest HR: 62 {beats}/min
SDS: 0
SRS: 0
SSS: 0
TID: 1.09

## 2021-07-28 MED ORDER — AMINOPHYLLINE 25 MG/ML IV SOLN
75.0000 mg | Freq: Once | INTRAVENOUS | Status: AC
  Administered 2021-07-28: 75 mg via INTRAVENOUS

## 2021-07-28 MED ORDER — TECHNETIUM TC 99M TETROFOSMIN IV KIT
10.9000 | PACK | Freq: Once | INTRAVENOUS | Status: AC | PRN
  Administered 2021-07-28: 10.9 via INTRAVENOUS
  Filled 2021-07-28: qty 11

## 2021-07-28 MED ORDER — REGADENOSON 0.4 MG/5ML IV SOLN
0.4000 mg | Freq: Once | INTRAVENOUS | Status: AC
  Administered 2021-07-28: 0.4 mg via INTRAVENOUS

## 2021-07-28 MED ORDER — TECHNETIUM TC 99M TETROFOSMIN IV KIT
29.7000 | PACK | Freq: Once | INTRAVENOUS | Status: AC | PRN
  Administered 2021-07-28: 29.7 via INTRAVENOUS
  Filled 2021-07-28: qty 30

## 2021-08-02 ENCOUNTER — Telehealth: Payer: Self-pay

## 2021-08-02 ENCOUNTER — Encounter: Payer: Self-pay | Admitting: Cardiology

## 2021-08-02 MED ORDER — SIMVASTATIN 40 MG PO TABS: 40.0000 mg | ORAL_TABLET | Freq: Every day | ORAL | 3 refills | Status: AC

## 2021-08-02 NOTE — Telephone Encounter (Signed)
-----   Message from Wendall Stade, MD sent at 08/02/2021  3:42 PM EDT ----- That's fine can go back to 40 mg  ----- Message ----- From: [name], RN Sent: 08/02/2021   3:32 PM EDT To: Wendall Stade, MD  The patient has been notified of the result and verbalized understanding.  All questions (if any) were answered. [name], RN 08/02/2021 3:10 PM   While given patient results. Patient wanted to see about switching from Rosuvastatin back to Simvastatin. Patient stated she does not like how Rosuvastatin makes her feel. Will forward to Dr. Eden Emms for advisement.

## 2021-08-02 NOTE — Telephone Encounter (Signed)
Called patient with changes. Updated patient's medication list.

## 2021-08-03 ENCOUNTER — Encounter: Payer: Self-pay | Admitting: Cardiology

## 2021-08-04 ENCOUNTER — Ambulatory Visit: Payer: Medicare Other | Admitting: Podiatry

## 2021-08-04 ENCOUNTER — Other Ambulatory Visit: Payer: Self-pay

## 2021-08-04 DIAGNOSIS — L600 Ingrowing nail: Secondary | ICD-10-CM | POA: Diagnosis not present

## 2021-08-04 MED ORDER — NEOMYCIN-POLYMYXIN-HC 3.5-10000-1 OT SUSP: OTIC | 0 refills | Status: DC

## 2021-08-04 MED ORDER — CEPHALEXIN 500 MG PO CAPS: 500.0000 mg | ORAL_CAPSULE | Freq: Three times a day (TID) | ORAL | 0 refills | Status: AC

## 2021-08-04 NOTE — Patient Instructions (Signed)

## 2021-08-08 NOTE — Progress Notes (Signed)
  Subjective:  Patient ID: Margaret Hobbs, female    DOB: [birth year],  MRN: [id number]  Chief Complaint  Patient presents with   Ingrown Toenail    Left great toenail    70 y.o. female presents with the above complaint. History confirmed with patient.  Thinks the corners are ingrowing.  She is a type II diabetic that is well controlled (A1c 7.2%).  She is on Humira for psoriatic arthritis.  Objective:  Physical Exam: warm, good capillary refill, no trophic changes or ulcerative lesions, normal DP and PT pulses and normal sensory exam. The left hallux nail is ingrown with a mild paronychia of the right hallux nail has some tenderness but there is no deep ingrown  Assessment:   1. Ingrowing left great toenail   2. Ingrowing right great toenail      Plan:  Patient was evaluated and treated and all questions answered.    Ingrown Nail, left -Patient elects to proceed with minor surgery to remove ingrown toenail today. Consent reviewed and signed by patient. -Ingrown nail excised. See procedure note. -Educated on post-procedure care including soaking. Written instructions provided and reviewed. -Patient to follow up in 2 weeks for nail check. -The right hallux nail medial border was treated in a slant back fashion -Cortisporin drops sent to pharmacy  Procedure: Excision of Ingrown Toenail Location: Left 1st toe lateral nail borders. Anesthesia: Lidocaine 1% plain; 1.5 mL and Marcaine 0.5% plain; 1.5 mL, digital block. Skin Prep: Betadine. Dressing: Silvadene; telfa; dry, sterile, compression dressing. Technique: Following skin prep, the toe was exsanguinated and a tourniquet was secured at the base of the toe. The affected nail border was freed, split with a nail splitter, and excised. Chemical matrixectomy was then performed with phenol and irrigated out with alcohol. The tourniquet was then removed and sterile dressing applied. Disposition: Patient tolerated procedure well.  Patient to return in 2 weeks for follow-up.     Return in about 2 weeks (around 08/18/2021) for nail re-check.

## 2021-08-10 ENCOUNTER — Ambulatory Visit: Payer: Medicare Other | Admitting: Cardiology

## 2021-08-10 ENCOUNTER — Encounter: Payer: Self-pay | Admitting: Cardiology

## 2021-08-10 ENCOUNTER — Other Ambulatory Visit: Payer: Self-pay

## 2021-08-10 VITALS — BP 142/82 | HR 63 | Ht 62.0 in | Wt 196.6 lb

## 2021-08-10 DIAGNOSIS — I10 Essential (primary) hypertension: Secondary | ICD-10-CM

## 2021-08-10 DIAGNOSIS — I4891 Unspecified atrial fibrillation: Secondary | ICD-10-CM | POA: Diagnosis not present

## 2021-08-10 DIAGNOSIS — I739 Peripheral vascular disease, unspecified: Secondary | ICD-10-CM | POA: Diagnosis not present

## 2021-08-10 DIAGNOSIS — I2510 Atherosclerotic heart disease of native coronary artery without angina pectoris: Secondary | ICD-10-CM | POA: Diagnosis not present

## 2021-08-10 NOTE — Patient Instructions (Addendum)

## 2021-08-10 NOTE — Progress Notes (Signed)
Electrophysiology Office Note:    Date:  08/10/2021   ID:  Margaret Hobbs, DOB 1951-07-14, MRN [id number]  PCP:  Cleatis Polka., MD  [facility] Cardiologist:  Charlton Haws, MD  [facility] Electrophysiologist:  Lanier Prude, MD   Referring MD: Margaret Stade, MD   Chief Complaint: Paroxysmal atrial fibrillation  History of Present Illness:    Margaret Hobbs is a 70 y.o. female who presents for an evaluation of paroxysmal atrial fibrillation at the request of Dr. Eden Hobbs. Their medical history includes CVA, diabetes, hypertension, myocardial infarction, TIA.  The patient was first diagnosed with atrial fibrillation after an abdominal aortogram procedure by Dr. Gery Hobbs in April 2022.  She was started on amiodarone but immediately did not tolerate the medication so it was stopped.  Since the procedure and that initial episode of atrial fibrillation, the patient has not experienced recurrence of her arrhythmia.  She recently wore a heart monitor which showed less than 1% burden of A. fib.  She takes Eliquis for stroke prophylaxis.  She has had no bleeding issues on Eliquis.  Past Medical History:  Diagnosis Date   Aphasia    Arthritis    CAD (coronary artery disease)    Cerebrovascular disease    CVA (cerebral vascular accident) (HCC)    Depressed    Diabetes mellitus    DM2 (diabetes mellitus, type 2) (HCC)    HLD (hyperlipidemia)    HTN (hypertension)    Hypercholesteremia    Hyperparathyroidism    MI (myocardial infarction) (HCC)    Osteoporosis    TIA (transient ischemic attack)    UTI (urinary tract infection)     Past Surgical History:  Procedure Laterality Date   ABDOMINAL AORTOGRAM W/LOWER EXTREMITY Bilateral 03/31/2021   Procedure: ABDOMINAL AORTOGRAM W/LOWER EXTREMITY;  Surgeon: Runell Gess, MD;  Location: [facility] INVASIVE CV LAB;  Service: Cardiovascular;  Laterality: Bilateral;   ABDOMINAL HYSTERECTOMY     CATARACT EXTRACTION W/PHACO  Bilateral 2017   Dr. Alben Spittle   hysterectomy - unknown type      Current Medications: Current Meds  Medication Sig   amoxicillin (AMOXIL) 500 MG capsule Take 2,000 mg by mouth as directed. Take 4 capsules (2000 mg) by mouth 1 hour prior to dental procedures   apixaban (ELIQUIS) 5 MG TABS tablet Take 1 tablet (5 mg total) by mouth 2 (two) times daily.   Ascorbic Acid (VITAMIN C) 500 MG tablet Take 500 mg by mouth 3 (three) times a week.   cholecalciferol (VITAMIN D) 1000 UNITS tablet Take 1,000 Units by mouth in the morning.   clopidogrel (PLAVIX) 75 MG tablet TAKE 1 TABLET BY MOUTH  DAILY   Coenzyme Q10 100 MG TABS Take 100 mg by mouth in the morning.   ezetimibe (ZETIA) 10 MG tablet Take 10 mg by mouth in the morning.   folic acid (FOLVITE) 1 MG tablet TAKE 2 TABLETS (2MG ) BY  MOUTH IN THE MORNING   glipiZIDE-metformin (METAGLIP) 5-500 MG tablet Take 1 tablet by mouth 2 (two) times daily before a meal.   HUMIRA 40 MG/0.4ML PSKT INJECT 40MG  SUBCUTANEOUSLY  EVERY OTHER WEEK   hydrALAZINE (APRESOLINE) 50 MG tablet TAKE 1 TABLET BY MOUTH IN  THE MORNING AND AT BEDTIME   metoprolol succinate (TOPROL-XL) 50 MG 24 hr tablet Take 1 tablet (50 mg total) by mouth daily. Take with or immediately following a meal.   neomycin-polymyxin-hydrocortisone (CORTISPORIN) 3.5-10000-1 OTIC suspension Apply 1-2 drops daily after soaking and cover with  bandaid   nitroGLYCERIN (NITROSTAT) 0.4 MG SL tablet PLACE 1 TABLET UNDER THE TONGUE EVERY 5 (FIVE) MINUTES AS NEEDED FOR CHEST PAIN (3 DOSES MAX)   olmesartan-hydrochlorothiazide (BENICAR HCT) 40-25 MG tablet Take 1 tablet by mouth in the morning.   potassium chloride SA (KLOR-CON) 20 MEQ tablet Take 20 mEq by mouth 2 (two) times daily.   simvastatin (ZOCOR) 40 MG tablet Take 1 tablet (40 mg total) by mouth at bedtime.   VITAMIN A PO Take 3,000 Units by mouth daily.   vitamin E 400 UNIT capsule Take 400 Units by mouth in the morning.   Current  Facility-Administered Medications for the 08/10/21 encounter (Office Visit) with Lanier Prude, MD  Medication   sodium chloride flush (NS) 0.9 % injection 3 mL     Allergies:   Remicade [infliximab], Amiodarone, and Sulfa antibiotics   Social History   Socioeconomic History   Marital status: Widowed    Spouse name: Not on file   Number of children: 1   Years of education: Not on file   Highest education level: Not on file  Occupational History   Not on file  Tobacco Use   Smoking status: Never   Smokeless tobacco: Never  Vaping Use   Vaping Use: Never used  Substance and Sexual Activity   Alcohol use: No   Drug use: Never   Sexual activity: Not on file  Other Topics Concern   Not on file  Social History Narrative   Not on file   Social Determinants of Health   Financial Resource Strain: Not on file  Food Insecurity: Not on file  Transportation Needs: Not on file  Physical Activity: Not on file  Stress: Not on file  Social Connections: Not on file     Family History: The patient's family history includes Alcohol abuse in an other family member; Coronary artery disease in an other family member; Diabetes in her brother and another family member; Heart disease in her mother; Hyperlipidemia in her mother; Hypertension in her mother, son, and another family member; Sleep apnea in her son.  ROS:   Please see the history of present illness.    All other systems reviewed and are negative.  EKGs/Labs/Other Studies Reviewed:    The following studies were reviewed today: Outside records from [facility] medical  August 02, 2021 ZIO monitor personally reviewed 1% burden of atrial fibrillation with ventricular rates ranging from 72 to 158 bpm with average ventricular rate of 118 bpm.  The longest episode of atrial fibrillation lasted 41 minutes. A. fib was detected within 1 minute of symptomatic patient events.  Frequent atrial ectopy.  Occasional ventricular  ectopy. Personal review of the tracings confirms atrial fibrillation.  EKG:  The ekg ordered today demonstrates sinus rhythm  Recent Labs: 04/01/2021: Magnesium 1.6; TSH 1.254 07/06/2021: ALT 34; BUN 22; Creat 0.98; Hemoglobin 11.9; Platelets 302; Potassium 4.1; Sodium 141  Recent Lipid Panel    Component Value Date/Time   CHOL 138 08/24/2016 0912   TRIG 197 (H) 08/24/2016 0912   HDL 40 (L) 08/24/2016 0912   CHOLHDL 3.5 08/24/2016 0912   VLDL 39 (H) 08/24/2016 0912   LDLCALC 59 08/24/2016 0912    Physical Exam:    VS:  BP (!) 142/82   Pulse 63   Ht 5\' 2"  (1.575 m)   Wt 196 lb 9.6 oz (89.2 kg)   SpO2 98%   BMI 35.96 kg/m     Wt Readings from Last 3 Encounters:  08/10/21 196 lb 9.6 oz (89.2 kg)  07/28/21 195 lb (88.5 kg)  07/11/21 195 lb 12.8 oz (88.8 kg)     GEN: Well nourished, well developed in no acute distress.  Obese HEENT: Normal NECK: No JVD; No carotid bruits LYMPHATICS: No lymphadenopathy CARDIAC: RRR, no murmurs, rubs, gallops RESPIRATORY:  Clear to auscultation without rales, wheezing or rhonchi  ABDOMEN: Soft, non-tender, non-distended MUSCULOSKELETAL:  No edema; No deformity  SKIN: Warm and dry NEUROLOGIC:  Alert and oriented x 3 PSYCHIATRIC:  Normal affect   ASSESSMENT:    1. Atrial fibrillation with RVR (HCC)   2. Coronary artery disease involving native coronary artery of native heart without angina pectoris   3. Peripheral arterial disease (HCC)   4. Essential hypertension    PLAN:    In order of problems listed above:   1. Atrial fibrillation with RVR (HCC) Very low burden.  Recent monitor showed less than 1% burden.  Did not tolerate amiodarone in April 2022.  I would not restart this medication.  If in the future, she experiences a higher burden of atrial fibrillation or highly symptomatic salvos of A. fib, would consider starting alternative antiarrhythmic drug.  We discussed Tikosyn during today's visit.  I would not start this yet but  continue to monitor for an increasing burden.   In the meantime, she should continue to take Eliquis for stroke prophylaxis.  Continue metoprolol succinate 50 mg by mouth daily.  2. Coronary artery disease involving native coronary artery of native heart without angina pectoris No ischemic symptoms today.  Continue follow-up with Dr. Eden Hobbs.  3. Peripheral arterial disease (HCC) Follow-up with Dr. Eden Hobbs and Dr. Gery Hobbs.  4. Essential hypertension Controlled during today's visit.  Continue current medical regimen.    Follow-up with EP as needed.    Medication Adjustments/Labs and Tests Ordered: Current medicines are reviewed at length with the patient today.  Concerns regarding medicines are outlined above.  No orders of the defined types were placed in this encounter.  No orders of the defined types were placed in this encounter.    Signed, Rossie Muskrat. Lalla Brothers, MD, [facility], [facility] 08/10/2021 3:56 PM    Electrophysiology [facility]

## 2021-08-22 ENCOUNTER — Other Ambulatory Visit: Payer: Self-pay

## 2021-08-22 ENCOUNTER — Ambulatory Visit: Payer: Medicare Other | Admitting: Podiatry

## 2021-08-22 DIAGNOSIS — L600 Ingrowing nail: Secondary | ICD-10-CM | POA: Diagnosis not present

## 2021-08-24 ENCOUNTER — Encounter: Payer: Self-pay | Admitting: Podiatry

## 2021-08-24 NOTE — Progress Notes (Signed)
  Subjective:  Patient ID: Margaret Hobbs, female    DOB: 02-22-1951,  MRN: [id number]  Chief Complaint  Patient presents with   Ingrown Toenail       nail re-check    70 y.o. female presents with the above complaint. History confirmed with patient.  Overall doing well she been doing the soaks and ointment  Objective:  Physical Exam: warm, good capillary refill, no trophic changes or ulcerative lesions, normal DP and PT pulses and normal sensory exam. The left hallux nail matricectomy site is healing well the right hallux nail has some tenderness but there is no deep ingrown  Assessment:   1. Ingrowing left great toenail      Plan:  Patient was evaluated and treated and all questions answered.    Ingrown Nail, left -Overall doing well can leave open to air I did debride the right hallux nail slightly    Return if symptoms worsen or fail to improve.

## 2021-08-25 ENCOUNTER — Other Ambulatory Visit: Payer: Self-pay | Admitting: Rheumatology

## 2021-08-25 NOTE — Telephone Encounter (Signed)
Next Visit: 10/13/2021  Last Visit: 05/05/2021  Last Fill: 09/21/2020  Dx: Psoriatic arthritis   Current Dose per office note on 05/05/2021: not mentioned  Okay to refill folic acid?

## 2021-09-08 ENCOUNTER — Encounter (INDEPENDENT_AMBULATORY_CARE_PROVIDER_SITE_OTHER): Payer: Self-pay

## 2021-09-16 ENCOUNTER — Telehealth: Payer: Self-pay | Admitting: *Deleted

## 2021-09-16 NOTE — Telephone Encounter (Signed)
Labs received from:[facility]  Drawn on:09/13/2021  Reviewed by:Dr. Pollyann Savoy   Labs drawn:CBC,CMP, Vitamin D  Results:BUN 30  EOS 0.5  RDW 11.3  MCHC 30.6  Vitamin D 29.2  Patient on  Humira 40 mg/0.4 mL every 14 days.  Recommendation per Dr. Corliss Skains, Vitamin D 2000 units daily.   Attempted to contact the patient and left message for patient to call the office.

## 2021-09-17 ENCOUNTER — Emergency Department (HOSPITAL_COMMUNITY)
Admission: EM | Admit: 2021-09-17 | Discharge: 2021-09-17 | Disposition: A | Discharge status: Home / Self Care | Payer: Medicare Other | Attending: Emergency Medicine | Admitting: Emergency Medicine

## 2021-09-17 ENCOUNTER — Other Ambulatory Visit: Payer: Self-pay

## 2021-09-17 DIAGNOSIS — E785 Hyperlipidemia, unspecified: Secondary | ICD-10-CM | POA: Insufficient documentation

## 2021-09-17 DIAGNOSIS — E1169 Type 2 diabetes mellitus with other specified complication: Secondary | ICD-10-CM | POA: Diagnosis not present

## 2021-09-17 DIAGNOSIS — I2510 Atherosclerotic heart disease of native coronary artery without angina pectoris: Secondary | ICD-10-CM | POA: Insufficient documentation

## 2021-09-17 DIAGNOSIS — I10 Essential (primary) hypertension: Secondary | ICD-10-CM | POA: Diagnosis not present

## 2021-09-17 DIAGNOSIS — R040 Epistaxis: Secondary | ICD-10-CM | POA: Diagnosis not present

## 2021-09-17 LAB — CBC WITH DIFFERENTIAL/PLATELET
Abs Immature Granulocytes: 0.01 10*3/uL (ref 0.00–0.07)
Basophils Absolute: 0.1 10*3/uL (ref 0.0–0.1)
Basophils Relative: 1 %
Eosinophils Absolute: 0.4 10*3/uL (ref 0.0–0.5)
Eosinophils Relative: 5 %
HCT: 39.9 % (ref 36.0–46.0)
Hemoglobin: 13 g/dL (ref 12.0–15.0)
Immature Granulocytes: 0 %
Lymphocytes Relative: 15 %
Lymphs Abs: 1.2 10*3/uL (ref 0.7–4.0)
MCH: 30.3 pg (ref 26.0–34.0)
MCHC: 32.6 g/dL (ref 30.0–36.0)
MCV: 93 fL (ref 80.0–100.0)
Monocytes Absolute: 0.5 10*3/uL (ref 0.1–1.0)
Monocytes Relative: 6 %
Neutro Abs: 5.9 10*3/uL (ref 1.7–7.7)
Neutrophils Relative %: 73 %
Platelets: 260 10*3/uL (ref 150–400)
RBC: 4.29 MIL/uL (ref 3.87–5.11)
RDW: 12.3 % (ref 11.5–15.5)
WBC: 8.1 10*3/uL (ref 4.0–10.5)
nRBC: 0 % (ref 0.0–0.2)

## 2021-09-17 LAB — COMPREHENSIVE METABOLIC PANEL
ALT: 26 U/L (ref 0–44)
AST: 24 U/L (ref 15–41)
Albumin: 3.4 g/dL — ABNORMAL LOW (ref 3.5–5.0)
Alkaline Phosphatase: 37 U/L — ABNORMAL LOW (ref 38–126)
Anion gap: 8 (ref 5–15)
BUN: 26 mg/dL — ABNORMAL HIGH (ref 8–23)
CO2: 28 mmol/L (ref 22–32)
Calcium: 9.3 mg/dL (ref 8.9–10.3)
Chloride: 103 mmol/L (ref 98–111)
Creatinine, Ser: 0.99 mg/dL (ref 0.44–1.00)
GFR, Estimated: >60 mL/min (ref >60)
Glucose, Bld: 157 mg/dL — ABNORMAL HIGH (ref 70–99)
Potassium: 4.4 mmol/L (ref 3.5–5.1)
Sodium: 139 mmol/L (ref 135–145)
Total Bilirubin: 0.6 mg/dL (ref 0.3–1.2)
Total Protein: 6.7 g/dL (ref 6.5–8.1)

## 2021-09-17 MED ORDER — SILVER NITRATE-POT NITRATE 75-25 % EX MISC
1.0000 | Freq: Once | CUTANEOUS | Status: AC
  Administered 2021-09-17: 1 via TOPICAL
  Filled 2021-09-17: qty 1

## 2021-09-17 MED ORDER — LABETALOL HCL 5 MG/ML IV SOLN
5.0000 mg | Freq: Once | INTRAVENOUS | Status: AC
  Administered 2021-09-17: 5 mg via INTRAVENOUS
  Filled 2021-09-17: qty 4

## 2021-09-17 MED ORDER — LIDOCAINE-EPINEPHRINE (PF) 2 %-1:200000 IJ SOLN
10.0000 mL | Freq: Once | INTRAMUSCULAR | Status: AC
  Administered 2021-09-17: 10 mL
  Filled 2021-09-17: qty 20

## 2021-09-17 NOTE — ED Provider Notes (Signed)
Emergency Department Provider Note   I have reviewed the triage vital signs and the nursing notes.   HISTORY  Chief Complaint Hypertension (Pt presents with a nose bleed and high blood pressure that started this am, alert and orientedx4 said that her meds were adjusted after a procedure to remove a blood clot in her leg this week)   HPI Margaret Hobbs is a 70 y.o. female with past medical history reviewed below, on anticoagulation, presents emergency department elevated blood pressures and epistaxis.  Patient states that recently her blood pressure medications have been adjusted, most of them have been adjusted downward.  She is noticed high blood pressures with systolic pressures in the 190s.  She is developed intermittent epistaxis from the right nostril starting today.  She notes that the bleeding ultimately stops but then returns later on.  Denies any nose trauma.  She is compliant with her home medications but notes that her blood pressures continued to be high.  She not experiencing headache, numbness/weakness, chest pain, shortness of breath.  No vision changes. No speech disturbance.   Past Medical History:  Diagnosis Date   Aphasia    Arthritis    CAD (coronary artery disease)    Cerebrovascular disease    CVA (cerebral vascular accident) (HCC)    Depressed    Diabetes mellitus    DM2 (diabetes mellitus, type 2) (HCC)    HLD (hyperlipidemia)    HTN (hypertension)    Hypercholesteremia    Hyperparathyroidism    MI (myocardial infarction) (HCC)    Osteoporosis    TIA (transient ischemic attack)    UTI (urinary tract infection)     Patient Active Problem List   Diagnosis Date Noted   Pseudophakia of both eyes 04/28/2021   Atrial fibrillation with RVR (HCC) 04/01/2021   Claudication in peripheral vascular disease (HCC) 03/31/2021   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) 05/06/2020   Round hole of right eye 05/06/2020   Severe  nonproliferative diabetic retinopathy of left eye (HCC) 05/06/2020   Retinal hemorrhage of left eye 05/06/2020   Peripheral arterial disease (HCC) 10/17/2019   Carotid artery disease (HCC) 01/24/2017   Primary osteoarthritis of both knees 11/29/2016   Other psoriasis 11/28/2016   Psoriatic arthritis (HCC) 11/28/2016   High risk medication use 11/28/2016   Type 2 diabetes mellitus (HCC) 03/30/2009   Hyperlipidemia 03/30/2009   Dyslipidemia 03/30/2009   Essential hypertension 03/30/2009   MYOCARDIAL INFARCTION 03/30/2009   Coronary artery disease involving native coronary artery of native heart without angina pectoris 03/30/2009   Cerebral artery occlusion with cerebral infarction (HCC) 03/30/2009   URINARY TRACT INFECTION 03/30/2009   ARTHRITIS 03/30/2009   HYPERPARATHYROIDISM, HX OF 03/30/2009    Past Surgical History:  Procedure Laterality Date   ABDOMINAL AORTOGRAM W/LOWER EXTREMITY Bilateral 03/31/2021   Procedure: ABDOMINAL AORTOGRAM W/LOWER EXTREMITY;  Surgeon: Runell Gess, MD;  Location: [facility];  Service: Cardiovascular;  Laterality: Bilateral;   ABDOMINAL HYSTERECTOMY     CATARACT EXTRACTION W/PHACO Bilateral 2017   Dr. Alben Spittle   hysterectomy - unknown type      Allergies Remicade [infliximab], Amiodarone, and Sulfa antibiotics  Family History  Problem Relation Age of Onset   Heart disease Mother        pacemaker   Hypertension Mother    Hyperlipidemia Mother    Diabetes Brother        pre-diabetic    Hypertension Son    Sleep apnea Son  Diabetes Other    Hypertension Other    Alcohol abuse Other    Coronary artery disease Other     Social History Social History   Tobacco Use   Smoking status: Never   Smokeless tobacco: Never  Vaping Use   Vaping Use: Never used  Substance Use Topics   Alcohol use: No   Drug use: Never    Review of Systems  Constitutional: No fever/chills Eyes: No visual changes. ENT: No sore throat. Positive  right nostril epistaxis.  Cardiovascular: Denies chest pain. Positive elevated BP.  Respiratory: Denies shortness of breath. Gastrointestinal: No abdominal pain.  No nausea, no vomiting.  No diarrhea.  No constipation. Genitourinary: Negative for dysuria. Musculoskeletal: Negative for back pain. Skin: Negative for rash. Neurological: Negative for headaches, focal weakness or numbness.  10-point ROS otherwise negative.  ____________________________________________   PHYSICAL EXAM:  VITAL SIGNS: ED Triage Vitals  Enc Vitals Group     BP 09/17/21 0202 (!) 197/86     Pulse Rate 09/17/21 0202 68     Resp 09/17/21 0202 18     Temp 09/17/21 0202 98.5 F (36.9 C)     Temp Source 09/17/21 0202 Oral     SpO2 09/17/21 0158 97 %     Weight 09/17/21 0204 196 lb (88.9 kg)     Height 09/17/21 0204 5\' 2"  (1.575 m)   Constitutional: Alert and oriented. Well appearing and in no acute distress. Eyes: Conjunctivae are normal.  Head: Atraumatic. {Nose: No congestion/rhinnorhea.  Punctate area along the anterior nasal septum on the right which appears to have been recently bleeding. No active bleeding.  Mouth/Throat: Mucous membranes are moist.  Neck: No stridor. Cardiovascular: Normal rate, regular rhythm. Good peripheral circulation. Grossly normal heart sounds.   Respiratory: Normal respiratory effort.  No retractions. Lungs CTAB. Gastrointestinal: Soft and nontender. No distention.  Musculoskeletal: No lower extremity tenderness nor edema. No gross deformities of extremities. Neurologic:  Normal speech and language. No gross focal neurologic deficits are appreciated.  Skin:  Skin is warm, dry and intact. No rash noted.  ____________________________________________   LABS (all labs ordered are listed, but only abnormal results are displayed)  Labs Reviewed  COMPREHENSIVE METABOLIC PANEL - Abnormal; Notable for the following components:      Result Value   Glucose, Bld 157 (*)    BUN  26 (*)    Albumin 3.4 (*)    Alkaline Phosphatase 37 (*)    All other components within normal limits  CBC WITH DIFFERENTIAL/PLATELET   ____________________________________________  RADIOLOGY  No results found.  ____________________________________________   PROCEDURES  Procedure(s) performed:   .Epistaxis Management  Date/Time: 09/17/2021 5:27 AM Performed by: 09/19/2021, MD Authorized by: Maia Plan, MD   Consent:    Consent obtained:  Verbal   Consent given by:  Patient   Risks, benefits, and alternatives were discussed: yes     Risks discussed:  Bleeding, infection, nasal injury and pain   Alternatives discussed:  No treatment Universal protocol:    Patient identity confirmed:  Verbally with patient Anesthesia:    Anesthesia method:  Topical application   Topical anesthetic:  Epinephrine and lidocaine gel Procedure details:    Treatment site:  R septum   Treatment method:  Silver nitrate   Treatment complexity:  Limited   Treatment episode: initial   Post-procedure details:    Assessment:  No improvement   Procedure completion:  Tolerated well, no immediate complications   ____________________________________________  INITIAL IMPRESSION / ASSESSMENT AND PLAN / ED COURSE  Pertinent labs & imaging results that were available during my care of the patient were reviewed by me and considered in my medical decision making (see chart for details).   Patient presents emergency department with elevated blood pressures and epistaxis.  No findings on exam or history to strongly suspect hypertensive emergency.  Do plan for screening blood work.  She has undergone several medication adjustments, per patient, regarding her blood pressure medication I suspect this may be leading to her symptoms.  Unclear if epistaxis is related to her elevated blood pressures versus the changing of seasons, which seems more likely.  She does have an area of the right, anterior nasal  septum which appears to have been recently bleeding.  Discussed anesthetizing with topical anesthetic and silver nitrate.  Discussed risk/benefits.  Patient consents verbally to treatment.   05:25 AM  Labs reviewed with no findings to suspect HTN emergency. Patient tolerated silver nitrate well. Plan for Labetalol for BP and likely d/c to continue outpatient HTN mgmt with her PCP and Cardiology teams.   06:25 AM  No re-bleeding after epistaxis mgmt. Stable for discharge.  ____________________________________________  FINAL CLINICAL IMPRESSION(S) / ED DIAGNOSES  Final diagnoses:  Primary hypertension  Epistaxis     MEDICATIONS GIVEN DURING THIS VISIT:  Medications  labetalol (NORMODYNE) injection 5 mg (5 mg Intravenous Given 09/17/21 0549)  lidocaine-EPINEPHrine (XYLOCAINE W/EPI) 2 %-1:200000 (PF) injection 10 mL (10 mLs Infiltration Given 09/17/21 0546)  silver nitrate applicators applicator 1 Stick (1 Stick Topical Given 09/17/21 0546)    Note:  This document was prepared using Dragon voice recognition software and may include unintentional dictation errors.  Alona Bene, MD, [facility]    Cianni Manny, Arlyss Repress, MD 09/17/21 [phone]

## 2021-09-17 NOTE — ED Triage Notes (Signed)
Pt has blood trickling from rt nostril, pressure applied, is answering all questions appropriately

## 2021-09-17 NOTE — Discharge Instructions (Signed)

## 2021-09-23 ENCOUNTER — Ambulatory Visit: Payer: Medicare Other | Admitting: Physician Assistant

## 2021-09-23 NOTE — Progress Notes (Signed)
Cardiology Office Note:    Date:  09/26/2021   ID:  Margaret Hobbs, DOB 1950/12/30, MRN [id number]  PCP:  Cleatis Polka., MD  Cardiologist:  Charlton Haws, MD  Electrophysiologist:  Lanier Prude, MD   Referring MD: Cleatis Polka., MD   Chief Complaint: ED follow-up for hypertension  History of Present Illness:    Margaret Hobbs is a 70 y.o. female with a history of CAD with remote inferior MI in 2008 s/p BMS to RCA, paroxysmal atrial fibrillation on Eliquis (unable to tolerate Amiodarone), PAD with recent peripheral angiogram in 03/2021 showing calcified CTO of distal left SFA and 70% stenosis of right SFA (intervention aborted due to new onset atrial fibrillation), mild bilateral carotid artery stenosis, prior CVA, hypertension, hyperlipidemia, and diabetes mellitus who is followed by Dr. Eden Emms and presents today for follow-up from recent ED visit for hypertension.  Patient has known CAD and PAD as described above. Lower extremity dopplers in 10/2019 showed left ABI of 0.57 with an occluded left SFA. Peripheral angiography/intervention had been discussed multiple times but patient declined. Patient was seen by Dr. Allyson Sabal in 02/2021 for PAD at which time she continued to have claudication and was ready to proceed with peripheral angiogram. She presented for this procedure on 03/31/2021. Peripheral angiogram showed calcified CTO of distal left SFA and above-the-knee popliteal artery with one-vessel runoff via the posterior tibial artery and 70% stenosis of proximal right SFA with one vessel runoff via the posterior tibial artery. Intervention was aborted when patient went into new onset atrial fibrillation with RVR. She was started on Amiodarone and Eliquis and converted back to sinus rhythm. Outpatient Echo following this admission showed LVEF of 60-65% with normal wall motion and grade 1 diastolic dysfunction. No significant valvular disease. At follow-up visit with Dr. Allyson Sabal,  she did express her wish to proceed with endovascular therapy of her CTO given significant claudication. Procedure was initially scheduled for 04/2021 but was cancelled due to worsening renal function as well as the national contrast shortage. It does not look like this was  ever rescheduled. Amiodarone ultimately had to be stopped due to dizziness.She was seen by Dr. Eden Emms for follow-up in 06/2021 at which time she was maintaining sinus rhythm and was still having claudication but otherwise feeling well from a cardiac standpoint. Myoview was ordered for follow-up of CAD and Zio monitor was ordered to assess for atrial fibrillation burden.She was also referred to EP to discuss possible Tikosyn. Myoview was low risk with no evidence of ischemia or prior infarct. Monitor showed normal sinus rhythm with <1% atrial fibrillation burden but frequent PACs (8.9%). Patient was seen by Dr. Lalla Brothers in 07/2021 and decision was made to hold off on Tikosyn for now given low atrial fibrillation burden.  Since this last visit, she was seen in the ED on 09/17/2021 for elevated BP and epistaxis. Her BP medications had reportedly just been decreased and her systolic BP was in the 190s. Margaret Hobbs then developed intermittent epistaxis from the right nostril. She denied any headache, chest pain, or shortness of breath. Labs unremarkable. Patient was treated with topical anesthetic and silver nitrate for epistaxis and was Labetalol for her BP and then discharged with instructions to follow-up with PCP and Cardiology.  Patient presents today for follow-up.  Here alone.  Patient denies any recurrent epistasis since her visit in the ED.  She was recently seen by her PCP (Dr. Clelia Croft) last week and they went through all of her antihypertensives.  We reconciled this with what we have in our system today.  Her BP has been much better with systolic BP running in the 130s to 140s.  She denies any chest pain or shortness of breath.  No orthopnea, PND,  lower extremity edema.  She notes heart racing after she takes her morning medications but it does not last for long.  No lightheadedness, dizziness, syncope.  No abnormal bleeding in urine or stools on Eliquis and Plavix.  She is still having claudication in her left calf but this is overall stable.  However, she states she is not currently as active and is not doing much to exacerbate this.  She is unsure if she wants to continue to follow with Dr. Allyson Sabal for this.  Past Medical History:  Diagnosis Date   Aphasia    Arthritis    CAD (coronary artery disease)    Cerebrovascular disease    CVA (cerebral vascular accident) (HCC)    Depressed    Diabetes mellitus    DM2 (diabetes mellitus, type 2) (HCC)    HLD (hyperlipidemia)    HTN (hypertension)    Hypercholesteremia    Hyperparathyroidism    MI (myocardial infarction) (HCC)    Osteoporosis    TIA (transient ischemic attack)    UTI (urinary tract infection)     Past Surgical History:  Procedure Laterality Date   ABDOMINAL AORTOGRAM W/LOWER EXTREMITY Bilateral 03/31/2021   Procedure: ABDOMINAL AORTOGRAM W/LOWER EXTREMITY;  Surgeon: Runell Gess, MD;  Location: [facility];  Service: Cardiovascular;  Laterality: Bilateral;   ABDOMINAL HYSTERECTOMY     CATARACT EXTRACTION W/PHACO Bilateral 2017   Dr. Alben Spittle   hysterectomy - unknown type      Current Medications: Current Meds  Medication Sig   apixaban (ELIQUIS) 5 MG TABS tablet Take 1 tablet (5 mg total) by mouth 2 (two) times daily.   Ascorbic Acid (VITAMIN C) 500 MG tablet Take 500 mg by mouth 3 (three) times a week.   cholecalciferol (VITAMIN D) 1000 UNITS tablet Take 1,000 Units by mouth in the morning.   Coenzyme Q10 100 MG TABS Take 100 mg by mouth in the morning.   ezetimibe (ZETIA) 10 MG tablet Take 10 mg by mouth in the morning.   folic acid (FOLVITE) 1 MG tablet TAKE 2 TABLETS (2MG ) BY  MOUTH IN THE MORNING   glipiZIDE-metformin (METAGLIP) 5-500 MG tablet  Take 1 tablet by mouth 2 (two) times daily before a meal.   HUMIRA 40 MG/0.4ML PSKT INJECT 40MG  SUBCUTANEOUSLY  EVERY OTHER WEEK   hydrALAZINE (APRESOLINE) 50 MG tablet TAKE 1 TABLET BY MOUTH IN  THE MORNING AND AT BEDTIME   hydrALAZINE (APRESOLINE) 50 MG tablet Take 50 mg by mouth once. 1 Tablet at Bedtime   metoprolol succinate (TOPROL-XL) 50 MG 24 hr tablet Take 1 tablet (50 mg total) by mouth daily. Take with or immediately following a meal.   nitroGLYCERIN (NITROSTAT) 0.4 MG SL tablet PLACE 1 TABLET UNDER THE TONGUE EVERY 5 (FIVE) MINUTES AS NEEDED FOR CHEST PAIN (3 DOSES MAX)   olmesartan-hydrochlorothiazide (BENICAR HCT) 40-25 MG tablet Take 1 tablet by mouth in the morning.   potassium chloride SA (KLOR-CON) 20 MEQ tablet Take 20 mEq by mouth 2 (two) times daily.   simvastatin (ZOCOR) 40 MG tablet Take 1 tablet (40 mg total) by mouth at bedtime.   VITAMIN A PO Take 3,000 Units by mouth daily.   vitamin E 400 UNIT capsule Take 400 Units by  mouth in the morning.   Current Facility-Administered Medications for the 09/26/21 encounter (Office Visit) with Corrin Parker, PA-C  Medication   sodium chloride flush (NS) 0.9 % injection 3 mL     Allergies:   Remicade [infliximab], Amiodarone, and Sulfa antibiotics   Social History   Socioeconomic History   Marital status: Widowed    Spouse name: Not on file   Number of children: 1   Years of education: Not on file   Highest education level: Not on file  Occupational History   Not on file  Tobacco Use   Smoking status: Never   Smokeless tobacco: Never  Vaping Use   Vaping Use: Never used  Substance and Sexual Activity   Alcohol use: No   Drug use: Never   Sexual activity: Not on file  Other Topics Concern   Not on file  Social History Narrative   Not on file   Social Determinants of Health   Financial Resource Strain: Not on file  Food Insecurity: Not on file  Transportation Needs: Not on file  Physical Activity: Not on  file  Stress: Not on file  Social Connections: Not on file     Family History: The patient's family history includes Alcohol abuse in an other family member; Coronary artery disease in an other family member; Diabetes in her brother and another family member; Heart disease in her mother; Hyperlipidemia in her mother; Hypertension in her mother, son, and another family member; Sleep apnea in her son.  ROS:   Please see the history of present illness.     EKGs/Labs/Other Studies Reviewed:    The following studies were reviewed today:  Peripheral Angiogram 03/31/2021: Angiographic Data:  1: Abdominal aorta-renal arteries widely patent.  The infrarenal abdominal aorta was free of significant atherosclerotic changes. 2: Left lower extremity-short segment calcified CTO distal left SFA and above-the-knee popliteal artery with one-vessel runoff via the posterior tibial artery. 3: Right lower extremity-70% fairly focal proximal right SFA stenosis with one-vessel runoff via the posterior tibial artery.   Impression: [name] has a calcified distal left SFA CTO responsible for claudication.  She did go in A. fib with RVR at the beginning of the procedure.  Because this is a new rhythm for her I elected to abort the intervention.  She received 150 mg of amiodarone bolus followed by infusion.  She will be admitted to the hospital for rhythm management.  The sheath will be removed once ACT falls below 170 and pressure will be held.  She will be gently hydrated overnight.  Once her rhythm issues have been resolved we will bring her back in a staged fashion to address her distal left SFA CTO.  She left the lab in stable condition. _______________  Echocardiogram 04/14/2021: Impressions: 1. Left ventricular ejection fraction, by estimation, is 60 to 65%. The  left ventricle has normal function. The left ventricle has no regional  wall motion abnormalities. Left ventricular diastolic parameters are   consistent with Grade I diastolic  dysfunction (impaired relaxation).   2. Right ventricular systolic function is normal. The right ventricular  size is normal.   3. The mitral valve is normal in structure. Trivial mitral valve  regurgitation.   4. The aortic valve is tricuspid. There is mild calcification of the  aortic valve. There is mild thickening of the aortic valve. Aortic valve  regurgitation is not visualized. Mild aortic valve sclerosis is present,  with no evidence of aortic valve  stenosis.  5. The inferior vena cava is normal in size with greater than 50%  respiratory variability, suggesting right atrial pressure of 3 mmHg.   Comparison(s): No prior Echocardiogram. _______________  Myoview 07/28/2021: The left ventricular ejection fraction is normal (55-65%). Nuclear stress EF: 62%. There was no ST segment deviation noted during stress. Defect 1: There is a small defect of moderate severity present in the basal inferior, basal inferolateral, mid inferior and apical inferior location. This is a low risk study.   There is a small defect in the inferior/inferolateral wall that is present at rest and similar to slightly improved with stress, not consistent with ischemia. Normal wall motion in this area. Low risk study. _______________  Judeth Cornfield 07/15/2021 to 04/28/2021: Patient had a min HR of 48 bpm, max HR of 169 bpm, and avg HR of 65 bpm. Predominant underlying rhythm was Sinus Rhythm. 15 Supraventricular Tachycardia runs occurred, the run with the fastest interval lasting 6 beats with a max rate of 169 bpm, the  longest lasting 9 beats with an avg rate of 129 bpm. Atrial Fibrillation occurred (1% burden), ranging from 72-158 bpm (avg of 118 bpm), the longest lasting 41 mins 13 secs with an avg rate of 121 bpm. Idioventricular Rhythm was present. Atrial Fibrillation was detected within +/- 45 seconds of symptomatic patient event(s). Isolated SVEs were frequent (8.9%,  [id number]), SVE Couplets were rare (<1.0%, 1851), and SVE Triplets were rare (<1.0%, 211). Isolated VEs were occasional (1.9%, 24080), VE Couplets were rare (<1.0%, 213), and VE Triplets were rare (<1.0%, 29). Ventricular Bigeminy and Trigeminy were present.    Summary:  NSR average HR 65 bpm PAF < 1% burden Frequent atrial ectopy 8.9%   EKG:  EKG ordered today. EKG personally reviewed and demonstrates normal sinus rhythm, rate 65 bpm, with inferior Q waves. A lot of baseline artifact but no acute ST/T changes. Left axis deviation. Normal PR and QRS. QTc 436 ms.  Recent Labs: 04/01/2021: Magnesium 1.6; TSH 1.254 09/17/2021: ALT 26; BUN 26; Creatinine, Ser 0.99; Hemoglobin 13.0; Platelets 260; Potassium 4.4; Sodium 139  Recent Lipid Panel    Component Value Date/Time   CHOL 138 08/24/2016 0912   TRIG 197 (H) 08/24/2016 0912   HDL 40 (L) 08/24/2016 0912   CHOLHDL 3.5 08/24/2016 0912   VLDL 39 (H) 08/24/2016 0912   LDLCALC 59 08/24/2016 0912    Physical Exam:    Vital Signs: BP 138/65   Pulse 65   Ht 5\' 2"  (1.575 m)   Wt 198 lb (89.8 kg)   SpO2 98%   BMI 36.21 kg/m     Wt Readings from Last 3 Encounters:  09/26/21 198 lb (89.8 kg)  09/17/21 196 lb (88.9 kg)  08/10/21 196 lb 9.6 oz (89.2 kg)     General: 70 y.o. Caucasian female in no acute distress. HEENT: Normocephalic and atraumatic. Sclera clear. EOMs intact. Neck: Supple. Soft carotid bruits noted bilaterally. No JVD. Heart: RRR. Distinct S1 and S2. No murmurs, gallops, or rubs. Radial pulses 2+ and equal bilaterally. Lungs: No increased work of breathing. Clear to ausculation bilaterally. No wheezes, rhonchi, or rales.  Abdomen: Soft, non-distended, and non-tender to palpation.  Extremities: No lower extremity edema.    Skin: Warm and dry. Neuro: Alert and oriented x3. No focal deficits. Psych: Normal affect. Responds appropriately.  Assessment:    1. Coronary artery disease involving native coronary artery of native  heart without angina pectoris   2. Paroxysmal atrial fibrillation (HCC)  3. PAD (peripheral artery disease) (HCC)   4. Bilateral carotid artery stenosis   5. Primary hypertension   6. Hyperlipidemia, unspecified hyperlipidemia type   7. Type 2 diabetes mellitus with complication, without long-term current use of insulin (HCC)     Plan:    CAD - History of remote MI in 2008 s/p BMS to RCA. Recent Myoview in 07/2021 was low risk with no evidence of ischemia or infarct.  - No angina. - Patient has not been taking Plavix for the last few days due to that not being on medications list in PCP's office. Recommended restarting this. - Continue beta-blocker and statin.   Paroxysmal Atrial Fibrillation  - First diagnosed in 03/2021 during peripheral angiogram. Converted back to sinus rhythm with Amiodarone. Recent monitor showed <1% atrial fibrillation burden. - Maintaining sinus rhythm. - Continue Toprol-XL 50mg  daily. - Unable to tolerate Amiodarone due to dizziness.  - She was seen by EP in 07/2021 - Tikosyn an option if she has increase in burden but not plans to start now. - Continue chronic anticoagulation with Eliquis 5mg  twice daily.   PAD - Peripheral angiogram in 03/2021 showed calcified CTO of distal left SFA and 70% stenosis of right SFA. Intervention aborted due to new onset atrial fibrillation. At last visit with Dr. , decision was made to proceed with intervention of CTO lesion but this was delayed due to patient renal function and the national contrast shortage earlier this year. - Continues to have claudication but this is stable. - Patient is not sure she wants to continue to follow with Dr. 04/2021 for this. Offered to refer her to Dr. Allyson Sabal or Vascular Surgery but she is not sure what she wants to do at this time. She will think about it and let Allyson Sabal know.  Carotid Stenosis - Carotid ultrasound in 2020 showed mild stenosis (1-39%) of bilateral ICAs.  - Soft bilateral bruits  noted on exam.  - Will repeat carotid dopplers.  Hypertension - BP slightly above goal at 135/65. She states systolic BP has been in the 130s to 140s at home. - Current medications: Olmesartan-HCTZ 40-25mg  daily, Toprol-XL 50mg  daily, and Hydralazine 50mg  once daily.  - Initially recommended increasing Hydralazine to twice daily; however, patient states she was having palpitations when she took this in the morning. She would like to hold off on making any changes today given all the changes recently. Asked patient to keep BP/HR log for 2 weeks and then send this to [place].   Hyperlipidemia - Labs followed by PCP. Last LDL in our system was 59 in 2017. - LDL goal  <70 given CAD and PAD. - Intolerant to Lipitor and Crestor in the past. Continue Simvastatin 40mg  daily.  - Patient scheduled to see PCP soon for annual visit. Asked patient to have labs faxed to . If LDL above goal, would recommend referring patient to our lipid clinic to discuss other options. Of note, she is absolutely not interested in PCSK9 inhibitor due to fear of needles.  Type 2 Diabetes Mellitus - Management per PCP.  Disposition: Follow up in 3-4 months with Dr. .   Medication Adjustments/Labs and Tests Ordered: Current medicines are reviewed at length with the patient today.  Concerns regarding medicines are outlined above.  Orders Placed This Encounter  Procedures   EKG 12-Lead   VAS US CAROTID    No orders of the defined types were placed in this encounter.   Patient Instructions  Medication Instructions:  Restart Plavix *  If you need a refill on your cardiac medications before your next appointment, please call your pharmacy*   Lab Work: No Labs If you have labs (blood work) drawn today and your tests are completely normal, you will receive your results only by: MyChart Message (if you have MyChart) OR A paper copy in the mail If you have any lab test that is abnormal or we need to change your  treatment, we will call you to review the results.   Testing/Procedures: [address], [address] Your physician has requested that you have a carotid duplex. This test is an ultrasound of the carotid arteries in your neck. It looks at blood flow through these arteries that supply the brain with blood. Allow one hour for this exam. There are no restrictions or special instructions.    Follow-Up: At [facility], you and your health needs are our priority.  As part of our continuing mission to provide you with exceptional heart care, we have created designated Provider Care Teams.  These Care Teams include your primary Cardiologist (physician) and Advanced Practice Providers (APPs -  Physician Assistants and Nurse Practitioners) who all work together to provide you with the care you need, when you need it.  We recommend signing up for the patient portal called "MyChart".  Sign up information is provided on this After Visit Summary.  MyChart is used to connect with patients for Virtual Visits (Telemedicine).  Patients are able to view lab/test results, encounter notes, upcoming appointments, etc.  Non-urgent messages can be sent to your provider as well.   To learn more about what you can do with MyChart, go to [email or web address].    Your next appointment:   3-4 month(s)  The format for your next appointment:   In Person  Provider:   Charlton Haws, MD       Signed, Corrin Parker, PA-C  09/26/2021 3:00 PM    [facility]

## 2021-09-26 ENCOUNTER — Ambulatory Visit: Payer: Medicare Other | Admitting: Student

## 2021-09-26 ENCOUNTER — Encounter: Payer: Self-pay | Admitting: Student

## 2021-09-26 ENCOUNTER — Other Ambulatory Visit: Payer: Self-pay

## 2021-09-26 VITALS — BP 138/65 | HR 65 | Ht 62.0 in | Wt 198.0 lb

## 2021-09-26 DIAGNOSIS — I739 Peripheral vascular disease, unspecified: Secondary | ICD-10-CM | POA: Diagnosis not present

## 2021-09-26 DIAGNOSIS — E118 Type 2 diabetes mellitus with unspecified complications: Secondary | ICD-10-CM

## 2021-09-26 DIAGNOSIS — I480 Paroxysmal atrial fibrillation: Secondary | ICD-10-CM

## 2021-09-26 DIAGNOSIS — I2510 Atherosclerotic heart disease of native coronary artery without angina pectoris: Secondary | ICD-10-CM

## 2021-09-26 DIAGNOSIS — I6523 Occlusion and stenosis of bilateral carotid arteries: Secondary | ICD-10-CM | POA: Diagnosis not present

## 2021-09-26 DIAGNOSIS — E785 Hyperlipidemia, unspecified: Secondary | ICD-10-CM

## 2021-09-26 DIAGNOSIS — I10 Essential (primary) hypertension: Secondary | ICD-10-CM | POA: Diagnosis not present

## 2021-09-26 NOTE — Patient Instructions (Signed)
Medication Instructions:  Restart Plavix *If you need a refill on your cardiac medications before your next appointment, please call your pharmacy*   Lab Work: No Labs If you have labs (blood work) drawn today and your tests are completely normal, you will receive your results only by: MyChart Message (if you have MyChart) OR A paper copy in the mail If you have any lab test that is abnormal or we need to change your treatment, we will call you to review the results.   Testing/Procedures: [address], [address] Your physician has requested that you have a carotid duplex. This test is an ultrasound of the carotid arteries in your neck. It looks at blood flow through these arteries that supply the brain with blood. Allow one hour for this exam. There are no restrictions or special instructions.    Follow-Up: At [facility], you and your health needs are our priority.  As part of our continuing mission to provide you with exceptional heart care, we have created designated Provider Care Teams.  These Care Teams include your primary Cardiologist (physician) and Advanced Practice Providers (APPs -  Physician Assistants and Nurse Practitioners) who all work together to provide you with the care you need, when you need it.  We recommend signing up for the patient portal called "MyChart".  Sign up information is provided on this After Visit Summary.  MyChart is used to connect with patients for Virtual Visits (Telemedicine).  Patients are able to view lab/test results, encounter notes, upcoming appointments, etc.  Non-urgent messages can be sent to your provider as well.   To learn more about what you can do with MyChart, go to [email or web address].    Your next appointment:   3-4 month(s)  The format for your next appointment:   In Person  Provider:   Charlton Haws, MD

## 2021-09-29 ENCOUNTER — Ambulatory Visit (HOSPITAL_COMMUNITY)
Admission: RE | Admit: 2021-09-29 | Discharge: 2021-09-29 | Disposition: A | Discharge status: Home / Self Care | Payer: Medicare Other | Source: Ambulatory Visit | Attending: Cardiology | Admitting: Cardiology

## 2021-09-29 ENCOUNTER — Other Ambulatory Visit: Payer: Self-pay

## 2021-09-29 DIAGNOSIS — I739 Peripheral vascular disease, unspecified: Secondary | ICD-10-CM | POA: Diagnosis present

## 2021-09-29 DIAGNOSIS — E785 Hyperlipidemia, unspecified: Secondary | ICD-10-CM | POA: Diagnosis present

## 2021-09-29 DIAGNOSIS — I480 Paroxysmal atrial fibrillation: Secondary | ICD-10-CM | POA: Insufficient documentation

## 2021-09-29 DIAGNOSIS — I10 Essential (primary) hypertension: Secondary | ICD-10-CM | POA: Insufficient documentation

## 2021-09-29 DIAGNOSIS — E118 Type 2 diabetes mellitus with unspecified complications: Secondary | ICD-10-CM | POA: Insufficient documentation

## 2021-09-29 DIAGNOSIS — I2510 Atherosclerotic heart disease of native coronary artery without angina pectoris: Secondary | ICD-10-CM | POA: Diagnosis present

## 2021-09-29 DIAGNOSIS — Z8673 Personal history of transient ischemic attack (TIA), and cerebral infarction without residual deficits: Secondary | ICD-10-CM | POA: Diagnosis not present

## 2021-09-29 NOTE — Progress Notes (Signed)
Office Visit Note  Patient: Margaret Hobbs             Date of Birth: [birth year]           MRN: [id number]             PCP: Cleatis Polka., MD Referring: Cleatis Polka., MD Visit Date: 10/13/2021 Occupation: @GUAROCC @  Subjective:  Medication management   History of Present Illness: Margaret Hobbs is a 70 y.o. female with a history of psoriatic arthritis, psoriasis and osteoarthritis.  She states she continues to have some discomfort in her right hand over the second MCP joint and the right CMC joint.  She has not seen any swelling.  She has difficulty walking due to previous stroke.  She denies any history of Achilles tendinitis, Planter fasciitis or uveitis.  She has 1 psoriasis lesion in her left ear canal.  Activities of Daily Living:  Patient reports morning stiffness for 2 minutes.   Patient Denies nocturnal pain.  Difficulty dressing/grooming: Reports Difficulty climbing stairs: Reports Difficulty getting out of chair: Denies Difficulty using hands for taps, buttons, cutlery, and/or writing: Reports  Review of Systems  Constitutional:  Positive for fatigue.  HENT:  Positive for nosebleeds. Negative for mouth sores, mouth dryness and nose dryness.   Eyes:  Negative for pain, itching and dryness.  Respiratory:  Negative for shortness of breath and difficulty breathing.   Cardiovascular:  Negative for chest pain and palpitations.  Gastrointestinal:  Positive for constipation. Negative for blood in stool and diarrhea.  Endocrine: Negative for increased urination.  Genitourinary:  Negative for difficulty urinating.  Musculoskeletal:  Positive for joint pain, joint pain, joint swelling and morning stiffness. Negative for myalgias, muscle tenderness and myalgias.  Skin:  Positive for rash. Negative for color change.  Allergic/Immunologic: Negative for susceptible to infections.  Neurological:  Negative for dizziness, numbness, headaches, memory loss and  weakness.  Hematological:  Positive for bruising/bleeding tendency.  Psychiatric/Behavioral:  Negative for confusion.    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) 05/06/2020   Round hole of right eye 05/06/2020   Severe nonproliferative diabetic retinopathy of left eye (HCC) 05/06/2020   Retinal hemorrhage of left eye 05/06/2020   Peripheral arterial disease (HCC) 10/17/2019   Carotid artery disease (HCC) 01/24/2017   Primary osteoarthritis of both knees 11/29/2016   Other psoriasis 11/28/2016   Psoriatic arthritis (HCC) 11/28/2016   High risk medication use 11/28/2016   Type 2 diabetes mellitus (HCC) 03/30/2009   Hyperlipidemia 03/30/2009   Dyslipidemia 03/30/2009   Essential hypertension 03/30/2009   History of MI (myocardial infarction) 03/30/2009   Coronary artery disease involving native coronary artery of native heart without angina pectoris 03/30/2009   Cerebral artery occlusion with cerebral infarction (HCC) 03/30/2009   URINARY TRACT INFECTION 03/30/2009   ARTHRITIS 03/30/2009   HYPERPARATHYROIDISM, HX OF 03/30/2009    Past Medical History:  Diagnosis Date   Aphasia    Arthritis    CAD (coronary artery disease)    Cerebrovascular disease    CVA (cerebral vascular accident) (HCC)    Depressed    Diabetes mellitus    DM2 (diabetes mellitus, type 2) (HCC)    HLD (hyperlipidemia)    HTN (hypertension)    Hypercholesteremia    Hyperparathyroidism    MI (myocardial  infarction) (HCC)    Osteoporosis    TIA (transient ischemic attack)    UTI (urinary tract infection)     Family History  Problem Relation Age of Onset   Heart disease Mother        pacemaker   Hypertension Mother    Hyperlipidemia Mother    Diabetes Brother        pre-diabetic    Hypertension Son    Sleep  apnea Son    Diabetes Other    Hypertension Other    Alcohol abuse Other    Coronary artery disease Other    Past Surgical History:  Procedure Laterality Date   ABDOMINAL AORTOGRAM W/LOWER EXTREMITY Bilateral 03/31/2021   Procedure: ABDOMINAL AORTOGRAM W/LOWER EXTREMITY;  Surgeon: Runell Gess, MD;  Location: [facility];  Service: Cardiovascular;  Laterality: Bilateral;   ABDOMINAL HYSTERECTOMY     CATARACT EXTRACTION W/PHACO Bilateral 2017   Dr. Alben Spittle   hysterectomy - unknown type     Social History   Social History Narrative   Not on file   Immunization History  Administered Date(s) Administered   PFIZER(Purple Top)SARS-COV-2 Vaccination 02/16/2020, 03/08/2020, 11/05/2020     Objective: Vital Signs: BP (!) 150/77 (BP Location: Right Arm, Patient Position: Sitting, Cuff Size: Large)   Pulse 67   Ht 5\' 2"  (1.575 m)   Wt 201 lb (91.2 kg)   BMI 36.76 kg/m    Physical Exam Vitals and nursing note reviewed.  Constitutional:      Appearance: She is well-developed.  HENT:     Head: Normocephalic and atraumatic.  Eyes:     Conjunctiva/sclera: Conjunctivae normal.  Cardiovascular:     Rate and Rhythm: Normal rate and regular rhythm.     Heart sounds: Normal heart sounds.  Pulmonary:     Effort: Pulmonary effort is normal.     Breath sounds: Normal breath sounds.  Abdominal:     General: Bowel sounds are normal.     Palpations: Abdomen is soft.  Musculoskeletal:     Cervical back: Normal range of motion.  Lymphadenopathy:     Cervical: No cervical adenopathy.  Skin:    General: Skin is warm and dry.     Capillary Refill: Capillary refill takes less than 2 seconds.     Comments: Psoriasis patch was noted in the left ear canal.  Neurological:     Mental Status: She is alert and oriented to person, place, and time.     Comments: Right-sided hemiparesis due to previous stroke.  Psychiatric:        Behavior: Behavior normal.     Musculoskeletal Exam:  C-spine was in good range of motion.  She had some thoracic kyphosis.  There was no tenderness over thoracic or lumbar region.  There was no tenderness over SI joints.  Shoulder joints, elbow joints, wrist joints were in good range of motion.  She had no synovitis of her wrist joints MCPs PIPs or DIPs.  Hip joints and knee joints with good range of motion.  There was no tenderness over ankles or MTPs.  CDAI Exam: CDAI Score: -- Patient Global: --; Provider Global: -- Swollen: --; Tender: -- Joint Exam 10/13/2021   No joint exam has been documented for this visit   There is currently no information documented on the homunculus. Go to the Rheumatology activity and complete the homunculus joint exam.  Investigation: No additional findings.  Imaging: VAS 10/15/2021 CAROTID  Result Date: 09/30/2021 Carotid Arterial Duplex Study  Patient Name:  Margaret Hobbs  Date of Exam:   09/29/2021 Medical Rec #: [id number]             Accession #:    [id number] Date of Birth: May 16, 1951             Patient Gender: F Patient Age:   6 years Exam Location:  [facility] Procedure:      VAS US CAROTID Referring Phys: Marjie Skiff --------------------------------------------------------------------------------  Indications:       Carotid artery disease and Patient denies any cerebrovascular                    symptoms today except of right sided weakness with gait after                    CVA in 2008. Risk Factors:      Hypertension, hyperlipidemia, Diabetes, no history of                    smoking, prior MI, coronary artery disease, prior CVA, PAD. Comparison Study:  Prior carotid duplex exam on 03/03/2019 showed highest                    velocities in right proximal ICA 92/25 cm/s and left proximal                    ICA 66/23 cm/s. Performing Technologist: Carlos American RVT, RDCS (AE), RDMS  Examination Guidelines: A complete evaluation includes B-mode imaging, spectral Doppler, color Doppler, and power Doppler as needed  of all accessible portions of each vessel. Bilateral testing is considered an integral part of a complete examination. Limited examinations for reoccurring indications may be performed as noted.  Right Carotid Findings: +----------+--------+--------+--------+------------------+---------+           PSV cm/sEDV cm/sStenosisPlaque DescriptionComments  +----------+--------+--------+--------+------------------+---------+ CCA Prox  94      15                                          +----------+--------+--------+--------+------------------+---------+ CCA Distal82      19              focal and calcific          +----------+--------+--------+--------+------------------+---------+ ICA Prox  89      26              focal and calcific          +----------+--------+--------+--------+------------------+---------+ ICA Mid   69      16                                          +----------+--------+--------+--------+------------------+---------+ ICA Distal89      29                                          +----------+--------+--------+--------+------------------+---------+ ECA       128     11              focal and calcificshadowing +----------+--------+--------+--------+------------------+---------+ +----------+--------+-------+----------------+-------------------+           PSV cm/sEDV cmsDescribe        Arm  Pressure (mmHG) +----------+--------+-------+----------------+-------------------+ [id number]            Multiphasic, [id number]                 +----------+--------+-------+----------------+-------------------+ +---------+--------+--+--------+-+--------------------------------------------+ VertebralPSV cm/s72EDV cm/s9Antegrade, High resistant and small in                                   caliber                                      +---------+--------+--+--------+-+--------------------------------------------+  Left Carotid Findings:  +----------+--------+--------+--------+------------------+--------+           PSV cm/sEDV cm/sStenosisPlaque DescriptionComments +----------+--------+--------+--------+------------------+--------+ CCA Prox  109     18                                         +----------+--------+--------+--------+------------------+--------+ CCA Distal77      18                                         +----------+--------+--------+--------+------------------+--------+ ICA Prox  78      18              focal and calcific         +----------+--------+--------+--------+------------------+--------+ ICA Mid   76      22                                         +----------+--------+--------+--------+------------------+--------+ ICA Distal92      34                                         +----------+--------+--------+--------+------------------+--------+ ECA       155     9                                          +----------+--------+--------+--------+------------------+--------+ +----------+--------+--------+----------------+-------------------+           PSV cm/sEDV cm/sDescribe        Arm Pressure (mmHG) +----------+--------+--------+----------------+-------------------+ [id number]             Multiphasic, [id number]                 +----------+--------+--------+----------------+-------------------+ +---------+--------+--+--------+-+--------------------------------------------+ VertebralPSV cm/s49EDV cm/s8Antegrade, High resistant and small in                                   caliber                                      +---------+--------+--+--------+-+--------------------------------------------+   Summary: Right Carotid: The extracranial vessels were near-normal with only minimal wall                thickening or plaque. Left Carotid:  The extracranial vessels were near-normal with only minimal wall               thickening or plaque. Vertebrals:  Bilateral  vertebral arteries demonstrate antegrade flow, high              resistant flow and are both small in caliber. Subclavians: Normal flow hemodynamics were seen in bilateral subclavian              arteries. *See table(s) above for measurements and observations.  Electronically signed by Dina Rich MD on 09/30/2021 at 4:10:54 PM.    Final     Recent Labs: Lab Results  Component Value Date   WBC 8.1 09/17/2021   HGB 13.0 09/17/2021   PLT 260 09/17/2021   NA 139 09/17/2021   K 4.4 09/17/2021   CL 103 09/17/2021   CO2 28 09/17/2021   GLUCOSE 157 (H) 09/17/2021   BUN 26 (H) 09/17/2021   CREATININE 0.99 09/17/2021   BILITOT 0.6 09/17/2021   ALKPHOS 37 (L) 09/17/2021   AST 24 09/17/2021   ALT 26 09/17/2021   PROT 6.7 09/17/2021   ALBUMIN 3.4 (L) 09/17/2021   CALCIUM 9.3 09/17/2021   GFRAA 69 07/01/2019   QFTBGOLDPLUS NEGATIVE 07/06/2021    Speciality Comments: No specialty comments available.  Procedures:  No procedures performed Allergies: Remicade [infliximab], Amiodarone, and Sulfa antibiotics   Assessment / Plan:     Visit Diagnoses: Psoriatic arthritis (HCC)-she complains of some discomfort over her right second MCP joint and right CMC joint.  No synovitis was noted.  She has been taking Humira every other week which she is tolerating well without any side effects.  Other psoriasis-she has a small psoriasis patch in her left ear canal which is nonbothersome.  High risk medication use - Humira 40 mg sq injections every 14 days.  Her labs from September 2022 were reviewed which were within normal limits.  TB gold was negative in July 2021.  Updated information regarding realization was given.  She was advised to hold Humira in case she develops an infection and may restart once infection resolves.  She was also advised to have yearly skin examination to screen for nonmelanoma skin cancer while she is on Humira.  Primary osteoarthritis of both knees-she has off-and-on discomfort  in her knee joints.  No warmth swelling or effusion was noted.  Age-related osteoporosis without current pathological fracture - Reclast is currently on hold-previously prescribed by Dr. Clelia Croft.   History of atrial fibrillation - History of PAD with symptomatic left calf claudication s/p angiography.  She is followed by Dr. Gery Pray.  History of stroke-right-sided hemiparesis.  Other medical problems are listed as follows:  History of diabetes mellitus  Dyslipidemia  History of hypercholesterolemia  History of coronary artery disease  History of hypertension  Cerebral artery occlusion with cerebral infarction (HCC)  Orders: No orders of the defined types were placed in this encounter.  No orders of the defined types were placed in this encounter.   Follow-Up Instructions: No follow-ups on file.   Margaret Savoy, MD  Note - This record has been created using Animal nutritionist.  Chart creation errors have been sought, but may not always  have been located. Such creation errors do not reflect on  the standard of medical care.

## 2021-10-13 ENCOUNTER — Ambulatory Visit: Payer: Medicare Other | Admitting: Rheumatology

## 2021-10-13 ENCOUNTER — Encounter: Payer: Self-pay | Admitting: Rheumatology

## 2021-10-13 ENCOUNTER — Other Ambulatory Visit: Payer: Self-pay

## 2021-10-13 VITALS — BP 150/77 | HR 67 | Ht 62.0 in | Wt 201.0 lb

## 2021-10-13 DIAGNOSIS — I6350 Cerebral infarction due to unspecified occlusion or stenosis of unspecified cerebral artery: Secondary | ICD-10-CM

## 2021-10-13 DIAGNOSIS — L4050 Arthropathic psoriasis, unspecified: Secondary | ICD-10-CM | POA: Diagnosis not present

## 2021-10-13 DIAGNOSIS — L408 Other psoriasis: Secondary | ICD-10-CM | POA: Diagnosis not present

## 2021-10-13 DIAGNOSIS — M810 Age-related osteoporosis without current pathological fracture: Secondary | ICD-10-CM

## 2021-10-13 DIAGNOSIS — M170 Bilateral primary osteoarthritis of knee: Secondary | ICD-10-CM

## 2021-10-13 DIAGNOSIS — Z8679 Personal history of other diseases of the circulatory system: Secondary | ICD-10-CM

## 2021-10-13 DIAGNOSIS — Z8673 Personal history of transient ischemic attack (TIA), and cerebral infarction without residual deficits: Secondary | ICD-10-CM

## 2021-10-13 DIAGNOSIS — E785 Hyperlipidemia, unspecified: Secondary | ICD-10-CM

## 2021-10-13 DIAGNOSIS — Z79899 Other long term (current) drug therapy: Secondary | ICD-10-CM | POA: Diagnosis not present

## 2021-10-13 DIAGNOSIS — Z8639 Personal history of other endocrine, nutritional and metabolic disease: Secondary | ICD-10-CM

## 2021-10-13 NOTE — Patient Instructions (Signed)
Standing Labs We placed an order today for your standing lab work.   Please have your standing labs drawn in December and every 3 months  If possible, please have your labs drawn 2 weeks prior to your appointment so that the provider can discuss your results at your appointment.  Please note that you may see your imaging and lab results in MyChart before we have reviewed them. We may be awaiting multiple results to interpret others before contacting you. Please allow our office up to 72 hours to thoroughly review all of the results before contacting the office for clarification of your results.  We have open lab daily: Monday through Thursday from 1:30-4:30 PM and Friday from 1:30-4:00 PM at the office of Dr. Pollyann Savoy, [facility].   Please be advised, all patients with office appointments requiring lab work will take precedent over walk-in lab work.  If possible, please come for your lab work on Monday and Friday afternoons, as you may experience shorter wait times. The office is located at [address], [address], [place], [place] [address] No appointment is necessary.   Labs are drawn by [facility]. Please bring your co-pay at the time of your lab draw.  You may receive a bill from [facility] for your lab work.  If you wish to have your labs drawn at another location, please call the office 24 hours in advance to send orders.  If you have any questions regarding directions or hours of operation,  please call [phone].   As a reminder, please drink plenty of water prior to coming for your lab work. Thanks!   Vaccines You are taking a medication(s) that can suppress your immune system.  The following immunizations are recommended: Flu annually Covid-19  Td/Tdap (tetanus, diphtheria, pertussis) every 10 years Pneumonia (Prevnar 15 then Pneumovax 23 at least 1 year apart.  Alternatively, can take Prevnar 20 without needing additional dose) Shingrix: 2 doses from 4  weeks to 6 months apart  Please check with your PCP to make sure you are up to date.  If you have signs or symptoms of an infection or start antibiotics: First, call your PCP for workup of your infection. Hold your medication through the infection, until you complete your antibiotics, and until symptoms resolve if you take the following: Injectable medication (Actemra, Benlysta, Cimzia, Cosentyx, Enbrel, Humira, Kevzara, Orencia, Remicade, Simponi, Stelara, Taltz, Tremfya) Methotrexate Leflunomide (Arava) Mycophenolate (Cellcept) Harriette Ohara, Olumiant, or Rinvoq  These get annual skin examination to screen for skin cancer while you are on Humira

## 2021-10-17 ENCOUNTER — Emergency Department (HOSPITAL_BASED_OUTPATIENT_CLINIC_OR_DEPARTMENT_OTHER)
Admission: EM | Admit: 2021-10-17 | Discharge: 2021-10-17 | Disposition: A | Discharge status: Home / Self Care | Payer: Medicare Other | Attending: Student | Admitting: Student

## 2021-10-17 ENCOUNTER — Encounter (HOSPITAL_BASED_OUTPATIENT_CLINIC_OR_DEPARTMENT_OTHER): Payer: Self-pay | Admitting: *Deleted

## 2021-10-17 ENCOUNTER — Other Ambulatory Visit: Payer: Self-pay

## 2021-10-17 DIAGNOSIS — Z79899 Other long term (current) drug therapy: Secondary | ICD-10-CM | POA: Insufficient documentation

## 2021-10-17 DIAGNOSIS — Z7902 Long term (current) use of antithrombotics/antiplatelets: Secondary | ICD-10-CM | POA: Insufficient documentation

## 2021-10-17 DIAGNOSIS — Z7982 Long term (current) use of aspirin: Secondary | ICD-10-CM | POA: Diagnosis not present

## 2021-10-17 DIAGNOSIS — E119 Type 2 diabetes mellitus without complications: Secondary | ICD-10-CM | POA: Insufficient documentation

## 2021-10-17 DIAGNOSIS — Z7901 Long term (current) use of anticoagulants: Secondary | ICD-10-CM | POA: Diagnosis not present

## 2021-10-17 DIAGNOSIS — R040 Epistaxis: Secondary | ICD-10-CM | POA: Insufficient documentation

## 2021-10-17 DIAGNOSIS — Z7984 Long term (current) use of oral hypoglycemic drugs: Secondary | ICD-10-CM | POA: Diagnosis not present

## 2021-10-17 DIAGNOSIS — I2510 Atherosclerotic heart disease of native coronary artery without angina pectoris: Secondary | ICD-10-CM | POA: Insufficient documentation

## 2021-10-17 DIAGNOSIS — I10 Essential (primary) hypertension: Secondary | ICD-10-CM | POA: Insufficient documentation

## 2021-10-17 MED ORDER — OXYMETAZOLINE HCL 0.05 % NA SOLN
1.0000 | Freq: Once | NASAL | Status: AC
  Administered 2021-10-17: 1 via NASAL
  Filled 2021-10-17: qty 30

## 2021-10-17 NOTE — Discharge Instructions (Addendum)
You were seen in the emergency department for evaluation of a nosebleed.  Your nosebleed was controlled in the emergency department using an intranasal device known as a [employer].  This will require removal by ENT in 24 to 48 hours.  Please call the number above for Dr. Jearld Fenton who will help schedule your follow-up appointment.  I also left a message for them to call you as well.  If you have any difficulty, contact your primary care physician who will help facilitate this removal.  Return the emergency department if you have new or persistent bleeding around the [facility], vomiting, fever or any other concerning symptoms.

## 2021-10-17 NOTE — ED Triage Notes (Signed)
Pt has had a nosebleed since about 0630. EMS has been out to evaluate and she has been to her PCP. Continues to bleed. Pt is on blood thinners.

## 2021-10-17 NOTE — ED Provider Notes (Signed)
[facility] EMERGENCY DEPT Provider Note   CSN: [id number] Arrival date & time: 10/17/21  1025     History Chief Complaint  Patient presents with   Epistaxis    Margaret Hobbs is a 70 y.o. female with PMH CAD status post MI with DES on aspirin Plavix, CVA, A. fib on Eliquis who presents the emergency department for evaluation of epistaxis.  Patient states that at approximately 6:30 AM this morning she had spontaneous epistaxis in the right nare.  She previously required cauterization with silver nitrate.  Denies chest pain, shortness of breath, abdominal pain, nausea, vomiting or other systemic symptoms.  Patient states her last dose of her blood thinning medications were this morning.   Epistaxis Associated symptoms: no cough, no fever and no sore throat       Past Medical History:  Diagnosis Date   Aphasia    Arthritis    CAD (coronary artery disease)    Cerebrovascular disease    CVA (cerebral vascular accident) (HCC)    Depressed    Diabetes mellitus    DM2 (diabetes mellitus, type 2) (HCC)    HLD (hyperlipidemia)    HTN (hypertension)    Hypercholesteremia    Hyperparathyroidism    MI (myocardial infarction) (HCC)    Osteoporosis    TIA (transient ischemic attack)    UTI (urinary tract infection)     Patient Active Problem List   Diagnosis Date Noted   Pseudophakia of both eyes 04/28/2021   Claudication in peripheral vascular disease (HCC) 03/31/2021   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) 05/06/2020   Round hole of right eye 05/06/2020   Severe nonproliferative diabetic retinopathy of left eye (HCC) 05/06/2020   Retinal hemorrhage of left eye 05/06/2020   Peripheral arterial disease (HCC) 10/17/2019   Carotid artery disease (HCC) 01/24/2017   Primary osteoarthritis of both knees 11/29/2016   Other psoriasis 11/28/2016   Psoriatic arthritis (HCC) 11/28/2016   High risk medication use 11/28/2016   Type 2  diabetes mellitus (HCC) 03/30/2009   Hyperlipidemia 03/30/2009   Dyslipidemia 03/30/2009   Essential hypertension 03/30/2009   History of MI (myocardial infarction) 03/30/2009   Coronary artery disease involving native coronary artery of native heart without angina pectoris 03/30/2009   Cerebral artery occlusion with cerebral infarction (HCC) 03/30/2009   URINARY TRACT INFECTION 03/30/2009   ARTHRITIS 03/30/2009   HYPERPARATHYROIDISM, HX OF 03/30/2009    Past Surgical History:  Procedure Laterality Date   ABDOMINAL AORTOGRAM W/LOWER EXTREMITY Bilateral 03/31/2021   Procedure: ABDOMINAL AORTOGRAM W/LOWER EXTREMITY;  Surgeon: Runell Gess, MD;  Location: [facility];  Service: Cardiovascular;  Laterality: Bilateral;   ABDOMINAL HYSTERECTOMY     CATARACT EXTRACTION W/PHACO Bilateral 2017   Dr. Alben Spittle   hysterectomy - unknown type       OB History     Gravida      Para      Term      Preterm      AB      Living  1      SAB      IAB      Ectopic      Multiple      Live Births              Family History  Problem Relation Age of Onset   Heart disease Mother        pacemaker   Hypertension Mother    Hyperlipidemia Mother  Diabetes Brother        pre-diabetic    Hypertension Son    Sleep apnea Son    Diabetes Other    Hypertension Other    Alcohol abuse Other    Coronary artery disease Other     Social History   Tobacco Use   Smoking status: Never   Smokeless tobacco: Never  Vaping Use   Vaping Use: Never used  Substance Use Topics   Alcohol use: No   Drug use: Never    Home Medications Prior to Admission medications   Medication Sig Start Date End Date Taking? Authorizing Provider  amoxicillin (AMOXIL) 500 MG capsule Take 2,000 mg by mouth as directed. Take 4 capsules (2000 mg) by mouth 1 hour prior to dental procedures 01/30/18   [provider]  apixaban (ELIQUIS) 5 MG TABS tablet Take 1 tablet (5 mg total) by mouth 2  (two) times daily. 05/02/21   Runell Gess, MD  Ascorbic Acid (VITAMIN C) 500 MG tablet Take 500 mg by mouth 3 (three) times a week.    [provider]  cholecalciferol (VITAMIN D) 1000 UNITS tablet Take 1,000 Units by mouth in the morning.    [provider]  clopidogrel (PLAVIX) 75 MG tablet TAKE 1 TABLET BY MOUTH  DAILY 07/28/21   Wendall Stade, MD  Coenzyme Q10 100 MG TABS Take 100 mg by mouth in the morning.    [provider]  ezetimibe (ZETIA) 10 MG tablet Take 10 mg by mouth in the morning. 11/19/20   [provider]  folic acid (FOLVITE) 1 MG tablet TAKE 2 TABLETS (2MG ) BY  MOUTH IN THE MORNING 08/25/21   10/25/21, PA-C  glipiZIDE-metformin (METAGLIP) 5-500 MG tablet Take 1 tablet by mouth 2 (two) times daily before a meal. 04/02/21   06/02/21, PA-C  HUMIRA 40 MG/0.4ML PSKT INJECT 40MG  SUBCUTANEOUSLY  EVERY OTHER WEEK 07/04/21   , MD  hydrALAZINE (APRESOLINE) 50 MG tablet TAKE 1 TABLET BY MOUTH IN  THE MORNING AND AT BEDTIME 03/31/21   Pollyann Savoy, MD  hydrALAZINE (APRESOLINE) 50 MG tablet Take 50 mg by mouth once. 1 Tablet at Bedtime Patient not taking: Reported on 10/13/2021    [provider]  metoprolol succinate (TOPROL-XL) 50 MG 24 hr tablet Take 1 tablet (50 mg total) by mouth daily. Take with or immediately following a meal. 04/05/21 03/31/22  06/05/21, MD  neomycin-polymyxin-hydrocortisone (CORTISPORIN) 3.5-10000-1 OTIC suspension Apply 1-2 drops daily after soaking and cover with bandaid Patient not taking: No sig reported 08/04/21   08-20-2003, DPM  nitroGLYCERIN (NITROSTAT) 0.4 MG SL tablet PLACE 1 TABLET UNDER THE TONGUE EVERY 5 (FIVE) MINUTES AS NEEDED FOR CHEST PAIN (3 DOSES MAX) Patient not taking: Reported on 10/13/2021 04/30/18   10/15/2021, MD  olmesartan-hydrochlorothiazide (BENICAR HCT) 40-25 MG tablet Take 1 tablet by mouth in the morning. 10/07/19   [provider]   potassium chloride SA (KLOR-CON) 20 MEQ tablet Take 20 mEq by mouth 2 (two) times daily.    [provider]  simvastatin (ZOCOR) 40 MG tablet Take 1 tablet (40 mg total) by mouth at bedtime. 08/02/21   10/09/19, MD  VITAMIN A PO Take 3,000 Units by mouth daily.    [provider]  vitamin E 400 UNIT capsule Take 400 Units by mouth in the morning.    [provider]    Allergies  Remicade [infliximab], Amiodarone, and Sulfa antibiotics  Review of Systems   Review of Systems  Constitutional:  Negative for chills and fever.  HENT:  Positive for nosebleeds. Negative for ear pain and sore throat.   Eyes:  Negative for pain and visual disturbance.  Respiratory:  Negative for cough and shortness of breath.   Cardiovascular:  Negative for chest pain and palpitations.  Gastrointestinal:  Negative for abdominal pain and vomiting.  Genitourinary:  Negative for dysuria and hematuria.  Musculoskeletal:  Negative for arthralgias and back pain.  Skin:  Negative for color change and rash.  Neurological:  Negative for seizures and syncope.  All other systems reviewed and are negative.  Physical Exam Updated Vital Signs BP (!) 171/64 (BP Location: Right Arm)   Pulse 70   Temp 98.4 F (36.9 C)   Resp 18   Ht 5\' 2"  (1.575 m)   Wt 90.7 kg   SpO2 99%   BMI 36.58 kg/m   Physical Exam Vitals and nursing note reviewed.  Constitutional:      General: She is not in acute distress.    Appearance: She is well-developed.  HENT:     Head: Normocephalic and atraumatic.     Comments: Right anterior epistaxis Eyes:     Conjunctiva/sclera: Conjunctivae normal.  Cardiovascular:     Rate and Rhythm: Normal rate and regular rhythm.     Heart sounds: No murmur heard. Pulmonary:     Effort: Pulmonary effort is normal. No respiratory distress.     Breath sounds: Normal breath sounds.  Abdominal:     Palpations: Abdomen is soft.     Tenderness: There is no abdominal  tenderness.  Musculoskeletal:     Cervical back: Neck supple.  Skin:    General: Skin is warm and dry.  Neurological:     Mental Status: She is alert.    ED Results / Procedures / Treatments   Labs (all labs ordered are listed, but only abnormal results are displayed) Labs Reviewed - No data to display  EKG None  Radiology No results found.  Procedures .Epistaxis Management  Date/Time: 10/17/2021 4:13 PM Performed by: 10/19/2021, MD Authorized by: Glendora Score, MD   Anesthesia:    Anesthesia method:  None Procedure details:    Treatment site:  R anterior   Treatment method:  Nasal balloon Post-procedure details:    Assessment:  Bleeding stopped   Procedure completion:  Tolerated well, no immediate complications   Medications Ordered in ED Medications  oxymetazoline (AFRIN) 0.05 % nasal spray 1 spray (1 spray Each Nare Given 10/17/21 1056)    ED Course  I have reviewed the triage vital signs and the nursing notes.  Pertinent labs & imaging results that were available during my care of the patient were reviewed by me and considered in my medical decision making (see chart for details).    MDM Rules/Calculators/A&P                          Patient seen the emergency department for evaluation of epistaxis.  Physical exam reveals an anterior bleed in the right nare With no evidence of a posterior bleed.  Afrin was applied to the bleeding nostril which led to transient relief of the epistaxis, but unfortunately on reevaluation the patient began to rebleed.  Afrin soaked cotton was packed in the nare that did not relieve the bleeding either.  A Rhino Rocket was then placed in the  right nare which led to resolution of the patient's bleeding.  I spoke with ENT who instructed me to call the outpatient referral line for follow-up of Rhino Rocket placement and removal in 24 to 48 hours.  Patient was then discharged with outpatient ENT follow-up.  She was given strict  return precautions of which she voiced understanding and was discharged.  Final Clinical Impression(s) / ED Diagnoses Final diagnoses:  Epistaxis    Rx / DC Orders ED Discharge Orders     None        Arlester Keehan, Wyn Forster, MD 10/17/21 1615

## 2021-10-27 ENCOUNTER — Other Ambulatory Visit: Payer: Self-pay | Admitting: Cardiovascular Disease

## 2021-10-27 NOTE — Telephone Encounter (Signed)
Prescription refill request for Eliquis received. Indication:Afib Last office visit:10/22 Scr:0.9 Age: 70 Weight:90.7 kg  Prescription refilled

## 2021-11-03 ENCOUNTER — Encounter (INDEPENDENT_AMBULATORY_CARE_PROVIDER_SITE_OTHER): Payer: Medicare Other | Admitting: Ophthalmology

## 2021-11-03 ENCOUNTER — Ambulatory Visit (INDEPENDENT_AMBULATORY_CARE_PROVIDER_SITE_OTHER): Payer: Medicare Other | Admitting: Ophthalmology

## 2021-11-03 ENCOUNTER — Other Ambulatory Visit: Payer: Self-pay

## 2021-11-03 ENCOUNTER — Encounter (INDEPENDENT_AMBULATORY_CARE_PROVIDER_SITE_OTHER): Payer: Self-pay | Admitting: Ophthalmology

## 2021-11-03 DIAGNOSIS — E113491 Type 2 diabetes mellitus with severe nonproliferative diabetic retinopathy without macular edema, right eye: Secondary | ICD-10-CM

## 2021-11-03 DIAGNOSIS — E113492 Type 2 diabetes mellitus with severe nonproliferative diabetic retinopathy without macular edema, left eye: Secondary | ICD-10-CM

## 2021-11-03 NOTE — Assessment & Plan Note (Signed)
No progression overtly of severe NPDR OD Will observe, no active maculopathy

## 2021-11-03 NOTE — Assessment & Plan Note (Signed)
No progression of severe NPDR, no maculopathy active

## 2021-11-03 NOTE — Progress Notes (Signed)
11/03/2021     CHIEF COMPLAINT Patient presents for  Chief Complaint  Patient presents with   Retina Follow Up    No interval change in vision  HISTORY OF PRESENT ILLNESS: Margaret Hobbs is a 70 y.o. female who presents to the clinic today for:   HPI     Retina Follow Up   Patient presents with  Diabetic Retinopathy.  In both eyes.  This started 6 months ago.  Duration of 6 months.  Since onset it is stable.        Comments   6 month f/u OU with FP      Last edited by Reather Littler, COA on 11/03/2021 10:28 AM.      Referring physician: Ginger Organ., MD [place],  [place] [id number]  HISTORICAL INFORMATION:   Selected notes from the MEDICAL RECORD NUMBER    Lab Results  Component Value Date   HGBA1C 7.2 (H) 04/01/2021     CURRENT MEDICATIONS: No current outpatient medications on file. (Ophthalmic Drugs)   No current facility-administered medications for this visit. (Ophthalmic Drugs)   Current Outpatient Medications (Other)  Medication Sig   amoxicillin (AMOXIL) 500 MG capsule Take 2,000 mg by mouth as directed. Take 4 capsules (2000 mg) by mouth 1 hour prior to dental procedures   Ascorbic Acid (VITAMIN C) 500 MG tablet Take 500 mg by mouth 3 (three) times a week.   cholecalciferol (VITAMIN D) 1000 UNITS tablet Take 1,000 Units by mouth in the morning.   clopidogrel (PLAVIX) 75 MG tablet TAKE 1 TABLET BY MOUTH  DAILY   Coenzyme Q10 100 MG TABS Take 100 mg by mouth in the morning.   ELIQUIS 5 MG TABS tablet TAKE 1 TABLET BY MOUTH TWICE A DAY   ezetimibe (ZETIA) 10 MG tablet Take 10 mg by mouth in the morning.   folic acid (FOLVITE) 1 MG tablet TAKE 2 TABLETS (2MG ) BY  MOUTH IN THE MORNING   glipiZIDE-metformin (METAGLIP) 5-500 MG tablet Take 1 tablet by mouth 2 (two) times daily before a meal.   HUMIRA 40 MG/0.4ML PSKT INJECT 40MG  SUBCUTANEOUSLY  EVERY OTHER WEEK   hydrALAZINE (APRESOLINE) 50 MG tablet TAKE 1 TABLET BY MOUTH  IN  THE MORNING AND AT BEDTIME   hydrALAZINE (APRESOLINE) 50 MG tablet Take 50 mg by mouth once. 1 Tablet at Bedtime (Patient not taking: Reported on 10/13/2021)   metoprolol succinate (TOPROL-XL) 50 MG 24 hr tablet Take 1 tablet (50 mg total) by mouth daily. Take with or immediately following a meal.   neomycin-polymyxin-hydrocortisone (CORTISPORIN) 3.5-10000-1 OTIC suspension Apply 1-2 drops daily after soaking and cover with bandaid (Patient not taking: No sig reported)   nitroGLYCERIN (NITROSTAT) 0.4 MG SL tablet PLACE 1 TABLET UNDER THE TONGUE EVERY 5 (FIVE) MINUTES AS NEEDED FOR CHEST PAIN (3 DOSES MAX) (Patient not taking: Reported on 10/13/2021)   olmesartan-hydrochlorothiazide (BENICAR HCT) 40-25 MG tablet Take 1 tablet by mouth in the morning.   potassium chloride SA (KLOR-CON) 20 MEQ tablet Take 20 mEq by mouth 2 (two) times daily.   simvastatin (ZOCOR) 40 MG tablet Take 1 tablet (40 mg total) by mouth at bedtime.   VITAMIN A PO Take 3,000 Units by mouth daily.   vitamin E 400 UNIT capsule Take 400 Units by mouth in the morning.   Current Facility-Administered Medications (Other)  Medication Route   sodium chloride flush (NS) 0.9 % injection 3 mL Intravenous  REVIEW OF SYSTEMS:    ALLERGIES Allergies  Allergen Reactions   Remicade [Infliximab] Other (See Comments)    Convulsion and felt cold    Amiodarone     Per pr report she was dizzy, weak and lethargic   Sulfa Antibiotics Rash    PAST MEDICAL HISTORY Past Medical History:  Diagnosis Date   Aphasia    Arthritis    CAD (coronary artery disease)    Cerebrovascular disease    CVA (cerebral vascular accident) ([place])    Depressed    Diabetes mellitus    DM2 (diabetes mellitus, type 2) ([place])    HLD (hyperlipidemia)    HTN (hypertension)    Hypercholesteremia    Hyperparathyroidism    MI (myocardial infarction) ([place])    Osteoporosis    TIA (transient ischemic attack)    UTI (urinary tract infection)     Past Surgical History:  Procedure Laterality Date   ABDOMINAL AORTOGRAM W/LOWER EXTREMITY Bilateral 03/31/2021   Procedure: ABDOMINAL AORTOGRAM W/LOWER EXTREMITY;  Surgeon: Lorretta Harp, MD;  Location: [facility];  Service: Cardiovascular;  Laterality: Bilateral;   ABDOMINAL HYSTERECTOMY     CATARACT EXTRACTION W/PHACO Bilateral 2017   Dr. Kathlen Mody   hysterectomy - unknown type      FAMILY HISTORY Family History  Problem Relation Age of Onset   Heart disease Mother        pacemaker   Hypertension Mother    Hyperlipidemia Mother    Diabetes Brother        pre-diabetic    Hypertension Son    Sleep apnea Son    Diabetes Other    Hypertension Other    Alcohol abuse Other    Coronary artery disease Other     SOCIAL HISTORY Social History   Tobacco Use   Smoking status: Never   Smokeless tobacco: Never  Vaping Use   Vaping Use: Never used  Substance Use Topics   Alcohol use: No   Drug use: Never         OPHTHALMIC EXAM:  Base Eye Exam     Visual Acuity (ETDRS)       Right Left   Dist cc 20/20 20/20 -1    Correction: Glasses         Tonometry (Tonopen, 10:35 AM)       Right Left   Pressure 13 13         Pupils       Pupils Dark Light Shape React APD   Right PERRL 3 2 Round Brisk None   Left PERRL 3 2 Round Brisk None         Visual Fields (Counting fingers)       Left Right    Full Full         Extraocular Movement       Right Left    Full, Ortho Full, Ortho         Neuro/Psych     Oriented x3: Yes   Mood/Affect: Normal         Dilation     Both eyes: 1.0% Mydriacyl, 2.5% Phenylephrine @ 10:33 AM           Slit Lamp and Fundus Exam     External Exam       Right Left   External Normal Normal         Slit Lamp Exam       Right Left   Lids/Lashes Normal Normal  Conjunctiva/Sclera White and quiet White and quiet   Cornea Clear Clear   Anterior Chamber Deep and quiet Deep and quiet   Iris  Round and reactive Round and reactive   Lens Posterior chamber intraocular lens Posterior chamber intraocular lens   Anterior Vitreous Normal Normal         Fundus Exam       Right Left   Posterior Vitreous Normal Normal   Disc Normal Normal   C/D Ratio 0.45 0.55   Macula Microaneurysms, no macular thickening Microaneurysms, no macular thickening   Vessels Severe NPDR, no progression Severe NPDR, no progression   Periphery Good local PRP temporal window, Normal            IMAGING AND PROCEDURES  Imaging and Procedures for 11/03/21  Color Fundus Photography Optos - OU - Both Eyes       Right Eye Progression has improved. Disc findings include normal observations, increased cup to disc ratio. Macula : microaneurysms.   Left Eye Progression has improved. Disc findings include normal observations. Macula : microaneurysms.   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 Severe nonproliferative diabetic retinopathy of right eye without macular edema associated with type 2 diabetes mellitus (HCC)  [id number] Color Fundus Photography Optos - OU - Both Eyes    2. Severe nonproliferative diabetic retinopathy of left eye without macular edema associated with type 2 diabetes mellitus ([place])  [id number]       1.  Stable NPDR at the severe stage yet no signs of progression to PDR, no NVI seen no active maculopathy seen  2.  3.  Ophthalmic Meds Ordered this visit:  No orders of the defined types were placed in this encounter.      Return in about 6 months (around 05/03/2022) for DILATE OU, OCT, COLOR  FP.  There are no Patient Instructions on file for this visit.   Explained the diagnoses, plan, and follow up with the patient and they expressed understanding.  Patient expressed understanding of the importance of proper follow up care.   Clent Demark Carsin Randazzo M.D. Diseases & Surgery of the Retina and Vitreous Retina & Diabetic [place] 11/03/21     Abbreviations: M myopia (nearsighted); A astigmatism; H hyperopia (farsighted); P presbyopia; Mrx spectacle prescription;  CTL contact lenses; OD right eye; OS left eye; OU both eyes  XT exotropia; ET esotropia; PEK punctate epithelial keratitis; PEE punctate epithelial erosions; DES dry eye syndrome; MGD meibomian gland dysfunction; ATs artificial tears; PFAT's preservative free artificial tears; [place] nuclear sclerotic cataract; PSC posterior subcapsular cataract; ERM epi-retinal membrane; PVD posterior vitreous detachment; RD retinal detachment; DM diabetes mellitus; DR diabetic retinopathy; NPDR non-proliferative diabetic retinopathy; PDR proliferative diabetic retinopathy; CSME clinically significant macular edema; DME diabetic macular edema; dbh dot blot hemorrhages; CWS cotton wool spot; POAG primary open angle glaucoma; C/D cup-to-disc ratio; HVF humphrey visual field; GVF goldmann visual field; OCT optical coherence tomography; IOP intraocular pressure; BRVO Branch retinal vein occlusion; CRVO central retinal vein occlusion; CRAO central retinal artery occlusion; BRAO branch retinal artery occlusion; RT retinal tear; SB  scleral buckle; PPV pars plana vitrectomy; VH Vitreous hemorrhage; PRP panretinal laser photocoagulation; IVK intravitreal kenalog; VMT vitreomacular traction; MH Macular hole;  NVD neovascularization of the disc; NVE neovascularization elsewhere; AREDS age related eye disease study; ARMD age related macular degeneration; POAG primary open angle glaucoma; EBMD epithelial/anterior basement membrane dystrophy; ACIOL anterior chamber  intraocular lens; IOL intraocular lens; PCIOL posterior chamber intraocular lens; Phaco/IOL phacoemulsification with intraocular lens placement; PRK photorefractive keratectomy; LASIK laser assisted in situ keratomileusis; HTN hypertension; DM diabetes mellitus; COPD chronic obstructive pulmonary disease

## 2021-11-21 ENCOUNTER — Other Ambulatory Visit: Payer: Self-pay | Admitting: Rheumatology

## 2021-11-21 DIAGNOSIS — L4050 Arthropathic psoriasis, unspecified: Secondary | ICD-10-CM

## 2021-11-21 NOTE — Telephone Encounter (Signed)
Next Visit: 03/09/2022  Last Visit: 10/13/2021  Last Fill: 07/04/2021  IA:[id number] arthritis   Current Dose per office note 10/13/2021: Humira 40 mg sq injections every 14 days  Labs: 09/17/2021 Glucose 157, BUN 26, Albumin 3.4,   TB Gold: 07/06/2021 Neg    Okay to refill Humira?

## 2021-11-29 ENCOUNTER — Other Ambulatory Visit: Payer: Self-pay

## 2021-11-29 ENCOUNTER — Ambulatory Visit: Payer: Medicare Other | Admitting: Podiatry

## 2021-11-29 DIAGNOSIS — M79675 Pain in left toe(s): Secondary | ICD-10-CM

## 2021-11-29 DIAGNOSIS — M79674 Pain in right toe(s): Secondary | ICD-10-CM

## 2021-11-29 DIAGNOSIS — E119 Type 2 diabetes mellitus without complications: Secondary | ICD-10-CM

## 2021-11-29 DIAGNOSIS — E1151 Type 2 diabetes mellitus with diabetic peripheral angiopathy without gangrene: Secondary | ICD-10-CM

## 2021-11-29 DIAGNOSIS — L600 Ingrowing nail: Secondary | ICD-10-CM | POA: Diagnosis not present

## 2021-11-29 DIAGNOSIS — D6859 Other primary thrombophilia: Secondary | ICD-10-CM | POA: Insufficient documentation

## 2021-11-29 DIAGNOSIS — I69959 Hemiplegia and hemiparesis following unspecified cerebrovascular disease affecting unspecified side: Secondary | ICD-10-CM

## 2021-11-29 DIAGNOSIS — B351 Tinea unguium: Secondary | ICD-10-CM

## 2021-11-29 DIAGNOSIS — R040 Epistaxis: Secondary | ICD-10-CM | POA: Insufficient documentation

## 2021-12-06 ENCOUNTER — Encounter: Payer: Self-pay | Admitting: Podiatry

## 2021-12-06 NOTE — Progress Notes (Signed)
ANNUAL DIABETIC FOOT EXAM  Subjective: Margaret Hobbs presents today for for annual diabetic foot examination and painful elongated mycotic toenails 1-5 bilaterally which are tender when wearing enclosed shoe gear. Pain is relieved with periodic professional debridement..  Patient relates 15 year h/o diabetes.  Patient denies any h/o foot wounds.  Patient relates symptoms of foot numbness in arch area.  Patient is s/p CVA with RLE weakness.  Patient does not monitor blood glucose daily.  She did have matrixectomy of left great toe lateral border performed by Dr. Thurmond Butts, Netta Corrigan., MD is patient's PCP. Last visit was November, 2022.  Today, she relates tenderness of the right great toe medial border. Denies any redness, drainage or swelling. No fever, chills, night sweats, nausea or vomiting.  Past Medical History:  Diagnosis Date   Aphasia    Arthritis    CAD (coronary artery disease)    Cerebrovascular disease    CVA (cerebral vascular accident) (HCC)    Depressed    Diabetes mellitus    DM2 (diabetes mellitus, type 2) (HCC)    HLD (hyperlipidemia)    HTN (hypertension)    Hypercholesteremia    Hyperparathyroidism    MI (myocardial infarction) (HCC)    Osteoporosis    TIA (transient ischemic attack)    UTI (urinary tract infection)    Patient Active Problem List   Diagnosis Date Noted   Epistaxis 11/29/2021   Hypercoagulable state (HCC) 11/29/2021   Morbid obesity (HCC) 11/29/2021   Pseudophakia of both eyes 04/28/2021   Claudication in peripheral vascular disease (HCC) 03/31/2021   Chronic kidney disease due to hypertension 05/18/2020   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) 05/06/2020   Round hole of right eye 05/06/2020   Severe nonproliferative diabetic retinopathy of left eye (HCC) 05/06/2020   Retinal hemorrhage of left eye 05/06/2020   Peripheral arterial disease (HCC) 10/17/2019   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HCC)  09/17/2017   Carotid artery disease (HCC) 01/24/2017   Primary osteoarthritis of both knees 11/29/2016   Other psoriasis 11/28/2016   Psoriatic arthritis (HCC) 11/28/2016   High risk medication use 11/28/2016   Chronic kidney disease, stage 3a (HCC) 11/25/2012   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/29/2009   Carotid artery occlusion 10/19/2009   Age-related osteoporosis without current pathological fracture 10/19/2009   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   Type 2 diabetes mellitus (HCC) 03/30/2009   Hyperlipidemia 03/30/2009   Dyslipidemia 03/30/2009   Essential hypertension 03/30/2009   History of MI (myocardial infarction) 03/30/2009   Coronary artery disease involving native coronary artery of native heart without angina pectoris 03/30/2009   Cerebral artery occlusion with cerebral infarction (HCC) 03/30/2009   URINARY TRACT INFECTION 03/30/2009   ARTHRITIS 03/30/2009   HYPERPARATHYROIDISM, HX OF 03/30/2009   Past Surgical History:  Procedure Laterality Date   ABDOMINAL AORTOGRAM W/LOWER EXTREMITY Bilateral 03/31/2021   Procedure: ABDOMINAL AORTOGRAM W/LOWER EXTREMITY;  Surgeon: Runell Gess, MD;  Location: [facility];  Service: Cardiovascular;  Laterality: Bilateral;   ABDOMINAL HYSTERECTOMY     CATARACT EXTRACTION W/PHACO Bilateral 2017   Dr. Alben Spittle   hysterectomy - unknown type     Current Outpatient Medications on File Prior to Visit  Medication Sig Dispense Refill   Coenzyme Q10 (CO Q-10) 30 MG CAPS 1 tab po qod     folic acid (FOLVITE) 1 MG tablet Take 1 tablet by mouth daily.  nitroGLYCERIN (NITROSTAT) 0.4 MG SL tablet as needed.     Adalimumab (HUMIRA) 40 MG/0.8ML PSKT Once every 2 weeks     amoxicillin (AMOXIL) 500 MG capsule Take 2,000 mg by mouth as directed. Take 4 capsules (2000 mg) by mouth 1 hour prior to dental procedures  0   Ascorbic Acid (VITAMIN C) 500 MG  tablet Take 500 mg by mouth 3 (three) times a week.     cholecalciferol (VITAMIN D) 1000 UNITS tablet Take 1,000 Units by mouth in the morning.     clopidogrel (PLAVIX) 75 MG tablet TAKE 1 TABLET BY MOUTH  DAILY 90 tablet 3   clopidogrel (PLAVIX) 75 MG tablet 1 tablet     Coenzyme Q10 100 MG TABS Take 100 mg by mouth in the morning.     ELIQUIS 5 MG TABS tablet TAKE 1 TABLET BY MOUTH TWICE A DAY 180 tablet 1   ezetimibe (ZETIA) 10 MG tablet Take 1 tablet by mouth daily.     glipiZIDE-metformin (METAGLIP) 5-500 MG tablet Take 1 tablet by mouth 2 (two) times daily.     hydrALAZINE (APRESOLINE) 50 MG tablet TAKE 1 TABLET BY MOUTH IN  THE MORNING AND AT BEDTIME 180 tablet 3   hydrALAZINE (APRESOLINE) 50 MG tablet Take 50 mg by mouth once. 1 Tablet at Bedtime (Patient not taking: Reported on 10/13/2021)     metoprolol succinate (TOPROL XL) 50 MG 24 hr tablet Take 1 tablet by mouth daily.     neomycin-polymyxin-hydrocortisone (CORTISPORIN) 3.5-10000-1 OTIC suspension Apply 1-2 drops daily after soaking and cover with bandaid (Patient not taking: No sig reported) 10 mL 0   nitroGLYCERIN (NITROSTAT) 0.4 MG SL tablet PLACE 1 TABLET UNDER THE TONGUE EVERY 5 (FIVE) MINUTES AS NEEDED FOR CHEST PAIN (3 DOSES MAX) (Patient not taking: Reported on 10/13/2021) 25 tablet 2   olmesartan-hydrochlorothiazide (BENICAR HCT) 40-25 MG tablet Take 1 tablet by mouth daily.     potassium chloride SA (KLOR-CON M20) 20 MEQ tablet 1 tablet with food     simvastatin (ZOCOR) 40 MG tablet Take 1 tablet (40 mg total) by mouth at bedtime. 90 tablet 3   simvastatin (ZOCOR) 40 MG tablet 1 tablet in the evening     VITAMIN A PO Take 3,000 Units by mouth daily.     vitamin E 400 UNIT capsule Take 400 Units by mouth in the morning.     Current Facility-Administered Medications on File Prior to Visit  Medication Dose Route Frequency Provider Last Rate Last Admin   sodium chloride flush (NS) 0.9 % injection 3 mL  3 mL Intravenous Q12H  Lorretta Harp, MD        Allergies  Allergen Reactions   Remicade [Infliximab] Other (See Comments)    Convulsion and felt cold    Amiodarone     Per pr report she was dizzy, weak and lethargic   Sulfa Antibiotics Rash   Social History   Occupational History   Not on file  Tobacco Use   Smoking status: Never   Smokeless tobacco: Never  Vaping Use   Vaping Use: Never used  Substance and Sexual Activity   Alcohol use: No   Drug use: Never   Sexual activity: Not on file   Family History  Problem Relation Age of Onset   Heart disease Mother        pacemaker   Hypertension Mother    Hyperlipidemia Mother    Diabetes Brother  pre-diabetic    Hypertension Son    Sleep apnea Son    Diabetes Other    Hypertension Other    Alcohol abuse Other    Coronary artery disease Other    Immunization History  Administered Date(s) Administered   PFIZER(Purple Top)SARS-COV-2 Vaccination 02/16/2020, 03/08/2020, 11/05/2020     Review of Systems: Negative except as noted in the HPI.   Objective: There were no vitals filed for this visit.  Margaret Hobbs is a pleasant 70 y.o. female in NAD. AAO X 3.  Vascular Examination: CFT immediate b/l LE. Palpable DP/PT pulses b/l LE. Digital hair sparse b/l. Skin temperature gradient WNL b/l. No pain with calf compression b/l. No edema noted b/l. No cyanosis or clubbing noted b/l LE.  Dermatological Examination: Pedal integument with normal turgor, texture and tone b/l LE. No open wounds b/l. No interdigital macerations b/l. Toenails 2-5 bilaterally and L hallux elongated, thickened, discolored with subungual debris. +Tenderness with dorsal palpation of nailplates. No hyperkeratotic or porokeratotic lesions present. Incurvated nailplate medial border(s) R hallux.  Nail border hypertrophy minimal. There is tenderness to palpation. Sign(s) of infection: no clinical signs of infection noted on examination today.. Procedure site of  lateral border left hallux noted to be completely healed with no erythema, no edema, no drainage, no purulence.   Musculoskeletal Examination: Muscle strength 5/5 to all LE muscle groups of left lower extremity. Muscle strength 3/5 to all LE muscle groups of right lower extremity.  Footwear Assessment: Does the patient wear appropriate shoes? Yes. Does the patient need inserts/orthotics? No.  Neurological Examination: Protective sensation intact 5/5 intact bilaterally with 10g monofilament b/l. Vibratory sensation intact b/l.  Hemoglobin A1C Latest Ref Rng & Units 04/01/2021  HGBA1C 4.8 - 5.6 % 7.2(H)  Some recent data might be hidden   Assessment: 1. Pain due to onychomycosis of toenails of both feet   2. Ingrown toenail without infection   3. 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 Type II diabetes mellitus with peripheral circulatory disorder (HCC)   5. Encounter for diabetic foot exam ([place])      ADA Risk Categorization: Low Risk :  Patient has all of the following: Intact protective sensation No prior foot ulcer  No severe deformity Pedal pulses present  Plan: -Examined patient. -Diabetic foot examination performed today. -Continue foot and shoe inspections daily. Monitor blood glucose per PCP/Endocrinologist's recommendations. -Mycotic toenails 1-5 bilaterally were debrided in length and girth with sterile nail nippers and dremel without iatrogenic bleeding. -Offending nail border debrided and curretaged R hallux utilizing sterile nail nipper and currette. Border(s) cleansed with alcohol and triple antibiotic ointment applied. Patient instructed to apply Neosporin Cream  to R hallux once daily for 7 days. -Patient/POA to call should there be question/concern in the interim.  Return in about 3 months (around 02/27/2022).  Marzetta Board, DPM

## 2022-01-26 ENCOUNTER — Other Ambulatory Visit: Payer: Self-pay | Admitting: Physician Assistant

## 2022-01-26 NOTE — Telephone Encounter (Signed)
Next Visit: 03/09/2022   Last Visit: 10/13/2021   Last Fill: 07/04/2021   [id number] arthritis    Current Dose per office note 10/13/2021: Humira 40 mg sq injections every 14 days   Labs: 09/17/2021 Glucose 157, BUN 26, Albumin 3.4,    TB Gold: 07/06/2021 Neg    Patient states she updated with PCP and is calling to have them sent to our office   Okay to refill Humira?

## 2022-01-29 NOTE — Progress Notes (Signed)
She CARDIOLOGY CONSULT NOTE       Patient ID: Margaret Hobbs MRN: [id number] DOB/AGE: 1951-07-31 71 y.o.  Primary Physician: Ginger Organ., MD Primary Cardiologist: Johnsie Cancel Vascular MD  Gwenlyn Found      HPI:  71 y.o. with history of CAD inferior MI 2008 Rx BMS to RCA, HTN, HLD TIA, psoriatic arthritis PAF And PVD. Angiogram 04/01/21 aborted due to PAF calcified CTO distal left SFA with one vessel run off and 70% right SFA.  Converted to NSR with amiodarone Started on eliquis and plavix only for CAD. Amiodarone  D/c  04/15/21 due to dizziness. She is intolerant to lipitor but takes crestor   TTE 04/14/21 EF 60-65% trivial MR AV sclerosis LA 3.6 cm   She felt horrible right away with amiodarone Still with LE claudication using cane Deferred f/u with Dr Gwenlyn Found Most recent carotid 09/29/21 with plaque no stenosis   Seen by EP Dr Quentin Ore for PAF 08/10/21 Monitor that month with <1% PAF burden Discussed Tikosyn but did not think AAT needed at that time   No chest pain but less active myovue 07/28/21 normal no ischemia EF 62%   ED visit 09/17/21 for elevated BP and epistaxis no recurrence   She reiterates being frustrated by inconsistent PV care with Dr Gwenlyn Found She is in afib today and does not notice it.  After 15 minutes she converted in office documented with ECG She has an ingrown toe nail on right foot and needs to see podiatry   ROS All other systems reviewed and negative except as noted above  Past Medical History:  Diagnosis Date   Aphasia    Arthritis    CAD (coronary artery disease)    Cerebrovascular disease    CVA (cerebral vascular accident) ([place])    Depressed    Diabetes mellitus    DM2 (diabetes mellitus, type 2) ([place])    HLD (hyperlipidemia)    HTN (hypertension)    Hypercholesteremia    Hyperparathyroidism    MI (myocardial infarction) ([place])    Osteoporosis    TIA (transient ischemic attack)    UTI (urinary tract infection)     Family History  Problem  Relation Age of Onset   Heart disease Mother        pacemaker   Hypertension Mother    Hyperlipidemia Mother    Diabetes Brother        pre-diabetic    Hypertension Son    Sleep apnea Son    Diabetes Other    Hypertension Other    Alcohol abuse Other    Coronary artery disease Other     Social History   Socioeconomic History   Marital status: Widowed    Spouse name: Not on file   Number of children: 1   Years of education: Not on file   Highest education level: Not on file  Occupational History   Not on file  Tobacco Use   Smoking status: Never   Smokeless tobacco: Never  Vaping Use   Vaping Use: Never used  Substance and Sexual Activity   Alcohol use: No   Drug use: Never   Sexual activity: Not on file  Other Topics Concern   Not on file  Social History Narrative   Not on file   Social Determinants of Health   Financial Resource Strain: Not on file  Food Insecurity: Not on file  Transportation Needs: Not on file  Physical Activity: Not on file  Stress: Not on file  Social Connections: Not on file  Intimate Partner Violence: Not on file    Past Surgical History:  Procedure Laterality Date   ABDOMINAL AORTOGRAM W/LOWER EXTREMITY Bilateral 03/31/2021   Procedure: ABDOMINAL AORTOGRAM W/LOWER EXTREMITY;  Surgeon: Lorretta Harp, MD;  Location: [facility];  Service: Cardiovascular;  Laterality: Bilateral;   ABDOMINAL HYSTERECTOMY     CATARACT EXTRACTION W/PHACO Bilateral 2017   Dr. Kathlen Mody   hysterectomy - unknown type        Current Outpatient Medications:    amoxicillin (AMOXIL) 500 MG capsule, Take 2,000 mg by mouth as directed. Take 4 capsules (2000 mg) by mouth 1 hour prior to dental procedures, Disp: , Rfl: 0   Ascorbic Acid (VITAMIN C) 500 MG tablet, Take 500 mg by mouth 3 (three) times a week., Disp: , Rfl:    cholecalciferol (VITAMIN D) 1000 UNITS tablet, Take 1,000 Units by mouth in the morning., Disp: , Rfl:    clopidogrel (PLAVIX) 75 MG  tablet, TAKE 1 TABLET BY MOUTH  DAILY, Disp: 90 tablet, Rfl: 3   Coenzyme Q10 100 MG TABS, Take 100 mg by mouth in the morning., Disp: , Rfl:    ELIQUIS 5 MG TABS tablet, TAKE 1 TABLET BY MOUTH TWICE A DAY, Disp: 180 tablet, Rfl: 1   ezetimibe (ZETIA) 10 MG tablet, Take 1 tablet by mouth daily., Disp: , Rfl:    folic acid (FOLVITE) 1 MG tablet, Take 1 tablet by mouth daily., Disp: , Rfl:    glipiZIDE-metformin (METAGLIP) 5-500 MG tablet, Take 1 tablet by mouth 2 (two) times daily., Disp: , Rfl:    HUMIRA 40 MG/0.4ML PSKT, INJECT 40MG  SUBCUTANEOUSLY EVERY OTHER WEEK, Disp: 2 each, Rfl: 2   hydrALAZINE (APRESOLINE) 50 MG tablet, TAKE 1 TABLET BY MOUTH IN  THE MORNING AND AT BEDTIME, Disp: 180 tablet, Rfl: 3   metoprolol succinate (TOPROL-XL) 50 MG 24 hr tablet, Take 1 tablet by mouth daily., Disp: , Rfl:    neomycin-polymyxin-hydrocortisone (CORTISPORIN) 3.5-10000-1 OTIC suspension, Apply 1-2 drops daily after soaking and cover with bandaid, Disp: 10 mL, Rfl: 0   nitroGLYCERIN (NITROSTAT) 0.4 MG SL tablet, PLACE 1 TABLET UNDER THE TONGUE EVERY 5 (FIVE) MINUTES AS NEEDED FOR CHEST PAIN (3 DOSES MAX), Disp: 25 tablet, Rfl: 2   olmesartan-hydrochlorothiazide (BENICAR HCT) 40-25 MG tablet, Take 1 tablet by mouth daily., Disp: , Rfl:    potassium chloride SA (KLOR-CON M) 20 MEQ tablet, 1 tablet with food, Disp: , Rfl:    simvastatin (ZOCOR) 40 MG tablet, Take 1 tablet (40 mg total) by mouth at bedtime., Disp: 90 tablet, Rfl: 3   VITAMIN A PO, Take 3,000 Units by mouth daily., Disp: , Rfl:    vitamin E 400 UNIT capsule, Take 400 Units by mouth in the morning., Disp: , Rfl:   Current Facility-Administered Medications:    sodium chloride flush (NS) 0.9 % injection 3 mL, 3 mL, Intravenous, Q12H, Lorretta Harp, MD  sodium chloride flush  3 mL Intravenous Q12H     Physical Exam: Blood pressure 136/84, pulse 63, height 5\' 2"  (1.575 m), weight 207 lb (93.9 kg), SpO2 97 %.    Affect  appropriate Overweight female  HEENT: normal Neck supple with no adenopathy JVP normal no bruits no thyromegaly Lungs clear with no wheezing and good diaphragmatic motion Heart:  S1/S2 no murmur, no rub, gallop or click PMI normal Abdomen: benighn, BS positve, no tenderness, no AAA no bruit.  No HSM or HJR Decreased pulses  bilaterally  No edema Neuro non-focal Skin warm and dry No muscular weakness   Labs:   Lab Results  Component Value Date   WBC 8.1 09/17/2021   HGB 13.0 09/17/2021   HCT 39.9 09/17/2021   MCV 93.0 09/17/2021   PLT 260 09/17/2021   No results for input(s): NA, K, CL, CO2, BUN, CREATININE, CALCIUM, PROT, BILITOT, ALKPHOS, ALT, AST, GLUCOSE in the last 168 hours.  Invalid input(s): LABALBU Lab Results  Component Value Date   CKTOTAL 28 06/03/2007   CKMB 0.7 06/03/2007   TROPONINI 0.02        NO INDICATION OF MYOCARDIAL INJURY. 06/03/2007    Lab Results  Component Value Date   CHOL 138 08/24/2016   CHOL  06/03/2007    116        ATP III CLASSIFICATION:  <200     mg/dL   Desirable  200-239  mg/dL   Borderline High  >=240    mg/dL   High   Lab Results  Component Value Date   HDL 40 (L) 08/24/2016   HDL 40 06/03/2007   Lab Results  Component Value Date   LDLCALC 59 08/24/2016   [place]  06/03/2007    57        Total Cholesterol/HDL:CHD Risk Coronary Heart Disease Risk Table                     Men   Women  1/2 Average Risk   3.4   3.3   Lab Results  Component Value Date   TRIG 197 (H) 08/24/2016   TRIG 94 06/03/2007   Lab Results  Component Value Date   CHOLHDL 3.5 08/24/2016   CHOLHDL 2.9 06/03/2007   No results found for: LDLDIRECT    Radiology: No results found.  EKG: SR rate 72 poor R wave progression 04/15/21    ASSESSMENT AND PLAN:   CAD:  2008 BMS to RCA f/u Non ischemic myovue August 2022 continue medical Rx  PAF:  03/2021 in setting of PV angiogram NSR amiodarone d/c due to dizziness continue Eliquis Seen by EP low  PAF burden no AAT at this time Consider Tikosyn if changes She is in afib today confirmed by ECG repeat monitor and refer back to EP to suggest Multaq vs hospitalization for Tikosyn  PVD:  Bilateral SFA dx with CTA left SFA Single vessel run off bilaterally f/u LE ABI and arterial duplex Refer to Dr Donzetta Matters / [facility] as she did not want to see Dr Gwenlyn Found HLD:  continue crestor  DM:  Discussed low carb diet.  Target hemoglobin A1c is 6.5 or less.  Continue current medications. A1c 7.2   ABI LE arterial duplex Refer to [facility] 30 day monitor  Refer back to EP  F/U cardiology in a year     Signed: Jenkins Rouge 02/06/2022, 8:35 AM

## 2022-02-06 ENCOUNTER — Ambulatory Visit (INDEPENDENT_AMBULATORY_CARE_PROVIDER_SITE_OTHER): Payer: Medicare Other

## 2022-02-06 ENCOUNTER — Other Ambulatory Visit: Payer: Self-pay

## 2022-02-06 ENCOUNTER — Ambulatory Visit: Payer: Medicare Other | Admitting: Cardiovascular Disease

## 2022-02-06 ENCOUNTER — Encounter: Payer: Self-pay | Admitting: Cardiovascular Disease

## 2022-02-06 VITALS — BP 136/84 | HR 63 | Ht 62.0 in | Wt 207.0 lb

## 2022-02-06 DIAGNOSIS — I10 Essential (primary) hypertension: Secondary | ICD-10-CM

## 2022-02-06 DIAGNOSIS — I739 Peripheral vascular disease, unspecified: Secondary | ICD-10-CM

## 2022-02-06 DIAGNOSIS — I480 Paroxysmal atrial fibrillation: Secondary | ICD-10-CM

## 2022-02-06 DIAGNOSIS — I2510 Atherosclerotic heart disease of native coronary artery without angina pectoris: Secondary | ICD-10-CM

## 2022-02-06 DIAGNOSIS — E785 Hyperlipidemia, unspecified: Secondary | ICD-10-CM | POA: Diagnosis not present

## 2022-02-06 NOTE — Progress Notes (Unsigned)
[id number] from office inventory applied to patient.

## 2022-02-06 NOTE — Patient Instructions (Addendum)
Medication Instructions:  Your physician recommends that you continue on your current medications as directed. Please refer to the Current Medication list given to you today.  *If you need a refill on your cardiac medications before your next appointment, please call your pharmacy*  Lab Work: If you have labs (blood work) drawn today and your tests are completely normal, you will receive your results only by: [place] (if you have MyChart) OR A paper copy in the mail If you have any lab test that is abnormal or we need to change your treatment, we will call you to review the results.  Testing/Procedures: Your physician has requested that you have a lower extremity arterial duplex with ABI's. During this test an ultrasound is used to evaluate arterial blood flow in the legs. Allow one hour for this exam. There are no restrictions or special instructions.  Your physician has recommended that you wear a 30 day event monitor. Event monitors are medical devices that record the hearts electrical activity. Doctors most often [place] these monitors to diagnose arrhythmias. Arrhythmias are problems with the speed or rhythm of the heartbeat. The monitor is a small, portable device. You can wear one while you do your normal daily activities. This is usually used to diagnose what is causing palpitations/syncope (passing out).  Follow-Up: At [facility], you and your health needs are our priority.  As part of our continuing mission to provide you with exceptional heart care, we have created designated Provider Care Teams.  These Care Teams include your primary Cardiologist (physician) and Advanced Practice Providers (APPs -  Physician Assistants and Nurse Practitioners) who all work together to provide you with the care you need, when you need it.  We recommend signing up for the patient portal called "MyChart".  Sign up information is provided on this After Visit Summary.  MyChart is used to connect  with patients for Virtual Visits (Telemedicine).  Patients are able to view lab/test results, encounter notes, upcoming appointments, etc.  Non-urgent messages can be sent to your provider as well.   To learn more about what you can do with MyChart, go to [email or web address].    Your next appointment:   6 months  The format for your next appointment:   In Person  Provider:   Jenkins Rouge, MD {  Other Instructions Your physician recommends that you schedule a follow-up appointment with Dr. Quentin Ore or [facility]  You have been referred to [facility] for PVD

## 2022-02-09 ENCOUNTER — Other Ambulatory Visit: Payer: Self-pay | Admitting: Cardiovascular Disease

## 2022-02-09 DIAGNOSIS — I10 Essential (primary) hypertension: Secondary | ICD-10-CM

## 2022-02-14 ENCOUNTER — Other Ambulatory Visit: Payer: Self-pay

## 2022-02-14 ENCOUNTER — Ambulatory Visit: Payer: Medicare Other | Admitting: Podiatry

## 2022-02-14 DIAGNOSIS — L600 Ingrowing nail: Secondary | ICD-10-CM

## 2022-02-14 MED ORDER — NEOMYCIN-POLYMYXIN-HC 3.5-10000-1 OT SUSP: OTIC | 0 refills | Status: DC

## 2022-02-14 MED ORDER — CEPHALEXIN 500 MG PO CAPS: 500.0000 mg | ORAL_CAPSULE | Freq: Three times a day (TID) | ORAL | 0 refills | Status: AC

## 2022-02-14 NOTE — Patient Instructions (Signed)

## 2022-02-16 ENCOUNTER — Telehealth: Payer: Self-pay

## 2022-02-16 NOTE — Telephone Encounter (Signed)
Called pt in regards to critical monitor report from 02/15/22 at 4:31 PM CST.  Left a message to call back.      Cardiac Monitor Alert  Date of alert:  02/16/2022   Patient Name: Margaret Hobbs  DOB: Jan 21, 1951  MRN: [id number]   [facility] Cardiologist: Jenkins Rouge, MD  [facility] EP:  Vickie Epley, MD    Monitor Information: Cardiac Event Monitor [[employer]]  Reason:  PAF, assess burden Ordering provider:  Jenkins Rouge, MD   Alert Atrial Fibrillation/Flutter This is the 1st alert for this rhythm.  The patient has a hx of Paroxysmal Atrial Fibrillation/Flutter.  The patient is currently on anticoagulation. Anticoagulation medication as of 02/16/2022           ELIQUIS 5 MG TABS tablet TAKE 1 TABLET BY MOUTH TWICE A DAY       Next Cardiology Appointment   Date:  08/17/22  Provider:  Jenkins Rouge, MD  The patient could NOT be reached by telephone today.    DOD Dr. Marlou Porch reviewed tracing.  No new orders        Precious Gilding, RN  02/16/2022 10:17 AM

## 2022-02-17 NOTE — Telephone Encounter (Signed)
Patient has appt scheduled March 22 with Dr. Lalla Brothers post heart monitor.

## 2022-02-20 NOTE — Progress Notes (Signed)
°  Subjective:  Patient ID: Margaret Hobbs, female    DOB: 03/17/1951,  MRN: [id number]  Chief Complaint  Patient presents with   Ingrown Toenail    R ingrown nail on great toe     71 y.o. female returns for follow-up with the above complaint. History confirmed with patient.  Left side is doing fine.  Now having the same issue on the outside of the right great toe  Objective:  Physical Exam: warm, good capillary refill, no trophic changes or ulcerative lesions, normal DP and PT pulses and normal sensory exam. Right hallux lateral border ingrown  Assessment:   1. Ingrowing right great toenail       Plan:  Patient was evaluated and treated and all questions answered.     Ingrown Nail, right -Patient elects to proceed with minor surgery to remove ingrown toenail today. Consent reviewed and signed by patient. -Ingrown nail excised. See procedure note. -Educated on post-procedure care including soaking. Written instructions provided and reviewed. -Rx for 5 days Keflex and Cortisporin drops sent to pharmacy  Procedure: Excision of Ingrown Toenail Location: Right 1st toe lateral nail borders. Anesthesia: Lidocaine 1% plain; 1.5 mL and Marcaine 0.5% plain; 1.5 mL, digital block. Skin Prep: Betadine. Dressing: Silvadene; telfa; dry, sterile, compression dressing. Technique: Following skin prep, the toe was exsanguinated and a tourniquet was secured at the base of the toe. The affected nail border was freed, split with a nail splitter, and excised. Chemical matrixectomy was then performed with phenol and irrigated out with alcohol. The tourniquet was then removed and sterile dressing applied. Disposition: Patient tolerated procedure well.   No follow-ups on file.

## 2022-02-23 NOTE — Progress Notes (Unsigned)
Office Visit Note  Patient: Margaret Hobbs             Date of Birth: 1951-08-16           MRN: [id number]             PCP: Ginger Organ., MD Referring: Ginger Organ., MD Visit Date: 03/09/2022 Occupation: @GUAROCC @  Subjective:  No chief complaint on file.   History of Present Illness: Margaret Hobbs is a 71 y.o. female ***   Activities of Daily Living:  Patient reports morning stiffness for *** {minute/hour:19697}.   Patient {ACTIONS;DENIES/REPORTS:21021675::"Denies"} nocturnal pain.  Difficulty dressing/grooming: {ACTIONS;DENIES/REPORTS:21021675::"Denies"} Difficulty climbing stairs: {ACTIONS;DENIES/REPORTS:21021675::"Denies"} Difficulty getting out of chair: {ACTIONS;DENIES/REPORTS:21021675::"Denies"} Difficulty using hands for taps, buttons, cutlery, and/or writing: {ACTIONS;DENIES/REPORTS:21021675::"Denies"}  No Rheumatology ROS completed.   PMFS History:  Patient Active Problem List   Diagnosis Date Noted   Epistaxis 11/29/2021   Hypercoagulable state ([place]) 11/29/2021   Morbid obesity ([place]) 11/29/2021   Pseudophakia of both eyes 04/28/2021   Claudication in peripheral vascular disease ([place]) 03/31/2021   Chronic kidney disease due to hypertension 05/18/2020   Severe nonproliferative diabetic retinopathy of right eye ([place]) 05/06/2020   Round hole of right eye 05/06/2020   Severe nonproliferative diabetic retinopathy of left eye ([place]) 05/06/2020   Retinal hemorrhage of left eye 05/06/2020   Peripheral arterial disease ([place]) 10/17/2019   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[place]) 09/17/2017   Carotid artery disease ([place]) 01/24/2017   Primary osteoarthritis of both knees 11/29/2016   Other psoriasis 11/28/2016   Psoriatic arthritis ([place]) 11/28/2016   High risk medication use 11/28/2016   Chronic kidney disease, stage 3a ([place]) 11/25/2012   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[place])  10/29/2009   Carotid artery occlusion 10/19/2009   Age-related osteoporosis without current pathological fracture 10/19/2009   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[place]) 10/19/2009   Type 2 diabetes mellitus ([place]) 03/30/2009   Hyperlipidemia 03/30/2009   Dyslipidemia 03/30/2009   Essential hypertension 03/30/2009   History of MI (myocardial infarction) 03/30/2009   Coronary artery disease involving native coronary artery of native heart without angina pectoris 03/30/2009   Cerebral artery occlusion with cerebral infarction ([place]) 03/30/2009   URINARY TRACT INFECTION 03/30/2009   ARTHRITIS 03/30/2009   HYPERPARATHYROIDISM, HX OF 03/30/2009    Past Medical History:  Diagnosis Date   Aphasia    Arthritis    CAD (coronary artery disease)    Cerebrovascular disease    CVA (cerebral vascular accident) ([place])    Depressed    Diabetes mellitus    DM2 (diabetes mellitus, type 2) ([place])    HLD (hyperlipidemia)    HTN (hypertension)    Hypercholesteremia    Hyperparathyroidism    MI (myocardial infarction) ([place])    Osteoporosis    TIA (transient ischemic attack)    UTI (urinary tract infection)     Family History  Problem Relation Age of Onset   Heart disease Mother        pacemaker   Hypertension Mother    Hyperlipidemia Mother    Diabetes Brother        pre-diabetic    Hypertension Son    Sleep apnea Son    Diabetes Other    Hypertension Other    Alcohol abuse Other    Coronary artery disease Other    Past Surgical History:  Procedure Laterality Date   ABDOMINAL AORTOGRAM W/LOWER EXTREMITY Bilateral 03/31/2021   Procedure: ABDOMINAL AORTOGRAM W/LOWER EXTREMITY;  Surgeon: Lorretta Harp, MD;  Location: [facility];  Service: Cardiovascular;  Laterality: Bilateral;   ABDOMINAL HYSTERECTOMY     CATARACT EXTRACTION W/PHACO Bilateral 2017   Dr. Kathlen Mody   hysterectomy - unknown type     Social History   Social History Narrative   Not on file   Immunization History  Administered  Date(s) Administered   PFIZER(Purple Top)SARS-COV-2 Vaccination 02/16/2020, 03/08/2020, 11/05/2020     Objective: Vital Signs: There were no vitals taken for this visit.   Physical Exam   Musculoskeletal Exam: ***  CDAI Exam: CDAI Score: -- Patient Global: --; Provider Global: -- Swollen: --; Tender: -- Joint Exam 03/09/2022   No joint exam has been documented for this visit   There is currently no information documented on the homunculus. Go to the Rheumatology activity and complete the homunculus joint exam.  Investigation: No additional findings.  Imaging: No results found.  Recent Labs: Lab Results  Component Value Date   WBC 8.1 09/17/2021   HGB 13.0 09/17/2021   PLT 260 09/17/2021   NA 139 09/17/2021   K 4.4 09/17/2021   CL 103 09/17/2021   CO2 28 09/17/2021   GLUCOSE 157 (H) 09/17/2021   BUN 26 (H) 09/17/2021   CREATININE 0.99 09/17/2021   BILITOT 0.6 09/17/2021   ALKPHOS 37 (L) 09/17/2021   AST 24 09/17/2021   ALT 26 09/17/2021   PROT 6.7 09/17/2021   ALBUMIN 3.4 (L) 09/17/2021   CALCIUM 9.3 09/17/2021   GFRAA 69 07/01/2019   QFTBGOLDPLUS NEGATIVE 07/06/2021    Speciality Comments: No specialty comments available.  Procedures:  No procedures performed Allergies: Remicade [infliximab], Amiodarone, and Sulfa antibiotics   Assessment / Plan:     Visit Diagnoses: Psoriatic arthritis ([place])  Other psoriasis  High risk medication use  Primary osteoarthritis of both knees  Age-related osteoporosis without current pathological fracture  History of atrial fibrillation  History of stroke  History of diabetes mellitus  Dyslipidemia  History of hypercholesterolemia  History of coronary artery disease  History of hypertension  Cerebral artery occlusion with cerebral infarction ([place])  Orders: No orders of the defined types were placed in this encounter.  No orders of the defined types were placed in this encounter.   Face-to-face time  spent with patient was *** minutes. Greater than 50% of time was spent in counseling and coordination of care.  Follow-Up Instructions: No follow-ups on file.   Ofilia Neas, PA-C  Note - This record has been created using Dragon software.  Chart creation errors have been sought, but may not always  have been located. Such creation errors do not reflect on  the standard of medical care.

## 2022-03-02 ENCOUNTER — Other Ambulatory Visit: Payer: Self-pay | Admitting: Cardiovascular Disease

## 2022-03-02 DIAGNOSIS — I739 Peripheral vascular disease, unspecified: Secondary | ICD-10-CM

## 2022-03-06 ENCOUNTER — Other Ambulatory Visit: Payer: Self-pay

## 2022-03-06 ENCOUNTER — Ambulatory Visit (HOSPITAL_COMMUNITY)
Admission: RE | Admit: 2022-03-06 | Discharge: 2022-03-06 | Disposition: A | Discharge status: Home / Self Care | Payer: Medicare Other | Source: Ambulatory Visit | Attending: Cardiovascular Disease | Admitting: Cardiovascular Disease

## 2022-03-06 DIAGNOSIS — I739 Peripheral vascular disease, unspecified: Secondary | ICD-10-CM | POA: Diagnosis present

## 2022-03-08 ENCOUNTER — Encounter: Payer: Self-pay | Admitting: Vascular Surgery

## 2022-03-08 ENCOUNTER — Ambulatory Visit: Payer: Medicare Other | Admitting: Vascular Surgery

## 2022-03-08 ENCOUNTER — Other Ambulatory Visit: Payer: Self-pay

## 2022-03-08 VITALS — BP 149/103 | HR 57 | Temp 98.2°F | Resp 20 | Ht 62.0 in | Wt 208.0 lb

## 2022-03-08 DIAGNOSIS — I70212 Atherosclerosis of native arteries of extremities with intermittent claudication, left leg: Secondary | ICD-10-CM

## 2022-03-08 NOTE — Progress Notes (Signed)
? ?Patient ID: Margaret Hobbs, female   DOB: 05-28-1951, 71 y.o.   MRN: [id number] ? ?Reason for Consult: New Patient (Initial Visit) ?  ?Referred by Ginger Organ., MD ? ?Subjective:  ?   ?HPI: ? ?Margaret Hobbs is a 71 y.o. female without previous vascular intervention.  She does have a history of stroke, coronary artery disease, atrial fibrillation currently on Eliquis, diabetes, hypertension and hyperlipidemia.  She did undergo angiography about 1 year ago that was complicated by atrial fibrillation and the procedure was stopped.  She does continue to have short distance claudication mostly when she is in the grocery store.  She states that she mostly lives a sedentary lifestyle.  She does not have tissue loss or ulceration.  She recently had a small procedure on her right great toe which is healing well.  No other recent issues.  Continues on Eliquis and Plavix. ? ?Past Medical History:  ?Diagnosis Date  ? Aphasia   ? Arthritis   ? CAD (coronary artery disease)   ? Cerebrovascular disease   ? CVA [facility] vascular accident[facility])   ? Depressed   ? Diabetes mellitus   ? DM2 (diabetes mellitus, type 2) ([place])   ? HLD (hyperlipidemia)   ? HTN (hypertension)   ? Hypercholesteremia   ? Hyperparathyroidism   ? MI (myocardial infarction) ([place])   ? Osteoporosis   ? TIA (transient ischemic attack)   ? UTI (urinary tract infection)   ? ?Family History  ?Problem Relation Age of Onset  ? Heart disease Mother   ?     pacemaker  ? Hypertension Mother   ? Hyperlipidemia Mother   ? Diabetes Brother   ?     pre-diabetic   ? Hypertension Son   ? Sleep apnea Son   ? Diabetes Other   ? Hypertension Other   ? Alcohol abuse Other   ? Coronary artery disease Other   ? ?Past Surgical History:  ?Procedure Laterality Date  ? ABDOMINAL AORTOGRAM W/LOWER EXTREMITY Bilateral 03/31/2021  ? Procedure: ABDOMINAL AORTOGRAM W/LOWER EXTREMITY;  Surgeon: Lorretta Harp, MD;  Location: [facility];  Service: Cardiovascular;   Laterality: Bilateral;  ? ABDOMINAL HYSTERECTOMY    ? CATARACT EXTRACTION W/PHACO Bilateral 2017  ? Dr. Kathlen Mody  ? hysterectomy - unknown type    ? ? ?Short Social History:  ?Social History  ? ?Tobacco Use  ? Smoking status: Never  ? Smokeless tobacco: Never  ?Substance Use Topics  ? Alcohol use: No  ? ? ?Allergies  ?Allergen Reactions  ? Remicade [Infliximab] Other (See Comments)  ?  Convulsion and felt cold   ? Amiodarone   ?  Per pr report she was dizzy, weak and lethargic  ? Sulfa Antibiotics Rash  ? ? ?Current Outpatient Medications  ?Medication Sig Dispense Refill  ? amoxicillin (AMOXIL) 500 MG capsule Take 2,000 mg by mouth as directed. Take 4 capsules (2000 mg) by mouth 1 hour prior to dental procedures  0  ? Ascorbic Acid (VITAMIN C) 500 MG tablet Take 500 mg by mouth 3 (three) times a week.    ? cholecalciferol (VITAMIN D) 1000 UNITS tablet Take 1,000 Units by mouth in the morning.    ? clopidogrel (PLAVIX) 75 MG tablet TAKE 1 TABLET BY MOUTH  DAILY 90 tablet 3  ? Coenzyme Q10 100 MG TABS Take 100 mg by mouth in the morning.    ? ELIQUIS 5 MG TABS tablet TAKE 1 TABLET BY MOUTH TWICE  A DAY 180 tablet 1  ? ezetimibe (ZETIA) 10 MG tablet Take 1 tablet by mouth daily.    ? folic acid (FOLVITE) 1 MG tablet Take 1 tablet by mouth daily.    ? glipiZIDE-metformin (METAGLIP) 5-500 MG tablet Take 1 tablet by mouth 2 (two) times daily.    ? HUMIRA 40 MG/0.4ML PSKT INJECT 40MG  SUBCUTANEOUSLY EVERY OTHER WEEK 2 each 2  ? hydrALAZINE (APRESOLINE) 50 MG tablet TAKE 1 TABLET BY MOUTH IN  THE MORNING AND AT BEDTIME 180 tablet 3  ? metoprolol succinate (TOPROL-XL) 50 MG 24 hr tablet TAKE 1 TABLET BY MOUTH  DAILY WITH OR IMMEDIATELY  FOLLOWING A MEAL 90 tablet 3  ? neomycin-polymyxin-hydrocortisone (CORTISPORIN) 3.5-10000-1 OTIC suspension Apply 1-2 drops daily after soaking and cover with bandaid 10 mL 0  ? nitroGLYCERIN (NITROSTAT) 0.4 MG SL tablet PLACE 1 TABLET UNDER THE TONGUE EVERY 5 (FIVE) MINUTES AS NEEDED FOR CHEST  PAIN (3 DOSES MAX) 25 tablet 2  ? olmesartan-hydrochlorothiazide (BENICAR HCT) 40-25 MG tablet Take 1 tablet by mouth daily.    ? potassium chloride SA (KLOR-CON M) 20 MEQ tablet 1 tablet with food    ? simvastatin (ZOCOR) 40 MG tablet Take 1 tablet (40 mg total) by mouth at bedtime. 90 tablet 3  ? VITAMIN A PO Take 3,000 Units by mouth daily.    ? vitamin E 400 UNIT capsule Take 400 Units by mouth in the morning.    ? ?Current Facility-Administered Medications  ?Medication Dose Route Frequency Provider Last Rate Last Admin  ? sodium chloride flush (NS) 0.9 % injection 3 mL  3 mL Intravenous Q12H Lorretta Harp, MD      ? ? ?Review of Systems  ?Constitutional:  Constitutional negative. ?HENT: HENT negative.  ?Eyes: Eyes negative.  ?Respiratory: Respiratory negative.  ?Cardiovascular: Positive for claudication.  ?GI: Gastrointestinal negative.  ?Musculoskeletal: Musculoskeletal negative.  ?Skin: Skin negative.  ?Neurological: Neurological negative. ?Hematologic: Hematologic/lymphatic negative.  ?Psychiatric: Psychiatric negative.   ? ?   ?Objective:  ?Objective  ?Vitals:  ? 03/08/22 1007  ?BP: (!) 149/103  ?Pulse: (!) 57  ?Resp: 20  ?Temp: 98.2 ?F (36.8 ?C)  ?SpO2: 96%  ? ? ? ?Physical Exam ?HENT:  ?   Head: Normocephalic.  ?   Nose:  ?   Comments: Wearing a mask ?Eyes:  ?   Pupils: Pupils are equal, round, and reactive to light.  ?Neck:  ?   Vascular: No carotid bruit.  ?Cardiovascular:  ?   Rate and Rhythm: Normal rate.  ?   Pulses:     ?     Popliteal pulses are 2+ on the right side and 0 on the left side.  ?     Dorsalis pedis pulses are 0 on the right side and 0 on the left side.  ?     Posterior tibial pulses are 2+ on the right side and 0 on the left side.  ?Pulmonary:  ?   Effort: Pulmonary effort is normal.  ?   Breath sounds: Normal breath sounds.  ?Abdominal:  ?   General: Abdomen is flat.  ?   Palpations: Abdomen is soft.  ?Musculoskeletal:     ?   General: Normal range of motion.  ?Skin: ?   General:  Skin is warm and dry.  ?   Capillary Refill: Capillary refill takes less than 2 seconds.  ?   Comments: She has a bandage on her right great toe  ?Neurological:  ?  General: No focal deficit present.  ?   Mental Status: She is alert.  ?Psychiatric:     ?   Mood and Affect: Mood normal.     ?   Thought Content: Thought content normal.  ? ? ?Data: ?ABI Findings:  ?+---------+------------------+-----+----------+--------+  ?Right    Rt Pressure (mmHg)IndexWaveform  Comment   ?+---------+------------------+-----+----------+--------+  ?Brachial 154                                        ?+---------+------------------+-----+----------+--------+  ?PTA      151               0.92 triphasic           ?+---------+------------------+-----+----------+--------+  ?PERO     146               0.89 monophasic          ?+---------+------------------+-----+----------+--------+  ?DP       135               0.82 triphasic           ?+---------+------------------+-----+----------+--------+  ?Great Toe140               0.85 Normal              ?+---------+------------------+-----+----------+--------+  ? ?+---------+------------------+-----+----------+-------+  ?Left     Lt Pressure (mmHg)IndexWaveform  Comment  ?+---------+------------------+-----+----------+-------+  ?Brachial 164                                       ?+---------+------------------+-----+----------+-------+  ?PTA      87                0.53 monophasic         ?+---------+------------------+-----+----------+-------+  ?PERO     81                0.53 monophasic         ?+---------+------------------+-----+----------+-------+  ?DP       85                0.52 monophasic         ?+---------+------------------+-----+----------+-------+  ?Great Toe93                0.57 Abnormal           ?+---------+------------------+-----+----------+-------+   ? ?+-------+-----------+-----------+------------+------------+  ?ABI/TBIToday's ABIToday's TBIPrevious ABIPrevious TBI  ?+-------+-----------+-----------+------------+------------+  ?Right  0.92       0.85       1.08        0.90          ?+-------+-----------+-----------+------------+------------+  ?Left   0.53       0.57       0.

## 2022-03-09 ENCOUNTER — Telehealth: Payer: Self-pay | Admitting: *Deleted

## 2022-03-09 ENCOUNTER — Encounter: Payer: Self-pay | Admitting: Physician Assistant

## 2022-03-09 ENCOUNTER — Ambulatory Visit: Payer: Medicare Other | Admitting: Physician Assistant

## 2022-03-09 VITALS — BP 147/80 | HR 70 | Ht 62.0 in | Wt 210.2 lb

## 2022-03-09 DIAGNOSIS — Z8673 Personal history of transient ischemic attack (TIA), and cerebral infarction without residual deficits: Secondary | ICD-10-CM

## 2022-03-09 DIAGNOSIS — M170 Bilateral primary osteoarthritis of knee: Secondary | ICD-10-CM

## 2022-03-09 DIAGNOSIS — Z111 Encounter for screening for respiratory tuberculosis: Secondary | ICD-10-CM

## 2022-03-09 DIAGNOSIS — I6350 Cerebral infarction due to unspecified occlusion or stenosis of unspecified cerebral artery: Secondary | ICD-10-CM

## 2022-03-09 DIAGNOSIS — M810 Age-related osteoporosis without current pathological fracture: Secondary | ICD-10-CM

## 2022-03-09 DIAGNOSIS — L408 Other psoriasis: Secondary | ICD-10-CM

## 2022-03-09 DIAGNOSIS — Z79899 Other long term (current) drug therapy: Secondary | ICD-10-CM

## 2022-03-09 DIAGNOSIS — L4050 Arthropathic psoriasis, unspecified: Secondary | ICD-10-CM | POA: Diagnosis not present

## 2022-03-09 DIAGNOSIS — Z8639 Personal history of other endocrine, nutritional and metabolic disease: Secondary | ICD-10-CM

## 2022-03-09 DIAGNOSIS — Z8679 Personal history of other diseases of the circulatory system: Secondary | ICD-10-CM

## 2022-03-09 DIAGNOSIS — E785 Hyperlipidemia, unspecified: Secondary | ICD-10-CM

## 2022-03-09 NOTE — Telephone Encounter (Signed)
Labs received from:[facility] ? ?Drawn on:02/28/2022 ? ?Reviewed by:Sherron Ales, PA-C ? ?Labs drawn:Hgb A1C, CBC, CMP, Vitamin D ? ?Results:Hgb A1C 8.2 ? Glucose  265 ? EOS 0.5 ? Hct 36.8 ? RDW 11.3 ? MPV 10.8 ? ?Patient is on Humira 40 mg SQ every 14 days.  ?

## 2022-03-10 ENCOUNTER — Other Ambulatory Visit: Payer: Self-pay

## 2022-03-10 ENCOUNTER — Encounter: Payer: Self-pay | Admitting: Podiatry

## 2022-03-10 ENCOUNTER — Ambulatory Visit: Payer: Medicare Other | Admitting: Podiatry

## 2022-03-10 DIAGNOSIS — E1151 Type 2 diabetes mellitus with diabetic peripheral angiopathy without gangrene: Secondary | ICD-10-CM | POA: Diagnosis not present

## 2022-03-10 DIAGNOSIS — M79674 Pain in right toe(s): Secondary | ICD-10-CM

## 2022-03-10 DIAGNOSIS — Z9889 Other specified postprocedural states: Secondary | ICD-10-CM

## 2022-03-10 DIAGNOSIS — M79675 Pain in left toe(s): Secondary | ICD-10-CM | POA: Diagnosis not present

## 2022-03-10 DIAGNOSIS — Z7189 Other specified counseling: Secondary | ICD-10-CM | POA: Insufficient documentation

## 2022-03-10 DIAGNOSIS — B351 Tinea unguium: Secondary | ICD-10-CM

## 2022-03-10 MED ORDER — DOXYCYCLINE HYCLATE 100 MG PO CAPS: 100.0000 mg | ORAL_CAPSULE | Freq: Two times a day (BID) | ORAL | 0 refills | Status: AC

## 2022-03-15 ENCOUNTER — Other Ambulatory Visit: Payer: Self-pay

## 2022-03-15 ENCOUNTER — Ambulatory Visit: Payer: Medicare Other | Admitting: Cardiology

## 2022-03-15 ENCOUNTER — Encounter: Payer: Self-pay | Admitting: Cardiology

## 2022-03-15 VITALS — BP 104/60 | HR 79 | Ht 62.0 in | Wt 207.0 lb

## 2022-03-15 DIAGNOSIS — I739 Peripheral vascular disease, unspecified: Secondary | ICD-10-CM

## 2022-03-15 DIAGNOSIS — I480 Paroxysmal atrial fibrillation: Secondary | ICD-10-CM | POA: Diagnosis not present

## 2022-03-15 DIAGNOSIS — I10 Essential (primary) hypertension: Secondary | ICD-10-CM

## 2022-03-15 NOTE — Progress Notes (Signed)
?Electrophysiology Office Follow up Visit Note:   ? ?Date:  03/15/2022  ? ?ID:  Margaret Hobbs, DOB 1951-01-02, MRN [id number] ? ?PCP:  Ginger Organ., MD  ?[facility] Cardiologist:  Jenkins Rouge, MD  ?[facility] Electrophysiologist:  Vickie Epley, MD  ? ? ?Interval History:   ? ?Margaret Hobbs is a 71 y.o. female who presents for a follow up visit. Their medical history includes coronary artery disease post PCI, hypertension, hyperlipidemia, TIA, psoriatic arthritis, peripheral vascular disease.  She is on Eliquis for stroke prophylaxis.  She was previously on amiodarone but this was discontinued because of dizziness.  She last all Dr. Johnsie Cancel on February 06, 2022.  At that visit she was in atrial fibrillation but was asymptomatic. ?She recently wore a heart monitor which showed an 18% atrial fibrillation burden.  At her last appointment in 2022 we discussed using Tikosyn but deferred because of the very low burden of A-fib. ? ? ?  ? ?Past Medical History:  ?Diagnosis Date  ? Aphasia   ? Arthritis   ? CAD (coronary artery disease)   ? Cerebrovascular disease   ? CVA (cerebral vascular accident) [facility])   ? Depressed   ? Diabetes mellitus   ? DM2 (diabetes mellitus, type 2) ([place])   ? HLD (hyperlipidemia)   ? HTN (hypertension)   ? Hypercholesteremia   ? Hyperparathyroidism   ? MI (myocardial infarction) ([place])   ? Osteoporosis   ? TIA (transient ischemic attack)   ? UTI (urinary tract infection)   ? ? ?Past Surgical History:  ?Procedure Laterality Date  ? ABDOMINAL AORTOGRAM W/LOWER EXTREMITY Bilateral 03/31/2021  ? Procedure: ABDOMINAL AORTOGRAM W/LOWER EXTREMITY;  Surgeon: Lorretta Harp, MD;  Location: [facility];  Service: Cardiovascular;  Laterality: Bilateral;  ? ABDOMINAL HYSTERECTOMY    ? CATARACT EXTRACTION W/PHACO Bilateral 2017  ? Dr. Kathlen Mody  ? hysterectomy - unknown type    ? ? ?Current Medications: ?Current Meds  ?Medication Sig  ? amoxicillin (AMOXIL) 500 MG capsule  Take 2,000 mg by mouth as directed. Take 4 capsules (2000 mg) by mouth 1 hour prior to dental procedures  ? Ascorbic Acid (VITAMIN C) 500 MG tablet Take 500 mg by mouth 3 (three) times a week.  ? cholecalciferol (VITAMIN D) 1000 UNITS tablet Take 1,000 Units by mouth in the morning.  ? clopidogrel (PLAVIX) 75 MG tablet TAKE 1 TABLET BY MOUTH  DAILY  ? Coenzyme Q10 100 MG TABS Take 100 mg by mouth in the morning.  ? doxycycline (VIBRAMYCIN) 100 MG capsule Take 1 capsule (100 mg total) by mouth 2 (two) times daily for 7 days.  ? ELIQUIS 5 MG TABS tablet TAKE 1 TABLET BY MOUTH TWICE A DAY  ? ezetimibe (ZETIA) 10 MG tablet Take 1 tablet by mouth daily.  ? folic acid (FOLVITE) 1 MG tablet Take 1 tablet by mouth daily.  ? glipiZIDE-metformin (METAGLIP) 5-500 MG tablet Take 1 tablet by mouth 2 (two) times daily.  ? HUMIRA 40 MG/0.4ML PSKT INJECT 40MG  SUBCUTANEOUSLY EVERY OTHER WEEK  ? hydrALAZINE (APRESOLINE) 50 MG tablet TAKE 1 TABLET BY MOUTH IN  THE MORNING AND AT BEDTIME  ? metoprolol succinate (TOPROL-XL) 50 MG 24 hr tablet TAKE 1 TABLET BY MOUTH  DAILY WITH OR IMMEDIATELY  FOLLOWING A MEAL  ? neomycin-polymyxin-hydrocortisone (CORTISPORIN) 3.5-10000-1 OTIC suspension Apply 1-2 drops daily after soaking and cover with bandaid  ? nitroGLYCERIN (NITROSTAT) 0.4 MG SL tablet PLACE 1 TABLET UNDER THE TONGUE EVERY  5 (FIVE) MINUTES AS NEEDED FOR CHEST PAIN (3 DOSES MAX)  ? olmesartan-hydrochlorothiazide (BENICAR HCT) 40-25 MG tablet Take 1 tablet by mouth daily.  ? potassium chloride SA (KLOR-CON M) 20 MEQ tablet 1 tablet with food  ? simvastatin (ZOCOR) 40 MG tablet Take 1 tablet (40 mg total) by mouth at bedtime.  ? VITAMIN A PO Take 3,000 Units by mouth daily.  ? vitamin E 400 UNIT capsule Take 400 Units by mouth in the morning.  ? ?Current Facility-Administered Medications for the 03/15/22 encounter (Office Visit) with Vickie Epley, MD  ?Medication  ? sodium chloride flush (NS) 0.9 % injection 3 mL  ?  ? ?Allergies:    Remicade [infliximab], Amiodarone, and Sulfa antibiotics  ? ?Social History  ? ?Socioeconomic History  ? Marital status: Widowed  ?  Spouse name: Not on file  ? Number of children: 1  ? Years of education: Not on file  ? Highest education level: Not on file  ?Occupational History  ? Not on file  ?Tobacco Use  ? Smoking status: Never  ?  Passive exposure: Never  ? Smokeless tobacco: Never  ?Vaping Use  ? Vaping Use: Never used  ?Substance and Sexual Activity  ? Alcohol use: No  ? Drug use: Never  ? Sexual activity: Not on file  ?Other Topics Concern  ? Not on file  ?Social History Narrative  ? Not on file  ? ?Social Determinants of Health  ? ?Financial Resource Strain: Not on file  ?Food Insecurity: Not on file  ?Transportation Needs: Not on file  ?Physical Activity: Not on file  ?Stress: Not on file  ?Social Connections: Not on file  ?  ? ?Family History: ?The patient's family history includes Alcohol abuse in an other family member; Coronary artery disease in an other family member; Diabetes in her brother and another family member; Heart disease in her mother; Hyperlipidemia in her mother; Hypertension in her mother, son, and another family member; Sleep apnea in her son. ? ?ROS:   ?Please see the history of present illness.    ?All other systems reviewed and are negative. ? ?EKGs/Labs/Other Studies Reviewed:   ? ?The following studies were reviewed today: ? ?February 06, 2022 [employer] monitor personally reviewed ?18% A-fib burden ? ?February 06, 2022 EKG shows a QTc of 450 ms ? ? ?EKG:  The ekg ordered today demonstrates sinus rhythm.  QTc 460 ms.  PVC.  Leftward axis. ? ?Recent Labs: ?04/01/2021: Magnesium 1.6; TSH 1.254 ?09/17/2021: ALT 26; BUN 26; Creatinine, Ser 0.99; Hemoglobin 13.0; Platelets 260; Potassium 4.4; Sodium 139  ?Recent Lipid Panel ?   ?Component Value Date/Time  ? CHOL 138 08/24/2016 0912  ? TRIG 197 (H) 08/24/2016 0912  ? HDL 40 (L) 08/24/2016 0912  ? CHOLHDL 3.5 08/24/2016 0912  ? VLDL 39  (H) 08/24/2016 0912  ? LDLCALC 59 08/24/2016 0912  ? ? ?Physical Exam:   ? ?VS:  BP 104/60 (BP Location: Right Arm, Patient Position: Sitting, Cuff Size: Large)   Pulse 79   Ht 5\' 2"  (1.575 m)   Wt 207 lb (93.9 kg)   SpO2 98%   BMI 37.86 kg/m?    ? ?Wt Readings from Last 3 Encounters:  ?03/15/22 207 lb (93.9 kg)  ?03/09/22 210 lb 3.2 oz (95.3 kg)  ?03/08/22 208 lb (94.3 kg)  ?  ? ?GEN:  Well nourished, well developed in no acute distress ?HEENT: Normal ?NECK: No JVD; No carotid bruits ?LYMPHATICS: No lymphadenopathy ?CARDIAC: RRR,  no murmurs, rubs, gallops ?RESPIRATORY:  Clear to auscultation without rales, wheezing or rhonchi  ?ABDOMEN: Soft, non-tender, non-distended ?MUSCULOSKELETAL:  No edema; No deformity  ?SKIN: Warm and dry ?NEUROLOGIC:  Alert and oriented x 3 ?PSYCHIATRIC:  Normal affect  ? ? ? ?  ? ?ASSESSMENT:   ? ?1. Paroxysmal atrial fibrillation (HCC)   ?2. Primary hypertension   ?3. PAD (peripheral artery disease) ([place])   ? ?PLAN:   ? ?In order of problems listed above: ? ?#Paroxysmal atrial fibrillation ?Had a long discussion in clinic today about Tikosyn.  We had discussed this previously but decided to wait given her low burden of atrial fibrillation at the time of our first appointment.  Now that her burden has increased substantially, I would recommend proceeding with Tikosyn loading.  She is on Eliquis and tolerating this well.  Her QTc is acceptable for initiating Tikosyn.  After long discussion, the patient would like to take some time to think about her options before moving forward which I think is reasonable.  She will let us know if she would like to proceed with Tikosyn. ? ? ? ?Medication Adjustments/Labs and Tests Ordered: ?Current medicines are reviewed at length with the patient today.  Concerns regarding medicines are outlined above.  ?Orders Placed This Encounter  ?Procedures  ? EKG 12-Lead  ? ?No orders of the defined types were placed in this encounter. ? ? ? ?Signed, ?Lars Mage, MD, [facility], [facility] ?03/15/2022 10:24 PM    ?Electrophysiology ?[place] ?

## 2022-03-15 NOTE — Patient Instructions (Addendum)
Medication Instructions:  ? ?Your physician recommends that you continue on your current medications as directed. Please refer to the Current Medication list given to you today. ? ?*If you need a refill on your cardiac medications before your next appointment, please call your pharmacy* ? ? ?Lab Work: ? ?None Ordered  ? ?If you have labs (blood work) drawn today and your tests are completely normal, you will receive your results only by: ?MyChart Message (if you have MyChart) OR ?A paper copy in the mail ?If you have any lab test that is abnormal or we need to change your treatment, we will call you to review the results. ? ? ?Testing/Procedures: ? ?None Ordered ? ?Follow-Up: ?At [facility], you and your health needs are our priority.  As part of our continuing mission to provide you with exceptional heart care, we have created designated Provider Care Teams.  These Care Teams include your primary Cardiologist (physician) and Advanced Practice Providers (APPs -  Physician Assistants and Nurse Practitioners) who all work together to provide you with the care you need, when you need it. ? ?We recommend signing up for the patient portal called "MyChart".  Sign up information is provided on this After Visit Summary.  MyChart is used to connect with patients for Virtual Visits (Telemedicine).  Patients are able to view lab/test results, encounter notes, upcoming appointments, etc.  Non-urgent messages can be sent to your provider as well.   ?To learn more about what you can do with MyChart, go to [email or web address].   ? ?Your next appointment: As needed call Inetta Fermo or Stacy at the [facility] to move forward with medication.  ? ?The format for your next ap ? ? ?Information for medication if you decide to move forward: ? ?Tikosyn (Dofetilide) Hospital Admission  ? ?Prior to day of admission:  ?Check with drug insurance company for cost of drug to ensure affordability --- Dofetilide 500 mcg twice a day.  ?GoodRx is  an option if insurance copay is unaffordable.  ? ?All patients are tested for COVID-19 prior to admission.  ? ?No Benadryl is allowed 3 days prior to admission.  ? ?Please ensure no missed doses of your anticoagulation (blood thinner) for 3 weeks prior to admission. If a dose is missed please notify our office immediately.  ? ?A pharmacist will review all your medications for potential interactions with Tikosyn. If any medication changes are needed prior to admission we will be in touch with you.  ? ?If any new medications are started AFTER your admission date is set with Kennyth Arnold RN. Please notify our office immediately so your medication list can be updated and reviewed by our pharmacist again.  ?On day of admission:  ?Tikosyn initiation requires a 3 night/4 day hospital stay with constant telemetry monitoring. You will have an EKG after each dose of Tikosyn as well as daily lab draws.  ? ?If the drug does not convert you to normal rhythm a cardioversion after the 4th dose of Tikosyn.  ? ?[facility] office visit on the morning of admission is needed for preliminary labs/ekg.  ? ?Time of admission is dependent on bed availability in the hospital. In some instances, you will be sent home until bed is available. Rarely admission can be delayed to the following day if hospital census prevents available beds.  ? ?You may bring personal belongings/clothing with you to the hospital. Please leave your suitcase in the car until you arrive in admissions.  ? ?Questions please call  our office at [phone]  ? ? ?

## 2022-03-19 NOTE — Progress Notes (Signed)
?  Subjective:  ?Patient ID: Margaret Hobbs, female    DOB: 08/09/1951,  MRN: [id number] ? ?Margaret Hobbs presents to clinic today for at risk foot care. Pt has h/o NIDDM with PAD and painful elongated mycotic toenails 1-5 bilaterally which are tender when wearing enclosed shoe gear. Pain is relieved with periodic professional debridement. ? ?Last HgA1c was 6.3%. Patient did not check blood glucose today. ? ?New problem(s):  Patient is s/p chemical matrixectomy of right great toe lateral border performed on February 14, 2022, by Dr. Sherryle Lis. She states she has been soaking as instructed, but states digit is still sore and has not healed. ? ?PCP is Ginger Organ., MD , and last visit was October 31, 2021. ? ?Allergies  ?Allergen Reactions  ? Remicade [Infliximab] Other (See Comments)  ?  Convulsion and felt cold   ? Amiodarone   ?  Per pr report she was dizzy, weak and lethargic  ? Sulfa Antibiotics Rash  ? ?Review of Systems: Negative except as noted in the HPI. ? ?Objective: ?Margaret Hobbs is a pleasant 71 y.o. female in NAD. AAO X 3. ? ?Vascular Examination: ?CFT immediate b/l LE. Palpable DP/PT pulses b/l LE. Digital hair sparse b/l. Skin temperature gradient WNL b/l. No pain with calf compression b/l. No edema noted b/l. No cyanosis or clubbing noted b/l LE. ? ?Dermatological Examination: ?Pedal integument with normal turgor, texture and tone b/l LE. No open wounds b/l. No interdigital macerations b/l. Toenails 2-5 bilaterally and L hallux elongated, thickened, discolored with subungual debris. +Tenderness with dorsal palpation of nailplates. No hyperkeratotic or porokeratotic lesions present.  ? ?Evidence of partial nail avulsion lateral border right hallux. There is erythema at proximal nailfold. +POP. No purulence expressed from nail border. There is mixed dried serosanguinous drainage and also soft, friable layer of tissue overlying granular nail border. Post removal of this tissue,  there is no abscess noted. This appears to be congestion of fluid of the nailfold. ? ?Musculoskeletal Examination: ?Muscle strength 5/5 to all LE muscle groups of left lower extremity. Muscle strength 3/5 to all LE muscle groups of right lower extremity. ? ?Neurological Examination: ?Protective sensation intact 5/5 intact bilaterally with 10g monofilament b/l. Vibratory sensation intact b/l. ? ? ?  Latest Ref Rng & Units 04/01/2021  ?  7:04 AM  ?Hemoglobin A1C  ?Hemoglobin-A1c 4.8 - 5.6 % 7.2    ? ?Assessment/Plan: ?1. Pain due to onychomycosis of toenails of both feet   ?2. Status post nail surgery   ?3. Type II diabetes mellitus with peripheral circulatory disorder (HCC)   ?  ?-Patient was evaluated and treated. All patient's and/or POA's questions/concerns answered on today's visit. ?-Right great toe lateral nail border curretaged of tissue clogging operative nail border. Irrigated with hydrogen peroxide. Cleansed with alcohol. Band-aid applied. She is to continue using  Cortisporin Otic Solution once daily. If condition does not improve, call office. I will have her follow up with Dr. Sherryle Lis in 2-3 weeks. ?-Mycotic toenails 2-5 bilaterally and L hallux were debrided in length and girth with sterile nail nippers and dremel without iatrogenic bleeding. ?-Rx for Doxycyline 100 mg, #14, to be taken one capsule twice daily for 7 days. ?-Patient/POA to call should there be question/concern in the interim.  ? ?Return in about 3 weeks (around 03/31/2022). ? ?Marzetta Board, DPM  ?

## 2022-03-30 ENCOUNTER — Ambulatory Visit: Payer: Medicare Other | Admitting: Podiatry

## 2022-03-30 DIAGNOSIS — L600 Ingrowing nail: Secondary | ICD-10-CM | POA: Diagnosis not present

## 2022-04-02 NOTE — Progress Notes (Signed)
?  Subjective:  ?Patient ID: Margaret Hobbs, female    DOB: 10-25-1951,  MRN: [id number] ? ?Chief Complaint  ?Patient presents with  ? Diabetic Ulcer  ?  Right foot  ? ? ?71 y.o. female returns for follow-up with the above complaint. History confirmed with patient.  Doing well seems to be healing fine ? ?Objective:  ?Physical Exam: ?warm, good capillary refill, no trophic changes or ulcerative lesions, normal DP and PT pulses and normal sensory exam. ?Right hallux matricectomy site is healing well minimal drainage ? ?Assessment:  ? ?1. Ingrowing right great toenail   ? ? ? ? ? ?Plan:  ?Patient was evaluated and treated and all questions answered. ? ? ? ? ?Ingrown Nail, right ?-Doing well she may leave the matricectomy site open to air at this point discontinue soaks and ointment.  I will see her back as needed if this recurs or has other issues ? ? ?Return if symptoms worsen or fail to improve.  ? ?

## 2022-04-14 ENCOUNTER — Other Ambulatory Visit: Payer: Self-pay | Admitting: Physician Assistant

## 2022-04-14 NOTE — Telephone Encounter (Signed)
Next Visit: 08/23/2022 ? ?Last Visit: 03/09/2022 ? ?Last Fill: 01/26/2022 ? ?[id number] arthritis  ? ?Current Dose per office note 03/09/2022: Humira 40 mg sq injections every 14 days ? ?Labs: 02/28/2022  Glucose  265 EOS 0.5 Hct 36.8 RDW 11.3 MPV 10.8 ? ?TB Gold: 07/06/2021  ? ?Okay to refill Humira?  ?

## 2022-05-04 ENCOUNTER — Encounter (INDEPENDENT_AMBULATORY_CARE_PROVIDER_SITE_OTHER): Payer: Self-pay | Admitting: Ophthalmology

## 2022-05-04 ENCOUNTER — Encounter (INDEPENDENT_AMBULATORY_CARE_PROVIDER_SITE_OTHER): Payer: Medicare Other | Admitting: Ophthalmology

## 2022-05-04 ENCOUNTER — Ambulatory Visit (INDEPENDENT_AMBULATORY_CARE_PROVIDER_SITE_OTHER): Payer: Medicare Other | Admitting: Ophthalmology

## 2022-05-04 DIAGNOSIS — E113492 Type 2 diabetes mellitus with severe nonproliferative diabetic retinopathy without macular edema, left eye: Secondary | ICD-10-CM

## 2022-05-04 DIAGNOSIS — H43813 Vitreous degeneration, bilateral: Secondary | ICD-10-CM | POA: Diagnosis not present

## 2022-05-04 DIAGNOSIS — H33321 Round hole, right eye: Secondary | ICD-10-CM | POA: Diagnosis not present

## 2022-05-04 DIAGNOSIS — E113491 Type 2 diabetes mellitus with severe nonproliferative diabetic retinopathy without macular edema, right eye: Secondary | ICD-10-CM

## 2022-05-04 NOTE — Assessment & Plan Note (Signed)

## 2022-05-04 NOTE — Assessment & Plan Note (Signed)
No new retinal holes or tears right ?

## 2022-05-04 NOTE — Progress Notes (Signed)
? ? ?05/04/2022 ? ?  ? ?CHIEF COMPLAINT ?Patient presents for  ?Chief Complaint  ?Patient presents with  ? Diabetic Retinopathy without Macular Edema  ? ? ? ? ?HISTORY OF PRESENT ILLNESS: ?Margaret Hobbs is a 71 y.o. female who presents to the clinic today for:  ? ?HPI   ?6 mos FU OU OCT FP. ?Pt stated no changes in vision. ?Pt denies floaters and FOL. ?Pts A1C on Tuesday was 8.8. ? ? ?Last edited by Silvestre Moment on 05/04/2022 10:09 AM.  ?  ? ? ?Referring physician: ?Ginger Organ., MD ?[address] ?[place],  [place] [address] ? ?HISTORICAL INFORMATION:  ? ?Selected notes from the [facility] ?  ? ?Lab Results  ?Component Value Date  ? HGBA1C 7.2 (H) 04/01/2021  ?  ? ?CURRENT MEDICATIONS: ?No current outpatient medications on file. (Ophthalmic Drugs)  ? ?No current facility-administered medications for this visit. (Ophthalmic Drugs)  ? ?Current Outpatient Medications (Other)  ?Medication Sig  ? amoxicillin (AMOXIL) 500 MG capsule Take 2,000 mg by mouth as directed. Take 4 capsules (2000 mg) by mouth 1 hour prior to dental procedures  ? Ascorbic Acid (VITAMIN C) 500 MG tablet Take 500 mg by mouth 3 (three) times a week.  ? cholecalciferol (VITAMIN D) 1000 UNITS tablet Take 1,000 Units by mouth in the morning.  ? clopidogrel (PLAVIX) 75 MG tablet TAKE 1 TABLET BY MOUTH  DAILY  ? Coenzyme Q10 100 MG TABS Take 100 mg by mouth in the morning.  ? ELIQUIS 5 MG TABS tablet TAKE 1 TABLET BY MOUTH TWICE A DAY  ? ezetimibe (ZETIA) 10 MG tablet Take 1 tablet by mouth daily.  ? folic acid (FOLVITE) 1 MG tablet Take 1 tablet by mouth daily.  ? glipiZIDE-metformin (METAGLIP) 5-500 MG tablet Take 1 tablet by mouth 2 (two) times daily.  ? HUMIRA 40 MG/0.4ML PSKT INJECT 40MG  SUBCUTANEOUSLY EVERY OTHER WEEK  ? hydrALAZINE (APRESOLINE) 50 MG tablet TAKE 1 TABLET BY MOUTH IN  THE MORNING AND AT BEDTIME  ? metoprolol succinate (TOPROL-XL) 50 MG 24 hr tablet TAKE 1 TABLET BY MOUTH  DAILY WITH OR IMMEDIATELY  FOLLOWING A MEAL  ?  neomycin-polymyxin-hydrocortisone (CORTISPORIN) 3.5-10000-1 OTIC suspension Apply 1-2 drops daily after soaking and cover with bandaid  ? nitroGLYCERIN (NITROSTAT) 0.4 MG SL tablet PLACE 1 TABLET UNDER THE TONGUE EVERY 5 (FIVE) MINUTES AS NEEDED FOR CHEST PAIN (3 DOSES MAX)  ? olmesartan-hydrochlorothiazide (BENICAR HCT) 40-25 MG tablet Take 1 tablet by mouth daily.  ? potassium chloride SA (KLOR-CON M) 20 MEQ tablet 1 tablet with food  ? simvastatin (ZOCOR) 40 MG tablet Take 1 tablet (40 mg total) by mouth at bedtime.  ? VITAMIN A PO Take 3,000 Units by mouth daily.  ? vitamin E 400 UNIT capsule Take 400 Units by mouth in the morning.  ? ?Current Facility-Administered Medications (Other)  ?Medication Route  ? sodium chloride flush (NS) 0.9 % injection 3 mL Intravenous  ? ? ? ? ?REVIEW OF SYSTEMS: ?ROS   ?Negative for: Constitutional, Gastrointestinal, Neurological, Skin, Genitourinary, Musculoskeletal, HENT, Endocrine, Cardiovascular, Eyes, Respiratory, Psychiatric, Allergic/Imm, Heme/Lymph ?Last edited by Silvestre Moment on 05/04/2022 10:09 AM.  ?  ? ? ? ?ALLERGIES ?Allergies  ?Allergen Reactions  ? Remicade [Infliximab] Other (See Comments)  ?  Convulsion and felt cold   ? Amiodarone   ?  Per pr report she was dizzy, weak and lethargic  ? Sulfa Antibiotics Rash  ? ? ?PAST MEDICAL HISTORY ?Past Medical History:  ?  Diagnosis Date  ? Aphasia   ? Arthritis   ? CAD (coronary artery disease)   ? Cerebrovascular disease   ? CVA (cerebral vascular accident) [facility])   ? Depressed   ? Diabetes mellitus   ? DM2 (diabetes mellitus, type 2) ([place])   ? HLD (hyperlipidemia)   ? HTN (hypertension)   ? Hypercholesteremia   ? Hyperparathyroidism   ? MI (myocardial infarction) ([place])   ? Osteoporosis   ? TIA (transient ischemic attack)   ? UTI (urinary tract infection)   ? ?Past Surgical History:  ?Procedure Laterality Date  ? ABDOMINAL AORTOGRAM W/LOWER EXTREMITY Bilateral 03/31/2021  ? Procedure: ABDOMINAL AORTOGRAM W/LOWER EXTREMITY;  Surgeon:  Lorretta Harp, MD;  Location: [facility];  Service: Cardiovascular;  Laterality: Bilateral;  ? ABDOMINAL HYSTERECTOMY    ? CATARACT EXTRACTION W/PHACO Bilateral 2017  ? Dr. Kathlen Mody  ? hysterectomy - unknown type    ? ? ?FAMILY HISTORY ?Family History  ?Problem Relation Age of Onset  ? Heart disease Mother   ?     pacemaker  ? Hypertension Mother   ? Hyperlipidemia Mother   ? Diabetes Brother   ?     pre-diabetic   ? Hypertension Son   ? Sleep apnea Son   ? Diabetes Other   ? Hypertension Other   ? Alcohol abuse Other   ? Coronary artery disease Other   ? ? ?SOCIAL HISTORY ?Social History  ? ?Tobacco Use  ? Smoking status: Never  ?  Passive exposure: Never  ? Smokeless tobacco: Never  ?Vaping Use  ? Vaping Use: Never used  ?Substance Use Topics  ? Alcohol use: No  ? Drug use: Never  ? ?  ? ?  ? ?OPHTHALMIC EXAM: ? ?Base Eye Exam   ? ? Visual Acuity (ETDRS)   ? ?   Right Left  ? Dist cc 20/20 20/20  ? ? Correction: Glasses  ? ?  ?  ? ? Tonometry (Tonopen, 10:14 AM)   ? ?   Right Left  ? Pressure 12 13  ? ?  ?  ? ? Pupils   ? ?   Pupils APD  ? Right PERRL None  ? Left PERRL None  ? ?  ?  ? ? Visual Fields   ? ?   Left Right  ?  Full Full  ? ?  ?  ? ? Extraocular Movement   ? ?   Right Left  ?  Full Full  ? ?  ?  ? ? Neuro/Psych   ? ? Oriented x3: Yes  ? Mood/Affect: Normal  ? ?  ?  ? ? Dilation   ? ? Both eyes: 1.0% Mydriacyl, 2.5% Phenylephrine @ 10:14 AM  ? ?  ?  ? ?  ? ?Slit Lamp and Fundus Exam   ? ? External Exam   ? ?   Right Left  ? External Normal Normal  ? ?  ?  ? ? Slit Lamp Exam   ? ?   Right Left  ? Lids/Lashes Normal Normal  ? Conjunctiva/Sclera White and quiet White and quiet  ? Cornea Clear Clear  ? Anterior Chamber Deep and quiet Deep and quiet  ? Iris Round and reactive Round and reactive  ? Lens Posterior chamber intraocular lens Posterior chamber intraocular lens  ? Anterior Vitreous Normal Normal  ? ?  ?  ? ? Fundus Exam   ? ?   Right Left  ?  Posterior Vitreous Normal Normal  ? Disc  Normal Normal  ? C/D Ratio 0.45 0.55  ? Macula Microaneurysms, no macular thickening Microaneurysms, no macular thickening  ? Vessels Severe NPDR, no progression Severe NPDR, no progression  ? Periphery Good local PRP temporal window, Normal  ? ?  ?  ? ?  ? ? ?IMAGING AND PROCEDURES  ?Imaging and Procedures for 05/04/22 ? ?OCT, Retina - OU - Both Eyes   ? ?   ?Right Eye ?Quality was good. Scan locations included subfoveal. Central Foveal Thickness: 249. Progression has been stable. Findings include normal foveal contour.  ? ?Left Eye ?Quality was good. Scan locations included subfoveal. Central Foveal Thickness: 251. Progression has been stable. Findings include normal foveal contour.  ? ?Notes ?No signs of active maculopathy OU ? ?  ? ?Color Fundus Photography Optos - OU - Both Eyes   ? ?   ?Right Eye ?Progression has improved. Disc findings include normal observations, increased cup to disc ratio. Macula : microaneurysms.  ? ?Left Eye ?Progression has improved. Disc findings include normal observations. Macula : microaneurysms.  ? ?Notes ?Good PRP OD temporal window, no active change in severe NPDR ? ?OS with severe NPDR, no active areas ? ?  ? ? ?  ?  ? ?  ?ASSESSMENT/PLAN: ? ?Severe nonproliferative diabetic retinopathy of left eye ([place]) ?The nature of severe nonproliferative diabetic retinopathy discussed with the patient as well as the need for more frequent follow up and likely progression to proliferative disease in the near future. The options of continued observation versus panretinal photocoagulation at this time were reviewed as well as the risks, benefits, and alternatives. More recent option includes the use of ocular injectable medications to slow progression of retinal disease. Tight control of glucose, blood pressure, and serum lipid levels were recommended under the direction of general physician or endocrinologist, as well as avoidance of smoking and maintenance of normal body weight. The 2-year  risk of progression to proliferative diabetic retinopathy is 60%. ? ?With recent poorly controlled AODM, will need to see more frequently because of the risk of progression to PDR ? ?Severe nonproliferative

## 2022-05-04 NOTE — Assessment & Plan Note (Signed)
Severe NPDR remains, yet with risk of progression to PDR, follow-up next in 4 months ?

## 2022-05-04 NOTE — Assessment & Plan Note (Signed)
The nature of severe nonproliferative diabetic retinopathy discussed with the patient as well as the need for more frequent follow up and likely progression to proliferative disease in the near future. The options of continued observation versus panretinal photocoagulation at this time were reviewed as well as the risks, benefits, and alternatives. More recent option includes the use of ocular injectable medications to slow progression of retinal disease. Tight control of glucose, blood pressure, and serum lipid levels were recommended under the direction of general physician or endocrinologist, as well as avoidance of smoking and maintenance of normal body weight. The 2-year risk of progression to proliferative diabetic retinopathy is 60%. ? ?With recent poorly controlled AODM, will need to see more frequently because of the risk of progression to PDR ?

## 2022-06-16 ENCOUNTER — Ambulatory Visit: Payer: Medicare Other | Admitting: Podiatry

## 2022-06-16 ENCOUNTER — Encounter: Payer: Self-pay | Admitting: Podiatry

## 2022-06-16 DIAGNOSIS — M79674 Pain in right toe(s): Secondary | ICD-10-CM | POA: Diagnosis not present

## 2022-06-16 DIAGNOSIS — M79675 Pain in left toe(s): Secondary | ICD-10-CM | POA: Diagnosis not present

## 2022-06-16 DIAGNOSIS — B351 Tinea unguium: Secondary | ICD-10-CM

## 2022-06-16 DIAGNOSIS — R21 Rash and other nonspecific skin eruption: Secondary | ICD-10-CM | POA: Diagnosis not present

## 2022-06-16 DIAGNOSIS — E1151 Type 2 diabetes mellitus with diabetic peripheral angiopathy without gangrene: Secondary | ICD-10-CM

## 2022-06-25 NOTE — Progress Notes (Signed)
  Subjective:  Patient ID: Margaret Hobbs, female    DOB: 1951/01/25,  MRN: [id number]  Margaret Hobbs presents to clinic today for at risk foot care. Pt has h/o NIDDM with PAD and painful elongated mycotic toenails 1-5 bilaterally which are tender when wearing enclosed shoe gear. Pain is relieved with periodic professional debridement.  New problem(s): Patient states she has a rash on her right foot.   She is followed by [facility]. Last visit was 03/08/2022.  PCP is Cleatis Polka., MD , and last visit was June 13, 2022.  Allergies  Allergen Reactions   Remicade [Infliximab] Other (See Comments)    Convulsion and felt cold    Amiodarone     Per pr report she was dizzy, weak and lethargic   Sulfa Antibiotics Rash    Review of Systems: Negative except as noted in the HPI.  Objective: No changes noted in today's physical examination. Margaret Hobbs is a pleasant 71 y.o. female in NAD. AAO X 3.  Vascular Examination: CFT immediate b/l LE. Palpable DP/PT pulses b/l LE. Digital hair sparse b/l. Skin temperature gradient WNL b/l. No pain with calf compression b/l. No edema noted b/l. No cyanosis or clubbing noted b/l LE.  Dermatological Examination: Pedal integument with normal turgor, texture and tone b/l LE. No open wounds b/l. No interdigital macerations b/l. Toenails 2-5 bilaterally and L hallux elongated, thickened, discolored with subungual debris. +Tenderness with dorsal palpation of nailplates. Hyperkeratotic lesion(s) medial IPJ of right great toe and plantar IPJ of left great toe.  No erythema, no edema, no drainage, no fluctuance.   Evidence of partial nail avulsion lateral border right hallux. Completely healed.  Skin eruption noted dorsal aspect of right forefoot. No erythema, no edema, no weeping, no fluctuance.  Musculoskeletal Examination: Muscle strength 5/5 to all LE muscle groups of left lower extremity. Muscle  strength 3/5 to all LE muscle groups of right lower extremity.  Neurological Examination: Protective sensation intact 5/5 intact bilaterally with 10g monofilament b/l. Vibratory sensation intact b/l.  Assessment/Plan: 1. Pain due to onychomycosis of toenails of both feet   2. Rash of foot   3. Type II diabetes mellitus with peripheral circulatory disorder [facility])     -Patient was evaluated and treated. All patient's and/or POA's questions/concerns answered on today's visit. -Patient has Cortisone 10 at home. She is to apply to feet according to package directions until resolved. Call office if she has any questions/concerns. -Mycotic toenails 1-5 bilaterally were debrided in length and girth with sterile nail nippers and dremel without incident. -Patient declines paring of calluses on today's visit. -Patient/POA to call should there be question/concern in the interim.   Return in about 3 months (around 09/16/2022).  Freddie Breech, DPM

## 2022-06-26 ENCOUNTER — Other Ambulatory Visit: Payer: Self-pay | Admitting: Cardiovascular Disease

## 2022-07-10 ENCOUNTER — Other Ambulatory Visit: Payer: Self-pay | Admitting: Physician Assistant

## 2022-07-10 DIAGNOSIS — Z79899 Other long term (current) drug therapy: Secondary | ICD-10-CM

## 2022-07-10 NOTE — Telephone Encounter (Signed)
Next Visit: 08/23/2022   Last Visit: 03/09/2022   Last Fill: 04/14/2022   [id number] arthritis    Current Dose per office note 03/09/2022: Humira 40 mg sq injections every 14 days   Labs: 02/28/2022  Glucose  265 EOS 0.5 Hct 36.8 RDW 11.3 MPV 10.8   TB Gold: 07/06/2021   Left message to advise patient she is due to update labs.    Okay to refill Humira?

## 2022-07-11 ENCOUNTER — Other Ambulatory Visit: Payer: Self-pay | Admitting: *Deleted

## 2022-07-11 DIAGNOSIS — Z111 Encounter for screening for respiratory tuberculosis: Secondary | ICD-10-CM

## 2022-07-11 DIAGNOSIS — Z79899 Other long term (current) drug therapy: Secondary | ICD-10-CM

## 2022-07-12 NOTE — Progress Notes (Signed)
CBC stable.  Glucose is elevated-157.  Creatinine is borderline elevated-1.09 and GFR is slightly low-55. Rest of CMP WNL.  Please clarify if she has been taking any NSAIDs or if she has had any other medication changes.  Recommend avoiding the use of NSAIDs.  Please forward results to PCP.

## 2022-07-14 LAB — COMPLETE METABOLIC PANEL WITH GFR
AG Ratio: 1.5 (calc) (ref 1.0–2.5)
ALT: 19 U/L (ref 6–29)
AST: 20 U/L (ref 10–35)
Albumin: 4.2 g/dL (ref 3.6–5.1)
Alkaline phosphatase (APISO): 38 U/L (ref 37–153)
BUN/Creatinine Ratio: 17 (calc) (ref 6–22)
BUN: 19 mg/dL (ref 7–25)
CO2: 31 mmol/L (ref 20–32)
Calcium: 9.8 mg/dL (ref 8.6–10.4)
Chloride: 100 mmol/L (ref 98–110)
Creat: 1.09 mg/dL — ABNORMAL HIGH (ref 0.50–1.05)
Globulin: 2.8 g/dL (calc) (ref 1.9–3.7)
Glucose, Bld: 157 mg/dL — ABNORMAL HIGH (ref 65–99)
Potassium: 3.6 mmol/L (ref 3.5–5.3)
Sodium: 141 mmol/L (ref 135–146)
Total Bilirubin: 0.4 mg/dL (ref 0.2–1.2)
Total Protein: 7 g/dL (ref 6.1–8.1)
eGFR: 55 mL/min/{1.73_m2} — ABNORMAL LOW (ref >=60)

## 2022-07-14 LAB — QUANTIFERON-TB GOLD PLUS
Mitogen-NIL: 9 IU/mL
NIL: 0.01 IU/mL
QuantiFERON-TB Gold Plus: NEGATIVE
TB1-NIL: 0 IU/mL
TB2-NIL: 0 IU/mL

## 2022-07-14 LAB — CBC WITH DIFFERENTIAL/PLATELET
Absolute Monocytes: 563 cells/uL (ref 200–950)
Basophils Absolute: 51 cells/uL (ref 0–200)
Basophils Relative: 0.8 %
Eosinophils Absolute: 250 cells/uL (ref 15–500)
Eosinophils Relative: 3.9 %
HCT: 39.7 % (ref 35.0–45.0)
Hemoglobin: 13.1 g/dL (ref 11.7–15.5)
Lymphs Abs: 1626 cells/uL (ref 850–3900)
MCH: 30.8 pg (ref 27.0–33.0)
MCHC: 33 g/dL (ref 32.0–36.0)
MCV: 93.2 fL (ref 80.0–100.0)
MPV: 13.4 fL — ABNORMAL HIGH (ref 7.5–12.5)
Monocytes Relative: 8.8 %
Neutro Abs: 3910 cells/uL (ref 1500–7800)
Neutrophils Relative %: 61.1 %
Platelets: 253 10*3/uL (ref 140–400)
RBC: 4.26 10*6/uL (ref 3.80–5.10)
RDW: 12.1 % (ref 11.0–15.0)
Total Lymphocyte: 25.4 %
WBC: 6.4 10*3/uL (ref 3.8–10.8)

## 2022-07-14 NOTE — Progress Notes (Signed)
TB Gold is negative.

## 2022-07-27 ENCOUNTER — Other Ambulatory Visit: Payer: Self-pay | Admitting: Physician Assistant

## 2022-08-09 NOTE — Progress Notes (Signed)
Office Visit Note  Patient: Margaret Hobbs             Date of Birth: [birth year]           MRN: [id number]             PCP: Ginger Organ., MD Referring: Ginger Organ., MD Visit Date: 08/23/2022 Occupation: @GUAROCC @  Subjective:  Medication management  History of Present Illness: [name] is a 71 y.o. female with history of psoriatic arthritis, osteoarthritis and osteoporosis.  She states she has been doing well on Humira 40 mg subcu every 14 days.  She denies any side effects.  She denies any joint swelling on Humira.  She continues to have some psoriasis behind her ear.  She has been using over-the-counter steroid cream.  She denies any history of uveitis, trochanteric bursitis, sacroiliitis or Achilles tendinitis.  Activities of Daily Living:  Patient reports morning stiffness for 5 minutes.   Patient Denies nocturnal pain.  Difficulty dressing/grooming: Denies Difficulty climbing stairs: Reports Difficulty getting out of chair: Denies Difficulty using hands for taps, buttons, cutlery, and/or writing: Denies  Review of Systems  Constitutional:  Negative for fatigue.  HENT:  Negative for mouth sores and mouth dryness.   Eyes:  Negative for dryness.  Respiratory:  Negative for shortness of breath.   Cardiovascular:  Negative for chest pain and palpitations.  Gastrointestinal:  Positive for constipation and diarrhea. Negative for blood in stool.  Endocrine: Negative for increased urination.  Genitourinary:  Negative for involuntary urination.  Musculoskeletal:  Positive for joint pain, gait problem, joint pain and morning stiffness. Negative for joint swelling, myalgias, muscle weakness, muscle tenderness and myalgias.  Skin:  Positive for hair loss and sensitivity to sunlight. Negative for color change and rash.  Allergic/Immunologic: Negative for susceptible to infections.  Neurological:  Positive for dizziness and headaches.  Hematological:   Negative for swollen glands.  Psychiatric/Behavioral:  Negative for depressed mood and sleep disturbance. The patient is not nervous/anxious.     PMFS History:  Patient Active Problem List   Diagnosis Date Noted   Posterior vitreous detachment of both eyes 05/04/2022   Other specified counseling 03/10/2022   Epistaxis 11/29/2021   Hypercoagulable state ([place]) 11/29/2021   Morbid obesity ([place]) 11/29/2021   Pseudophakia of both eyes 04/28/2021   Claudication in peripheral vascular disease ([place]) 03/31/2021   Chronic kidney disease due to hypertension 05/18/2020   Severe nonproliferative diabetic retinopathy of right eye ([place]) 05/06/2020   Round hole of right eye 05/06/2020   Severe nonproliferative diabetic retinopathy of left eye ([place]) 05/06/2020   Retinal hemorrhage of left eye 05/06/2020   Peripheral arterial disease ([place]) 10/17/2019   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[place]) 09/17/2017   Carotid artery disease ([place]) 01/24/2017   Primary osteoarthritis of both knees 11/29/2016   Other psoriasis 11/28/2016   Psoriatic arthritis ([place]) 11/28/2016   High risk medication use 11/28/2016   Chronic kidney disease, stage 3a ([place]) 11/25/2012   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[place]) 10/29/2009   Carotid artery occlusion 10/19/2009   Age-related osteoporosis without current pathological fracture 10/19/2009   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   Type 2 diabetes mellitus ([place]) 03/30/2009   Hyperlipidemia 03/30/2009   Dyslipidemia 03/30/2009   Essential hypertension 03/30/2009   History of MI (myocardial infarction) 03/30/2009   Coronary artery disease involving native coronary artery of native heart without angina pectoris 03/30/2009   Cerebral artery occlusion  with cerebral infarction (HCC) 03/30/2009   URINARY TRACT INFECTION 03/30/2009   ARTHRITIS 03/30/2009   HYPERPARATHYROIDISM, HX OF 03/30/2009    Past Medical History:   Diagnosis Date   Aphasia    Arthritis    CAD (coronary artery disease)    Cerebrovascular disease    CVA (cerebral vascular accident) (HCC)    Depressed    Diabetes mellitus    DM2 (diabetes mellitus, type 2) (HCC)    HLD (hyperlipidemia)    HTN (hypertension)    Hypercholesteremia    Hyperparathyroidism    MI (myocardial infarction) (HCC)    Osteoporosis    TIA (transient ischemic attack)    UTI (urinary tract infection)     Family History  Problem Relation Age of Onset   Heart disease Mother        pacemaker   Hypertension Mother    Hyperlipidemia Mother    Diabetes Brother        pre-diabetic    Hypertension Son    Sleep apnea Son    Diabetes Other    Hypertension Other    Alcohol abuse Other    Coronary artery disease Other    Past Surgical History:  Procedure Laterality Date   ABDOMINAL AORTOGRAM W/LOWER EXTREMITY Bilateral 03/31/2021   Procedure: ABDOMINAL AORTOGRAM W/LOWER EXTREMITY;  Surgeon: Runell Gess, MD;  [facility]: [facility];  Service: Cardiovascular;  Laterality: Bilateral;   ABDOMINAL HYSTERECTOMY     CATARACT EXTRACTION W/PHACO Bilateral 2017   Dr. Alben Spittle   hysterectomy - unknown type     Social History   Social History Narrative   Not on file   Immunization History  Administered Date(s) Administered   PFIZER(Purple Top)SARS-COV-2 Vaccination 02/16/2020, 03/08/2020, 11/05/2020     Objective: Vital Signs: BP 101/65 (BP Location: Left Arm, Patient Position: Sitting, Cuff Size: Large)   Pulse 73   Resp 15   Ht 5\' 2"  (1.575 m)   Wt 196 lb (88.9 kg)   BMI 35.85 kg/m    Physical Exam Vitals and nursing note reviewed.  Constitutional:      Appearance: She is well-developed.  HENT:     Head: Normocephalic and atraumatic.  Eyes:     Conjunctiva/sclera: Conjunctivae normal.  Cardiovascular:     Rate and Rhythm: Normal rate and regular rhythm.     Heart sounds: Normal heart sounds.  Pulmonary:     Effort: Pulmonary effort is  normal.     Breath sounds: Normal breath sounds.  Abdominal:     General: Bowel sounds are normal.     Palpations: Abdomen is soft.  Musculoskeletal:     Cervical back: Normal range of motion.  Lymphadenopathy:     Cervical: No cervical adenopathy.  Skin:    General: Skin is warm and dry.     Capillary Refill: Capillary refill takes less than 2 seconds.     Comments: Dry scales were noted behind bilateral ear pinna.  Neurological:     Mental Status: She is alert and oriented to person, place, and time.     Comments: Right-sided hemiparesis.  She mobilizes with the help of a cane.  Psychiatric:        Behavior: Behavior normal.      Musculoskeletal Exam: Cervical scum with good range of motion.  She had no tenderness over thoracic or lumbar region.  She had thoracic kyphosis.  Shoulder joints and elbow joints with good range of motion.  She had no tenderness over wrist joints,  MCPs PIPs and DIPs.  No synovitis was noted.  She had limited range of motion of her right hip joint.  Right knee joints with good range of motion without any warmth swelling or effusion.  There was no tenderness over ankles or MTPs.  There was no tenderness over Achilles tendon or plantar fascia.  CDAI Exam: CDAI Score: -- Patient Global: --; Provider Global: -- Swollen: --; Tender: -- Joint Exam 08/23/2022   No joint exam has been documented for this visit   There is currently no information documented on the homunculus. Go to the Rheumatology activity and complete the homunculus joint exam.  Investigation: No additional findings.  Imaging: No results found.  Recent Labs: Lab Results  Component Value Date   WBC 6.4 07/11/2022   HGB 13.1 07/11/2022   PLT 253 07/11/2022   NA 141 07/11/2022   K 3.6 07/11/2022   CL 100 07/11/2022   CO2 31 07/11/2022   GLUCOSE 157 (H) 07/11/2022   BUN 19 07/11/2022   CREATININE 1.09 (H) 07/11/2022   BILITOT 0.4 07/11/2022   ALKPHOS 37 (L) 09/17/2021   AST 20  07/11/2022   ALT 19 07/11/2022   PROT 7.0 07/11/2022   ALBUMIN 3.4 (L) 09/17/2021   CALCIUM 9.8 07/11/2022   GFRAA 69 07/01/2019   QFTBGOLDPLUS NEGATIVE 07/11/2022    Speciality Comments: No specialty comments available.  Procedures:  No procedures performed Allergies: Remicade [infliximab], Amiodarone, Dapagliflozin, and Sulfa antibiotics   Assessment / Plan:     Visit Diagnoses: Psoriatic arthritis (HCC)-she denies any increased joint pain or joint swelling.  She has been taking Humira 40 mg subcu every 14 days without any side effects.  She denies any history of SI joint pain.  There is no history of uveitis.  She had no Achilles tendinitis or planter fasciitis on examination today.  She has some joint stiffness which is due to underlying osteoarthritis.  She also has difficulty with mobility, getting up from the chair and climbing stairs due to osteoarthritis in her knee joints and due to previous stroke.  Other psoriasis-she had psoriasis patches behind her ears and in her ear canal.  She has been using topical agents.  She refused a prescription topical agent..  Moisturizing agents were discussed.  High risk medication use - Humira 40 mg sq injections every 14 days.  Labs obtained on July 11, 2022 were reviewed.  Creatinine was mildly elevated.  TB gold was negative.  She was advised to get labs in October and every 3 months.  Information on immunization was placed in the AVS.  Patient was advised to hold Humira if she develops an infection and resume after the infection resolves.  Annual skin examination by dermatologist to screen for skin cancer was advised while she is on Humira.  Primary osteoarthritis of both knees-she denies any discomfort in her knee joints.  She continues to have some stiffness and uses a cane to mobilize.  Age-related osteoporosis without current pathological fracture - Reclast is currently on hold-previously prescribed by Dr. Clelia Croft.   Other medical problems  listed as follows:  History of stroke  History of atrial fibrillation - History of PAD with symptomatic left calf claudication s/p angiography.  She is followed by Dr. Gery Pray.  History of diabetes mellitus  Dyslipidemia  History of hypercholesterolemia  History of hypertension  History of coronary artery disease  Cerebral artery occlusion with cerebral infarction (HCC)  Orders: No orders of the defined types were placed in this encounter.  No orders of the defined types were placed in this encounter.    Follow-Up Instructions: Return in about 5 months (around 01/23/2023) for Psoriatic arthritis, Osteoarthritis, Osteoporosis.   Bo Merino, MD  Note - This record has been created using Editor, commissioning.  Chart creation errors have been sought, but may not always  have been located. Such creation errors do not reflect on  the standard of medical care.

## 2022-08-10 NOTE — Progress Notes (Signed)
[facility] CARDIOLOGY CONSULT NOTE       Patient ID: Margaret Hobbs MRN: [id number] DOB/AGE: 1951-06-24 71 y.o.  Primary Physician: Ginger Organ., MD Primary Cardiologist: Johnsie Cancel Vascular MD  Gwenlyn Found      HPI:  71 y.o. with history of CAD inferior MI 2008 Rx BMS to RCA, HTN, HLD TIA, psoriatic arthritis PAF And PVD. Angiogram 04/01/21 aborted due to PAF calcified CTO distal left SFA with one vessel run off and 70% right SFA.  Converted to NSR with amiodarone Started on eliquis and plavix only for CAD. Amiodarone  D/c  04/15/21 due to dizziness. She is intolerant to lipitor but takes crestor   TTE 04/14/21 EF 60-65% trivial MR AV sclerosis LA 3.6 cm  myovue 07/28/21 normal no ischemia EF 62%  She felt horrible right away with amiodarone Still with LE claudication using cane Deferred f/u with Dr Gwenlyn Found Most recent carotid 09/29/21 with plaque no stenosis   Seen by EP Dr Quentin Ore for PAF 08/10/21 Monitor that month with <1% PAF burden Discussed Tikosyn but did not think AAT needed at that time However 01/2022 had PAF in office Repeat monitor showed frequent long episodes of PAF Seen by EP again 03/16/22 18% PAF burden Tikosyn recommended and patient wanted to think about it She is unaware of her PAF   No resting pain has moderate claudication LLE  ROS All other systems reviewed and negative except as noted above  Past Medical History:  Diagnosis Date   Aphasia    Arthritis    CAD (coronary artery disease)    Cerebrovascular disease    CVA (cerebral vascular accident) ([place])    Depressed    Diabetes mellitus    DM2 (diabetes mellitus, type 2) ([place])    HLD (hyperlipidemia)    HTN (hypertension)    Hypercholesteremia    Hyperparathyroidism    MI (myocardial infarction) ([place])    Osteoporosis    TIA (transient ischemic attack)    UTI (urinary tract infection)     Family History  Problem Relation Age of Onset   Heart disease Mother        pacemaker   Hypertension Mother     Hyperlipidemia Mother    Diabetes Brother        pre-diabetic    Hypertension Son    Sleep apnea Son    Diabetes Other    Hypertension Other    Alcohol abuse Other    Coronary artery disease Other     Social History   Socioeconomic History   Marital status: Widowed    Spouse name: Not on file   Number of children: 1   Years of education: Not on file   Highest education level: Not on file  Occupational History   Not on file  Tobacco Use   Smoking status: Never    Passive exposure: Never   Smokeless tobacco: Never  Vaping Use   Vaping Use: Never used  Substance and Sexual Activity   Alcohol use: No   Drug use: Never   Sexual activity: Not on file  Other Topics Concern   Not on file  Social History Narrative   Not on file   Social Determinants of Health   Financial Resource Strain: Not on file  Food Insecurity: Not on file  Transportation Needs: Not on file  Physical Activity: Not on file  Stress: Not on file  Social Connections: Not on file  Intimate Partner Violence: Not on file    Past Surgical  History:  Procedure Laterality Date   ABDOMINAL AORTOGRAM W/LOWER EXTREMITY Bilateral 03/31/2021   Procedure: ABDOMINAL AORTOGRAM W/LOWER EXTREMITY;  Surgeon: Runell Gess, MD;  Location: [facility];  Service: Cardiovascular;  Laterality: Bilateral;   ABDOMINAL HYSTERECTOMY     CATARACT EXTRACTION W/PHACO Bilateral 2017   Dr. Alben Spittle   hysterectomy - unknown type        Current Outpatient Medications:    amoxicillin (AMOXIL) 500 MG capsule, Take 2,000 mg by mouth as directed. Take 4 capsules (2000 mg) by mouth 1 hour prior to dental procedures, Disp: , Rfl: 0   Ascorbic Acid (VITAMIN C) 500 MG tablet, Take 500 mg by mouth 3 (three) times a week., Disp: , Rfl:    cholecalciferol (VITAMIN D) 1000 UNITS tablet, Take 1,000 Units by mouth in the morning., Disp: , Rfl:    clopidogrel (PLAVIX) 75 MG tablet, TAKE 1 TABLET BY MOUTH  DAILY, Disp: 90 tablet, Rfl: 2    Coenzyme Q10 100 MG TABS, Take 100 mg by mouth in the morning., Disp: , Rfl:    ELIQUIS 5 MG TABS tablet, TAKE 1 TABLET BY MOUTH TWICE A DAY, Disp: 180 tablet, Rfl: 1   ezetimibe (ZETIA) 10 MG tablet, Take 1 tablet by mouth daily., Disp: , Rfl:    folic acid (FOLVITE) 1 MG tablet, Take 1 tablet by mouth daily., Disp: , Rfl:    glipiZIDE-metformin (METAGLIP) 5-500 MG tablet, Take 1 tablet by mouth 2 (two) times daily., Disp: , Rfl:    HUMIRA 40 MG/0.4ML PSKT, INJECT 40MG  SUBCUTANEOUSLY EVERY OTHER WEEK, Disp: 2 each, Rfl: 0   hydrALAZINE (APRESOLINE) 50 MG tablet, TAKE 1 TABLET BY MOUTH IN  THE MORNING AND AT BEDTIME, Disp: 180 tablet, Rfl: 3   metoprolol succinate (TOPROL-XL) 50 MG 24 hr tablet, TAKE 1 TABLET BY MOUTH  DAILY WITH OR IMMEDIATELY  FOLLOWING A MEAL, Disp: 90 tablet, Rfl: 3   neomycin-polymyxin-hydrocortisone (CORTISPORIN) 3.5-10000-1 OTIC suspension, Apply 1-2 drops daily after soaking and cover with bandaid, Disp: 10 mL, Rfl: 0   nitroGLYCERIN (NITROSTAT) 0.4 MG SL tablet, PLACE 1 TABLET UNDER THE TONGUE EVERY 5 (FIVE) MINUTES AS NEEDED FOR CHEST PAIN (3 DOSES MAX), Disp: 25 tablet, Rfl: 2   olmesartan-hydrochlorothiazide (BENICAR HCT) 40-25 MG tablet, Take 1 tablet by mouth daily., Disp: , Rfl:    potassium chloride SA (KLOR-CON M) 20 MEQ tablet, 1 tablet with food, Disp: , Rfl:    simvastatin (ZOCOR) 40 MG tablet, Take 1 tablet (40 mg total) by mouth at bedtime., Disp: 90 tablet, Rfl: 3   VITAMIN A PO, Take 3,000 Units by mouth daily., Disp: , Rfl:    vitamin E 400 UNIT capsule, Take 400 Units by mouth in the morning., Disp: , Rfl:   Current Facility-Administered Medications:    sodium chloride flush (NS) 0.9 % injection 3 mL, 3 mL, Intravenous, Q12H, 08-20-2003, MD  sodium chloride flush  3 mL Intravenous Q12H     Physical Exam: Blood pressure (!) 110/52, pulse 80, height 5\' 2"  (1.575 m), weight 196 lb (88.9 kg), SpO2 97 %.    Affect appropriate Overweight female   HEENT: normal Neck supple with no adenopathy JVP normal no bruits no thyromegaly Lungs clear with no wheezing and good diaphragmatic motion Heart:  S1/S2 no murmur, no rub, gallop or click PMI normal Abdomen: benighn, BS positve, no tenderness, no AAA no bruit.  No HSM or HJR Decreased pulses bilaterally  No edema Neuro non-focal Skin  warm and dry No muscular weakness   Labs:   Lab Results  Component Value Date   WBC 6.4 07/11/2022   HGB 13.1 07/11/2022   HCT 39.7 07/11/2022   MCV 93.2 07/11/2022   PLT 253 07/11/2022   No results for input(s): "NA", "K", "CL", "CO2", "BUN", "CREATININE", "CALCIUM", "PROT", "BILITOT", "ALKPHOS", "ALT", "AST", "GLUCOSE" in the last 168 hours.  Invalid input(s): "LABALBU" Lab Results  Component Value Date   CKTOTAL 28 06/03/2007   CKMB 0.7 06/03/2007   TROPONINI 0.02        NO INDICATION OF MYOCARDIAL INJURY. 06/03/2007    Lab Results  Component Value Date   CHOL 138 08/24/2016   CHOL  06/03/2007    116        ATP III CLASSIFICATION:  <200     mg/dL   Desirable  622-297  mg/dL   Borderline High  >=989    mg/dL   High   Lab Results  Component Value Date   HDL 40 (L) 08/24/2016   HDL 40 06/03/2007   Lab Results  Component Value Date   LDLCALC 59 08/24/2016   LDLCALC  06/03/2007    57        Total Cholesterol/HDL:CHD Risk Coronary Heart Disease Risk Table                     Men   Women  1/2 Average Risk   3.4   3.3   Lab Results  Component Value Date   TRIG 197 (H) 08/24/2016   TRIG 94 06/03/2007   Lab Results  Component Value Date   CHOLHDL 3.5 08/24/2016   CHOLHDL 2.9 06/03/2007   No results found for: "LDLDIRECT"    Radiology: No results found.  EKG: 03/15/22 SR PVC normal QT    ASSESSMENT AND PLAN:   CAD:  2008 BMS to RCA f/u Non ischemic myovue August 2022 continue medical Rx  PAF:  03/2021 in setting of PV angiogram NSR amiodarone d/c due to dizziness continue Eliquis Seen by EP March for high PAF  burden Offered Tikosyn but deferred   PVD:  Bilateral SFA dx with CTA left SFA Single vessel run off bilaterally Seen by Dr Randie Heinz 03/08/22 and recommended walking program and medical management ABI on left 0.53 03/08/22 F/U Dr Randie Heinz march 2024   DM:  Discussed low carb diet.  Target hemoglobin A1c is 6.5 or less.  Continue current medications. A1c 7.2    F/U cardiology in a year  F/U [facility] Dr Randie Heinz March 2024    Signed: Charlton Haws 08/17/2022, 10:26 AM

## 2022-08-17 ENCOUNTER — Ambulatory Visit: Payer: Medicare Other | Admitting: Cardiovascular Disease

## 2022-08-17 VITALS — BP 110/52 | HR 80 | Ht 62.0 in | Wt 196.0 lb

## 2022-08-17 DIAGNOSIS — I739 Peripheral vascular disease, unspecified: Secondary | ICD-10-CM | POA: Diagnosis not present

## 2022-08-17 DIAGNOSIS — I2510 Atherosclerotic heart disease of native coronary artery without angina pectoris: Secondary | ICD-10-CM | POA: Diagnosis not present

## 2022-08-17 DIAGNOSIS — I480 Paroxysmal atrial fibrillation: Secondary | ICD-10-CM

## 2022-08-17 DIAGNOSIS — I10 Essential (primary) hypertension: Secondary | ICD-10-CM

## 2022-08-17 NOTE — Patient Instructions (Signed)

## 2022-08-18 ENCOUNTER — Other Ambulatory Visit: Payer: Self-pay | Admitting: Physician Assistant

## 2022-08-18 NOTE — Telephone Encounter (Signed)
Next Visit: 08/23/2022   Last Visit: 03/09/2022   Last Fill: 07/10/2022 (30 day supply)   [id number] arthritis    Current Dose per office note 03/09/2022: Humira 40 mg sq injections every 14 days   Labs: 07/11/2022 CBC stable. Glucose is elevated-157. Creatinine is borderline elevated-1.09 and GFR is slightly low-55. Rest of CMP WNL.     TB Gold: 07/11/2022     Okay to refill Humira?

## 2022-08-23 ENCOUNTER — Ambulatory Visit: Payer: Medicare Other | Attending: Rheumatology | Admitting: Rheumatology

## 2022-08-23 ENCOUNTER — Encounter: Payer: Self-pay | Admitting: Rheumatology

## 2022-08-23 VITALS — BP 101/65 | HR 73 | Resp 15 | Ht 62.0 in | Wt 196.0 lb

## 2022-08-23 DIAGNOSIS — I6350 Cerebral infarction due to unspecified occlusion or stenosis of unspecified cerebral artery: Secondary | ICD-10-CM

## 2022-08-23 DIAGNOSIS — Z79899 Other long term (current) drug therapy: Secondary | ICD-10-CM

## 2022-08-23 DIAGNOSIS — M810 Age-related osteoporosis without current pathological fracture: Secondary | ICD-10-CM

## 2022-08-23 DIAGNOSIS — L408 Other psoriasis: Secondary | ICD-10-CM | POA: Diagnosis not present

## 2022-08-23 DIAGNOSIS — E785 Hyperlipidemia, unspecified: Secondary | ICD-10-CM

## 2022-08-23 DIAGNOSIS — Z8679 Personal history of other diseases of the circulatory system: Secondary | ICD-10-CM

## 2022-08-23 DIAGNOSIS — L4050 Arthropathic psoriasis, unspecified: Secondary | ICD-10-CM | POA: Diagnosis not present

## 2022-08-23 DIAGNOSIS — M170 Bilateral primary osteoarthritis of knee: Secondary | ICD-10-CM | POA: Diagnosis not present

## 2022-08-23 DIAGNOSIS — Z8673 Personal history of transient ischemic attack (TIA), and cerebral infarction without residual deficits: Secondary | ICD-10-CM

## 2022-08-23 DIAGNOSIS — Z8639 Personal history of other endocrine, nutritional and metabolic disease: Secondary | ICD-10-CM

## 2022-08-23 NOTE — Patient Instructions (Signed)
Standing Labs We placed an order today for your standing lab work.   Please have your standing labs drawn in October and every 3 months  If possible, please have your labs drawn 2 weeks prior to your appointment so that the provider can discuss your results at your appointment.  Please note that you may see your imaging and lab results in MyChart before we have reviewed them. We may be awaiting multiple results to interpret others before contacting you. Please allow our office up to 72 hours to thoroughly review all of the results before contacting the office for clarification of your results.  We currently have open lab daily: Monday through Thursday from 1:30 PM-4:30 PM and Friday from 1:30 PM- 4:00 PM If possible, please come for your lab work on Monday, Thursday or Friday afternoons, as you may experience shorter wait times.   Effective October 25, 2022 the new lab hours will change to: Monday through Thursday from 1:30 PM-5:00 PM and Friday from 8:30 AM-12:00 PM If possible, please come for your lab work on Monday and Thursday afternoons, as you may experience shorter wait times.  Please be advised, all patients with office appointments requiring lab work will take precedent over walk-in lab work.    The office is located at [address], [address], [place], [place] [address] No appointment is necessary.   Labs are drawn by [facility]. Please bring your co-pay at the time of your lab draw.  You may receive a bill from [facility] for your lab work.  Please note if you are on Hydroxychloroquine and and an order has been placed for a Hydroxychloroquine level, you will need to have it drawn 4 hours or more after your last dose.  If you wish to have your labs drawn at another location, please call the office 24 hours in advance to send orders.  If you have any questions regarding directions or hours of operation,  please call [phone].   As a reminder, please drink plenty of water prior to  coming for your lab work. Thanks!   Vaccines You are taking a medication(s) that can suppress your immune system.  The following immunizations are recommended: Flu annually Covid-19  Td/Tdap (tetanus, diphtheria, pertussis) every 10 years Pneumonia (Prevnar 15 then Pneumovax 23 at least 1 year apart.  Alternatively, can take Prevnar 20 without needing additional dose) Shingrix: 2 doses from 4 weeks to 6 months apart  Please check with your PCP to make sure you are up to date.   If you have signs or symptoms of an infection or start antibiotics: First, call your PCP for workup of your infection. Hold your medication through the infection, until you complete your antibiotics, and until symptoms resolve if you take the following: Injectable medication (Actemra, Benlysta, Cimzia, Cosentyx, Enbrel, Humira, Kevzara, Orencia, Remicade, Simponi, Stelara, Taltz, Tremfya) Methotrexate Leflunomide (Arava) Mycophenolate (Cellcept) Margaret Hobbs, Margaret Hobbs, or Margaret Hobbs  Please get an annual examination to screen for skin cancer while you are on Humira.

## 2022-09-06 ENCOUNTER — Ambulatory Visit (INDEPENDENT_AMBULATORY_CARE_PROVIDER_SITE_OTHER): Payer: Medicare Other | Admitting: Ophthalmology

## 2022-09-06 ENCOUNTER — Encounter (INDEPENDENT_AMBULATORY_CARE_PROVIDER_SITE_OTHER): Payer: Self-pay | Admitting: Ophthalmology

## 2022-09-06 DIAGNOSIS — E113492 Type 2 diabetes mellitus with severe nonproliferative diabetic retinopathy without macular edema, left eye: Secondary | ICD-10-CM

## 2022-09-06 DIAGNOSIS — E113491 Type 2 diabetes mellitus with severe nonproliferative diabetic retinopathy without macular edema, right eye: Secondary | ICD-10-CM

## 2022-09-06 NOTE — Progress Notes (Signed)
09/06/2022     CHIEF COMPLAINT Patient presents for No chief complaint on file.     HISTORY OF PRESENT ILLNESS: Margaret Hobbs is a 71 y.o. female who presents to the clinic today for:   HPI   Diabetic retinopathy of right eye  4 mths dilate ou color fp oct possible FFA Pt states her vision has been stable Pt denies any new floaters or FOL Last edited by Aleene Davidson, CMA on 09/06/2022 11:38 AM.      Referring physician: Cleatis Polka., MD [address] [place],  [place] [address]  HISTORICAL INFORMATION:   Selected notes from the MEDICAL RECORD NUMBER    Lab Results  Component Value Date   HGBA1C 7.2 (H) 04/01/2021     CURRENT MEDICATIONS: No current outpatient medications on file. (Ophthalmic Drugs)   No current facility-administered medications for this visit. (Ophthalmic Drugs)   Current Outpatient Medications (Other)  Medication Sig   amoxicillin (AMOXIL) 500 MG capsule Take 2,000 mg by mouth as directed. Take 4 capsules (2000 mg) by mouth 1 hour prior to dental procedures   Ascorbic Acid (VITAMIN C) 500 MG tablet Take 500 mg by mouth 3 (three) times a week.   cholecalciferol (VITAMIN D) 1000 UNITS tablet Take 1,000 Units by mouth in the morning.   clopidogrel (PLAVIX) 75 MG tablet TAKE 1 TABLET BY MOUTH  DAILY   Coenzyme Q10 100 MG TABS Take 100 mg by mouth in the morning.   ELIQUIS 5 MG TABS tablet TAKE 1 TABLET BY MOUTH TWICE A DAY   ezetimibe (ZETIA) 10 MG tablet Take 1 tablet by mouth daily.   folic acid (FOLVITE) 1 MG tablet Take 1 tablet by mouth daily.   glipiZIDE-metformin (METAGLIP) 5-500 MG tablet Take 1 tablet by mouth 2 (two) times daily.   glucose blood (ONETOUCH VERIO) test strip 1 strip In Vitro daily for 30 days   HUMIRA 40 MG/0.4ML PSKT INJECT 40MG  SUBCUTANEOUSLY EVERY OTHER WEEK   hydrALAZINE (APRESOLINE) 50 MG tablet TAKE 1 TABLET BY MOUTH IN  THE MORNING AND AT BEDTIME   Lancets (ONETOUCH DELICA PLUS LANCET33G) MISC Apply  topically daily.   metoprolol succinate (TOPROL-XL) 50 MG 24 hr tablet TAKE 1 TABLET BY MOUTH  DAILY WITH OR IMMEDIATELY  FOLLOWING A MEAL   neomycin-polymyxin-hydrocortisone (CORTISPORIN) 3.5-10000-1 OTIC suspension Apply 1-2 drops daily after soaking and cover with bandaid (Patient not taking: Reported on 08/23/2022)   nitroGLYCERIN (NITROSTAT) 0.4 MG SL tablet PLACE 1 TABLET UNDER THE TONGUE EVERY 5 (FIVE) MINUTES AS NEEDED FOR CHEST PAIN (3 DOSES MAX)   olmesartan-hydrochlorothiazide (BENICAR HCT) 40-25 MG tablet Take 1 tablet by mouth daily.   potassium chloride SA (KLOR-CON M) 20 MEQ tablet 1 tablet with food   simvastatin (ZOCOR) 40 MG tablet Take 1 tablet (40 mg total) by mouth at bedtime.   VITAMIN A PO Take 3,000 Units by mouth daily.   vitamin E 400 UNIT capsule Take 400 Units by mouth in the morning.   Current Facility-Administered Medications (Other)  Medication Route   sodium chloride flush (NS) 0.9 % injection 3 mL Intravenous      REVIEW OF SYSTEMS: ROS   Negative for: Constitutional, Gastrointestinal, Neurological, Skin, Genitourinary, Musculoskeletal, HENT, Endocrine, Cardiovascular, Eyes, Respiratory, Psychiatric, Allergic/Imm, Heme/Lymph Last edited by 08/25/2022, CMA on 09/06/2022 11:38 AM.       ALLERGIES Allergies  Allergen Reactions   Remicade [Infliximab] Other (See Comments)    Convulsion and felt cold  Amiodarone     Per pr report she was dizzy, weak and lethargic   Dapagliflozin     Other reaction(s): dysequilibrium, urinary frequency   Sulfa Antibiotics Rash    PAST MEDICAL HISTORY Past Medical History:  Diagnosis Date   Aphasia    Arthritis    CAD (coronary artery disease)    Cerebrovascular disease    CVA (cerebral vascular accident) (HCC)    Depressed    Diabetes mellitus    DM2 (diabetes mellitus, type 2) (HCC)    HLD (hyperlipidemia)    HTN (hypertension)    Hypercholesteremia    Hyperparathyroidism    MI (myocardial  infarction) (HCC)    Osteoporosis    TIA (transient ischemic attack)    UTI (urinary tract infection)    Past Surgical History:  Procedure Laterality Date   ABDOMINAL AORTOGRAM W/LOWER EXTREMITY Bilateral 03/31/2021   Procedure: ABDOMINAL AORTOGRAM W/LOWER EXTREMITY;  Surgeon: Runell Gess, MD;  Location: [facility];  Service: Cardiovascular;  Laterality: Bilateral;   ABDOMINAL HYSTERECTOMY     CATARACT EXTRACTION W/PHACO Bilateral 2017   Dr. Alben Spittle   hysterectomy - unknown type      FAMILY HISTORY Family History  Problem Relation Age of Onset   Heart disease Mother        pacemaker   Hypertension Mother    Hyperlipidemia Mother    Diabetes Brother        pre-diabetic    Hypertension Son    Sleep apnea Son    Diabetes Other    Hypertension Other    Alcohol abuse Other    Coronary artery disease Other     SOCIAL HISTORY Social History   Tobacco Use   Smoking status: Never    Passive exposure: Never   Smokeless tobacco: Never  Vaping Use   Vaping Use: Never used  Substance Use Topics   Alcohol use: No   Drug use: Never         OPHTHALMIC EXAM:  Base Eye Exam     Visual Acuity (ETDRS)       Right Left   Dist cc 20/20 -1 20/20 -2    Correction: Glasses         Tonometry (Tonopen, 11:43 AM)       Right Left   Pressure 7 11         Pupils       Pupils   Right PERRL   Left PERRL         Visual Fields       Left Right    Full Full         Extraocular Movement       Right Left    Ortho Ortho    -- -- --  --  --  -- -- --   -- -- --  --  --  -- -- --           Neuro/Psych     Oriented x3: Yes   Mood/Affect: Normal         Dilation     Both eyes: 1.0% Mydriacyl, 2.5% Phenylephrine @ 11:40 AM           Slit Lamp and Fundus Exam     External Exam       Right Left   External Normal Normal         Slit Lamp Exam       Right Left   Lids/Lashes Normal Normal  Conjunctiva/Sclera White and  quiet White and quiet   Cornea Clear Clear   Anterior Chamber Deep and quiet Deep and quiet   Iris Round and reactive Round and reactive   Lens Posterior chamber intraocular lens Posterior chamber intraocular lens   Anterior Vitreous Normal Normal         Fundus Exam       Right Left   Posterior Vitreous Normal Normal   Disc Normal Normal   C/D Ratio 0.45 0.55   Macula Microaneurysms, no macular thickening Microaneurysms, no macular thickening   Vessels Severe NPDR, no progression Severe NPDR, no progression   Periphery Good local PRP temporal window, Normal            IMAGING AND PROCEDURES  Imaging and Procedures for 09/06/22  OCT, Retina - OU - Both Eyes       Right Eye Quality was good. Scan locations included subfoveal. Central Foveal Thickness: 247. Progression has been stable. Findings include normal foveal contour.   Left Eye Quality was good. Scan locations included subfoveal. Central Foveal Thickness: 252. Progression has been stable. Findings include normal foveal contour.   Notes No signs of active maculopathy OU      Color Fundus Photography Optos - OU - Both Eyes       Right Eye Progression has improved. Disc findings include normal observations, increased cup to disc ratio. Macula : microaneurysms.   Left Eye Progression has improved. Disc findings include normal observations. Macula : microaneurysms.   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 The options of continued observation versus panretinal  photocoagulation at this time were reviewed as well as the risks, benefits, and alternatives. More recent option includes the use of ocular injectable medications to slow progression of retinal disease. Tight control of glucose, blood pressure, and serum lipid levels were recommended under the direction of general physician or endocrinologist, as well as avoidance of smoking and maintenance of normal body weight. The 2-year risk of progression to proliferative diabetic retinopathy is 60%.      ICD-10-CM   1. Severe nonproliferative diabetic retinopathy of right eye without macular edema associated with type 2 diabetes mellitus (HCC)  E11.3491 OCT, Retina - OU - Both Eyes    Color Fundus Photography Optos - OU - Both Eyes    2. Severe nonproliferative diabetic retinopathy of left eye without macular edema associated with type 2 diabetes mellitus (HCC)  O29.4765       1.  No signs of progression of severe NPDR OU.  We will can simply observe today.  Force angiography was contemplated for regions of retinal nonperfusion temporally.  Not required today.  2.  Patient understands critical importance of continued blood sugar control and monitoring  3.  Ophthalmic Meds Ordered this visit:  No orders of the defined types were placed in this encounter.      Return in about 6 months (around 03/07/2023) for DILATE OU, COLOR FP.  There are no Patient Instructions on file for this visit.   Explained the diagnoses,  plan, and follow up with the patient and they expressed understanding.  Patient expressed understanding of the importance of proper follow up care.   Alford Highland Teniya Filter M.D. Diseases & Surgery of the Retina and Vitreous [facility] 09/06/22     Abbreviations: M myopia (nearsighted); A astigmatism; H hyperopia (farsighted); P presbyopia; Mrx spectacle prescription;  CTL contact lenses; OD right eye; OS left eye; OU both eyes  XT exotropia; ET esotropia; PEK punctate  epithelial keratitis; PEE punctate epithelial erosions; DES dry eye syndrome; MGD meibomian gland dysfunction; ATs artificial tears; PFAT's preservative free artificial tears; NSC nuclear sclerotic cataract; PSC posterior subcapsular cataract; ERM epi-retinal membrane; PVD posterior vitreous detachment; RD retinal detachment; DM diabetes mellitus; DR diabetic retinopathy; NPDR non-proliferative diabetic retinopathy; PDR proliferative diabetic retinopathy; CSME clinically significant macular edema; DME diabetic macular edema; dbh dot blot hemorrhages; CWS cotton wool spot; POAG primary open angle glaucoma; C/D cup-to-disc ratio; HVF humphrey visual field; GVF goldmann visual field; OCT optical coherence tomography; IOP intraocular pressure; BRVO Branch retinal vein occlusion; CRVO central retinal vein occlusion; CRAO central retinal artery occlusion; BRAO branch retinal artery occlusion; RT retinal tear; SB scleral buckle; PPV pars plana vitrectomy; VH Vitreous hemorrhage; PRP panretinal laser photocoagulation; IVK intravitreal kenalog; VMT vitreomacular traction; MH Macular hole;  NVD neovascularization of the disc; NVE neovascularization elsewhere; AREDS age related eye disease study; ARMD age related macular degeneration; POAG primary open angle glaucoma; EBMD epithelial/anterior basement membrane dystrophy; ACIOL anterior chamber intraocular lens; IOL intraocular lens; PCIOL posterior chamber intraocular lens; Phaco/IOL phacoemulsification with intraocular lens placement; PRK photorefractive keratectomy; LASIK laser assisted in situ keratomileusis; HTN hypertension; DM diabetes mellitus; COPD chronic obstructive pulmonary disease

## 2022-09-06 NOTE — Assessment & Plan Note (Signed)
Stable

## 2022-09-06 NOTE — Assessment & Plan Note (Signed)
The nature of severe nonproliferative diabetic retinopathy discussed with the patient as well as the need for more frequent follow up and likely progression to proliferative disease in the near future. The options of continued observation versus panretinal photocoagulation at this time were reviewed as well as the risks, benefits, and alternatives. More recent option includes the use of ocular injectable medications to slow progression of retinal disease. Tight control of glucose, blood pressure, and serum lipid levels were recommended under the direction of general physician or endocrinologist, as well as avoidance of smoking and maintenance of normal body weight. The 2-year risk of progression to proliferative diabetic retinopathy is 60%. 

## 2022-09-29 ENCOUNTER — Encounter: Payer: Self-pay | Admitting: Podiatry

## 2022-09-29 ENCOUNTER — Ambulatory Visit: Payer: Medicare Other | Admitting: Podiatry

## 2022-09-29 DIAGNOSIS — M79674 Pain in right toe(s): Secondary | ICD-10-CM

## 2022-09-29 DIAGNOSIS — E1151 Type 2 diabetes mellitus with diabetic peripheral angiopathy without gangrene: Secondary | ICD-10-CM

## 2022-09-29 DIAGNOSIS — M79675 Pain in left toe(s): Secondary | ICD-10-CM | POA: Diagnosis not present

## 2022-09-29 DIAGNOSIS — B351 Tinea unguium: Secondary | ICD-10-CM | POA: Diagnosis not present

## 2022-10-04 NOTE — Progress Notes (Signed)
  Subjective:  Patient ID: Margaret Hobbs, female    DOB: 1951/06/11,  MRN: [id number]  Margaret Hobbs presents to clinic today for:  Chief Complaint  Patient presents with   Nail Problem    Diabetic foot care BS-did not check yet A1C-7.4 PCP-William Shaw PCP VST- 3 or 4 months ago    New problem(s): None.   PCP is Ginger Organ., MD , and last visit was July, 2023.  Allergies  Allergen Reactions   Remicade [Infliximab] Other (See Comments)    Convulsion and felt cold    Amiodarone     Per pr report she was dizzy, weak and lethargic   Dapagliflozin     Other reaction(s): dysequilibrium, urinary frequency   Sulfa Antibiotics Rash    Review of Systems: Negative except as noted in the HPI.  Objective: No changes noted in today's physical examination.  Margaret Hobbs is a pleasant 71 y.o. female in NAD. AAO x 3.  Vascular Examination: CFT immediate b/l LE. Palpable DP/PT pulses b/l LE. Digital hair sparse b/l. Skin temperature gradient WNL b/l. No pain with calf compression b/l. No edema noted b/l. No cyanosis or clubbing noted b/l LE.  Dermatological Examination: Pedal integument with normal turgor, texture and tone b/l LE. No open wounds b/l. No interdigital macerations b/l. Toenails 2-5 bilaterally and L hallux elongated, thickened, discolored with subungual debris. +Tenderness with dorsal palpation of nailplates.   Resolving hyperkeratotic lesion(s) medial IPJ of right great toe and plantar IPJ of left great toe.  No erythema, no edema, no drainage, no fluctuance.  Musculoskeletal Examination: Muscle strength 5/5 to all LE muscle groups of left lower extremity. Muscle strength 3/5 to all LE muscle groups of right lower extremity. Patient ambulates independent of any assistive aids.   Neurological Examination: Protective sensation intact 5/5 intact bilaterally with 10g monofilament b/l. Vibratory sensation intact b/l.  Assessment/Plan: 1. Pain  due to onychomycosis of toenails of both feet   2. Type II diabetes mellitus with peripheral circulatory disorder (HCC)     No orders of the defined types were placed in this encounter.   -Consent given for treatment as described below: -Examined patient. -Patient to continue soft, supportive shoe gear daily. -Toenails 1-5 b/l were debrided in length and girth with sterile nail nippers and dremel without iatrogenic bleeding.  -Patient/POA to call should there be question/concern in the interim.   Return in about 3 months (around 12/30/2022).  Marzetta Board, DPM

## 2022-10-27 ENCOUNTER — Telehealth: Payer: Self-pay | Admitting: *Deleted

## 2022-10-27 NOTE — Telephone Encounter (Signed)
Labs received from:[facility]  Drawn on:10/20/2022  Reviewed by: Dr. Bo Merino  Labs drawn: CMP, CBC, Lipid, TSH, Vitamin D, A1C, CBG  Results: Glucose        151                BUN               26                RBC                 3.9                Hct                 34.2                MCH              32.8                RDW              11.0                MPV               12.6                Triglycerides 154

## 2022-11-01 ENCOUNTER — Other Ambulatory Visit: Payer: Self-pay | Admitting: Rheumatology

## 2022-11-01 NOTE — Telephone Encounter (Signed)
Next Visit: 01/24/2023  Last Visit: 08/23/2022  Last Fill: 08/18/2022  DX: Psoriatic arthritis   Current Dose per office note 08/23/2022: Humira 40 mg sq injections every 14 days   Labs: 10/20/2022  Glucose        151                BUN               26                RBC                 3.9                Hct                 34.2                MCH              32.8                RDW              11.0                MPV               12.6                Triglycerides 154  TB Gold: 07/11/2022 Neg    Okay to refill Humira?

## 2023-01-11 ENCOUNTER — Other Ambulatory Visit: Payer: Self-pay | Admitting: Cardiovascular Disease

## 2023-01-11 DIAGNOSIS — I10 Essential (primary) hypertension: Secondary | ICD-10-CM

## 2023-01-11 NOTE — Progress Notes (Deleted)
Office Visit Note  Patient: Margaret Hobbs             Date of Birth: February 01, 1951           MRN: [id number]             PCP: Ginger Organ., MD Referring: Ginger Organ., MD Visit Date: 01/24/2023 Occupation: '@GUAROCC'$ @  Subjective:    History of Present Illness: Margaret Hobbs is a 72 y.o. female with history of psoriatic arthritis and osteoporosis.  She remains on Humira 40 mg sq injections every 14 days.  She continues to tolerate Humira without any side effects or injection site reactions.  TB gold negative on 07/11/22.  CBC and CMP updated on 01/16/23.  Her next lab work will be due in April and every 3 months.  Discussed the importance of holding humira if she develops signs or symptoms of an infection and to resume once the infection has completely cleared. Discussed the importance of yearly skin cancer screening while on Humira.  Activities of Daily Living:  Patient reports morning stiffness for *** {minute/hour:19697}.   Patient {ACTIONS;DENIES/REPORTS:21021675::"Denies"} nocturnal pain.  Difficulty dressing/grooming: {ACTIONS;DENIES/REPORTS:21021675::"Denies"} Difficulty climbing stairs: {ACTIONS;DENIES/REPORTS:21021675::"Denies"} Difficulty getting out of chair: {ACTIONS;DENIES/REPORTS:21021675::"Denies"} Difficulty using hands for taps, buttons, cutlery, and/or writing: {ACTIONS;DENIES/REPORTS:21021675::"Denies"}  No Rheumatology ROS completed.   PMFS History:  Patient Active Problem List   Diagnosis Date Noted   Posterior vitreous detachment of both eyes 05/04/2022   Other specified counseling 03/10/2022   Epistaxis 11/29/2021   Hypercoagulable state ([place]) 11/29/2021   Morbid obesity ([place]) 11/29/2021   Pseudophakia of both eyes 04/28/2021   Claudication in peripheral vascular disease ([place]) 03/31/2021   Chronic kidney disease due to hypertension 05/18/2020   Severe nonproliferative diabetic retinopathy of right eye ([place]) 05/06/2020   Round  hole of right eye 05/06/2020   Severe nonproliferative diabetic retinopathy of left eye ([place]) 05/06/2020   Retinal hemorrhage of left eye 05/06/2020   Peripheral arterial disease ([place]) 10/17/2019   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   Carotid artery disease ([place]) 01/24/2017   Primary osteoarthritis of both knees 11/29/2016   Other psoriasis 11/28/2016   Psoriatic arthritis ([place]) 11/28/2016   High risk medication use 11/28/2016   Chronic kidney disease, stage 3a ([place]) 11/25/2012   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   Carotid artery occlusion 10/19/2009   Age-related osteoporosis without current pathological fracture 10/19/2009   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   Type 2 diabetes mellitus ([place]) 03/30/2009   Hyperlipidemia 03/30/2009   Dyslipidemia 03/30/2009   Essential hypertension 03/30/2009   History of MI (myocardial infarction) 03/30/2009   Coronary artery disease involving native coronary artery of native heart without angina pectoris 03/30/2009   Cerebral artery occlusion with cerebral infarction ([place]) 03/30/2009   URINARY TRACT INFECTION 03/30/2009   ARTHRITIS 03/30/2009   HYPERPARATHYROIDISM, HX OF 03/30/2009    Past Medical History:  Diagnosis Date   Aphasia    Arthritis    CAD (coronary artery disease)    Cerebrovascular disease    CVA (cerebral vascular accident) ([place])    Depressed    Diabetes mellitus    DM2 (diabetes mellitus, type 2) ([place])    HLD (hyperlipidemia)    HTN (hypertension)    Hypercholesteremia    Hyperparathyroidism    MI (myocardial infarction) ([place])    Osteoporosis    TIA (transient ischemic attack)    UTI (urinary tract infection)  Family History  Problem Relation Age of Onset   Heart disease Mother        pacemaker   Hypertension Mother    Hyperlipidemia Mother    Diabetes Brother        pre-diabetic    Hypertension Son    Sleep apnea  Son    Diabetes Other    Hypertension Other    Alcohol abuse Other    Coronary artery disease Other    Past Surgical History:  Procedure Laterality Date   ABDOMINAL AORTOGRAM W/LOWER EXTREMITY Bilateral 03/31/2021   Procedure: ABDOMINAL AORTOGRAM W/LOWER EXTREMITY;  Surgeon: Lorretta Harp, MD;  Location: [facility];  Service: Cardiovascular;  Laterality: Bilateral;   ABDOMINAL HYSTERECTOMY     CATARACT EXTRACTION W/PHACO Bilateral 2017   Dr. Kathlen Mody   hysterectomy - unknown type     Social History   Social History Narrative   Not on file   Immunization History  Administered Date(s) Administered   PFIZER(Purple Top)SARS-COV-2 Vaccination 02/16/2020, 03/08/2020, 11/05/2020     Objective: Vital Signs: There were no vitals taken for this visit.   Physical Exam Vitals and nursing note reviewed.  Constitutional:      Appearance: She is well-developed.  HENT:     Head: Normocephalic and atraumatic.  Eyes:     Conjunctiva/sclera: Conjunctivae normal.  Cardiovascular:     Rate and Rhythm: Normal rate and regular rhythm.     Heart sounds: Normal heart sounds.  Pulmonary:     Effort: Pulmonary effort is normal.     Breath sounds: Normal breath sounds.  Abdominal:     General: Bowel sounds are normal.     Palpations: Abdomen is soft.  Musculoskeletal:     Cervical back: Normal range of motion.  Skin:    General: Skin is warm and dry.     Capillary Refill: Capillary refill takes less than 2 seconds.  Neurological:     Mental Status: She is alert and oriented to person, place, and time.  Psychiatric:        Behavior: Behavior normal.      Musculoskeletal Exam: C-spine has limited range of motion with rotation.  Postural thoracic kyphosis noted.  Shoulder joints, elbow joints, wrist joints, MCPs, PIPs, DIPs have good range of motion with no synovitis.  PIP and DIP thickening consistent with osteoarthritis of both hands.  Hip joints have good range of motion with no  groin pain.  Knee joints have good range of motion with no warmth or effusion.  Ankle joints have good range of motion with no tenderness or joint swelling.  CDAI Exam: CDAI Score: -- Patient Global: --; Provider Global: -- Swollen: --; Tender: -- Joint Exam 01/24/2023   No joint exam has been documented for this visit   There is currently no information documented on the homunculus. Go to the Rheumatology activity and complete the homunculus joint exam.  Investigation: No additional findings.  Imaging: No results found.  Recent Labs: Lab Results  Component Value Date   WBC 6.4 07/11/2022   HGB 13.1 07/11/2022   PLT 253 07/11/2022   NA 141 07/11/2022   K 3.6 07/11/2022   CL 100 07/11/2022   CO2 31 07/11/2022   GLUCOSE 157 (H) 07/11/2022   BUN 19 07/11/2022   CREATININE 1.09 (H) 07/11/2022   BILITOT 0.4 07/11/2022   ALKPHOS 37 (L) 09/17/2021   AST 20 07/11/2022   ALT 19 07/11/2022   PROT 7.0 07/11/2022   ALBUMIN 3.4 (  L) 09/17/2021   CALCIUM 9.8 07/11/2022   GFRAA 69 07/01/2019   QFTBGOLDPLUS NEGATIVE 07/11/2022    Speciality Comments: No specialty comments available.  Procedures:  No procedures performed Allergies: Remicade [infliximab], Amiodarone, Dapagliflozin, and Sulfa antibiotics   Assessment / Plan:     Visit Diagnoses: Psoriatic arthritis ([place])  Other psoriasis  High risk medication use - Humira 40 mg sq injections every 14 days.  Primary osteoarthritis of both knees  Age-related osteoporosis without current pathological fracture - Reclast is currently on hold-previously prescribed by Dr. Brigitte Pulse.  History of stroke  History of diabetes mellitus  History of atrial fibrillation - History of PAD with symptomatic left calf claudication s/p angiography.  She is followed by Dr. Alvester Chou.  Dyslipidemia  History of hypertension  History of hypercholesterolemia  History of coronary artery disease  Cerebral artery occlusion with cerebral infarction  [facility])  Orders: No orders of the defined types were placed in this encounter.  No orders of the defined types were placed in this encounter.     Follow-Up Instructions: No follow-ups on file.   Ofilia Neas, PA-C  Note - This record has been created using Dragon software.  Chart creation errors have been sought, but may not always  have been located. Such creation errors do not reflect on  the standard of medical care.

## 2023-01-16 ENCOUNTER — Other Ambulatory Visit: Payer: Self-pay | Admitting: Physician Assistant

## 2023-01-16 NOTE — Telephone Encounter (Signed)
Next Visit: 01/24/2023   Last Visit: 08/23/2022   Last Fill: 11/01/2022   DX: Psoriatic arthritis    Current Dose per office note 08/23/2022: Humira 40 mg sq injections every 14 days    Labs: 10/20/2022  Glucose        151                BUN               26                RBC                 3.9                Hct                 34.2                MCH              32.8                RDW              11.0                MPV               12.6                Triglycerides 154   TB Gold: 07/11/2022 Neg    Patient to update labs at upcoming appointment on 01/24/2023   Okay to refill Humira?

## 2023-01-19 ENCOUNTER — Ambulatory Visit: Payer: Medicare Other | Admitting: Podiatry

## 2023-01-19 ENCOUNTER — Encounter: Payer: Self-pay | Admitting: Podiatry

## 2023-01-19 VITALS — BP 123/55

## 2023-01-19 DIAGNOSIS — M79675 Pain in left toe(s): Secondary | ICD-10-CM | POA: Diagnosis not present

## 2023-01-19 DIAGNOSIS — M79674 Pain in right toe(s): Secondary | ICD-10-CM

## 2023-01-19 DIAGNOSIS — B351 Tinea unguium: Secondary | ICD-10-CM | POA: Diagnosis not present

## 2023-01-19 DIAGNOSIS — E119 Type 2 diabetes mellitus without complications: Secondary | ICD-10-CM

## 2023-01-19 DIAGNOSIS — E1151 Type 2 diabetes mellitus with diabetic peripheral angiopathy without gangrene: Secondary | ICD-10-CM

## 2023-01-19 NOTE — Progress Notes (Unsigned)
ANNUAL DIABETIC FOOT EXAM  Subjective: Margaret Hobbs presents today {jgcomplaint:23593}.  Chief Complaint  Patient presents with   Nail Problem    D[facility] BS-do not check A1C-7.0 PCP-Shaw,William PCP VST-09/2022    Patient confirms h/o diabetes.  Patient relates {Numbers; 0-100:15068} year h/o diabetes.  Patient denies any h/o foot wounds.  Patient has h/o foot ulcer of {jgPodToeLocator:23637}, which healed via help of ***.  Patient has h/o amputation(s): {jgamp:23617}.  Patient endorses symptoms of foot numbness.   Patient endorses symptoms of foot tingling.  Patient endorses symptoms of burning in feet.  Patient endorses symptoms of pins/needles sensation in feet.  Patient denies any numbness, tingling, burning, or pins/needle sensation in feet.  Patient has been diagnosed with neuropathy and it is managed with {JGNEUROPATHYMEDS:27053}.  Risk factors: {jgriskfactors:24044}.  Margaret Organ., MD is patient's PCP. Last visit was {Time; dates multiple:15870}***.  Past Medical History:  Diagnosis Date   Aphasia    Arthritis    CAD (coronary artery disease)    Cerebrovascular disease    CVA (cerebral vascular accident) ([place])    Depressed    Diabetes mellitus    DM2 (diabetes mellitus, type 2) ([place])    HLD (hyperlipidemia)    HTN (hypertension)    Hypercholesteremia    Hyperparathyroidism    MI (myocardial infarction) ([place])    Osteoporosis    TIA (transient ischemic attack)    UTI (urinary tract infection)    Patient Active Problem List   Diagnosis Date Noted   Posterior vitreous detachment of both eyes 05/04/2022   Other specified counseling 03/10/2022   Epistaxis 11/29/2021   Hypercoagulable state ([place]) 11/29/2021   Morbid obesity ([place]) 11/29/2021   Pseudophakia of both eyes 04/28/2021   Claudication in peripheral vascular disease ([place]) 03/31/2021   Chronic kidney disease due to hypertension 05/18/2020   Severe nonproliferative diabetic  retinopathy of right eye ([place]) 05/06/2020   Round hole of right eye 05/06/2020   Severe nonproliferative diabetic retinopathy of left eye ([place]) 05/06/2020   Retinal hemorrhage of left eye 05/06/2020   Peripheral arterial disease ([place]) 10/17/2019   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   Carotid artery disease ([place]) 01/24/2017   Primary osteoarthritis of both knees 11/29/2016   Other psoriasis 11/28/2016   Psoriatic arthritis ([place]) 11/28/2016   High risk medication use 11/28/2016   Chronic kidney disease, stage 3a ([place]) 11/25/2012   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[place]) 10/29/2009   Carotid artery occlusion 10/19/2009   Age-related osteoporosis without current pathological fracture 10/19/2009   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   Type 2 diabetes mellitus ([place]) 03/30/2009   Hyperlipidemia 03/30/2009   Dyslipidemia 03/30/2009   Essential hypertension 03/30/2009   History of MI (myocardial infarction) 03/30/2009   Coronary artery disease involving native coronary artery of native heart without angina pectoris 03/30/2009   Cerebral artery occlusion with cerebral infarction ([place]) 03/30/2009   URINARY TRACT INFECTION 03/30/2009   ARTHRITIS 03/30/2009   HYPERPARATHYROIDISM, HX OF 03/30/2009   Past Surgical History:  Procedure Laterality Date   ABDOMINAL AORTOGRAM W/LOWER EXTREMITY Bilateral 03/31/2021   Procedure: ABDOMINAL AORTOGRAM W/LOWER EXTREMITY;  Surgeon: Lorretta Harp, MD;  Location: [facility];  Service: Cardiovascular;  Laterality: Bilateral;   ABDOMINAL HYSTERECTOMY     CATARACT EXTRACTION W/PHACO Bilateral 2017   Dr. Kathlen Mody   hysterectomy - unknown type     Current Outpatient Medications on File Prior to Visit  Medication Sig Dispense  Refill   amoxicillin (AMOXIL) 500 MG capsule Take 2,000 mg by mouth as directed. Take 4 capsules (2000 mg) by mouth 1 hour prior to dental procedures  0    Ascorbic Acid (VITAMIN C) 500 MG tablet Take 500 mg by mouth 3 (three) times a week.     cholecalciferol (VITAMIN D) 1000 UNITS tablet Take 1,000 Units by mouth in the morning.     clopidogrel (PLAVIX) 75 MG tablet TAKE 1 TABLET BY MOUTH  DAILY 90 tablet 2   Coenzyme Q10 100 MG TABS Take 100 mg by mouth in the morning.     ELIQUIS 5 MG TABS tablet TAKE 1 TABLET BY MOUTH TWICE A DAY 180 tablet 1   ezetimibe (ZETIA) 10 MG tablet Take 1 tablet by mouth daily.     folic acid (FOLVITE) 1 MG tablet Take 1 tablet by mouth daily.     glipiZIDE-metformin (METAGLIP) 5-500 MG tablet Take 1 tablet by mouth 2 (two) times daily.     glucose blood (ONETOUCH VERIO) test strip 1 strip In Vitro daily for 30 days     HUMIRA, 2 SYRINGE, 40 MG/0.4ML PSKT INJECT 40MG  SUBCUTANEOUSLY EVERY OTHER WEEK 2 each 0   hydrALAZINE (APRESOLINE) 50 MG tablet TAKE 1 TABLET BY MOUTH IN THE  MORNING AND AT BEDTIME 180 tablet 2   Lancets (ONETOUCH DELICA PLUS [id number]) MISC Apply topically daily.     metoprolol succinate (TOPROL-XL) 50 MG 24 hr tablet TAKE 1 TABLET BY MOUTH DAILY  WITH OR IMMEDIATELY FOLLOWING A  MEAL 90 tablet 2   neomycin-polymyxin-hydrocortisone (CORTISPORIN) 3.5-10000-1 OTIC suspension Apply 1-2 drops daily after soaking and cover with bandaid (Patient not taking: Reported on 08/23/2022) 10 mL 0   nitroGLYCERIN (NITROSTAT) 0.4 MG SL tablet PLACE 1 TABLET UNDER THE TONGUE EVERY 5 (FIVE) MINUTES AS NEEDED FOR CHEST PAIN (3 DOSES MAX) 25 tablet 2   olmesartan-hydrochlorothiazide (BENICAR HCT) 40-25 MG tablet Take 1 tablet by mouth daily.     potassium chloride SA (KLOR-CON M) 20 MEQ tablet 1 tablet with food     simvastatin (ZOCOR) 40 MG tablet Take 1 tablet (40 mg total) by mouth at bedtime. 90 tablet 3   VITAMIN A PO Take 3,000 Units by mouth daily.     vitamin E 400 UNIT capsule Take 400 Units by mouth in the morning.     Current Facility-Administered Medications on File Prior to Visit  Medication Dose Route  Frequency Provider Last Rate Last Admin   sodium chloride flush (NS) 0.9 % injection 3 mL  3 mL Intravenous Q12H Lorretta Harp, MD        Allergies  Allergen Reactions   Remicade [Infliximab] Other (See Comments)    Convulsion and felt cold    Amiodarone     Per pr report she was dizzy, weak and lethargic   Dapagliflozin     Other reaction(s): dysequilibrium, urinary frequency   Sulfa Antibiotics Rash   Social History   Occupational History   Not on file  Tobacco Use   Smoking status: Never    Passive exposure: Never   Smokeless tobacco: Never  Vaping Use   Vaping Use: Never used  Substance and Sexual Activity   Alcohol use: No   Drug use: Never   Sexual activity: Not on file   Family History  Problem Relation Age of Onset   Heart disease Mother        pacemaker   Hypertension Mother    Hyperlipidemia Mother  Diabetes Brother        pre-diabetic    Hypertension Son    Sleep apnea Son    Diabetes Other    Hypertension Other    Alcohol abuse Other    Coronary artery disease Other    Immunization History  Administered Date(s) Administered   PFIZER(Purple Top)SARS-COV-2 Vaccination 02/16/2020, 03/08/2020, 11/05/2020     Review of Systems: Negative except as noted in the HPI.   Objective: Vitals:   01/19/23 1049  BP: (!) 123/55    Cydni Pair is a pleasant 72 y.o. female in NAD. AAO X 3.  Vascular Examination: {jgvascular:23595}  Dermatological Examination: {jgderm:23598}  Neurological Examination: {jgneuro:23601::"Protective sensation intact 5/5 intact bilaterally with 10g monofilament b/l.","Vibratory sensation intact b/l.","Proprioception intact bilaterally."}  Musculoskeletal Examination: {jgmsk:23600}  Footwear Assessment: Does the patient wear appropriate shoes? {Yes,No}. Does the patient need inserts/orthotics? {Yes,No}.  ADA Risk Categorization: Low Risk :  Patient has all of the following: Intact protective sensation No  prior foot ulcer  No severe deformity Pedal pulses present  High Risk  Patient has one or more of the following: Loss of protective sensation Absent pedal pulses Severe Foot deformity History of foot ulcer  Assessment: No diagnosis found.   Plan: No orders of the defined types were placed in this encounter.   No orders of the defined types were placed in this encounter.   None  {jgplan:23602::"-Patient/POA to call should there be question/concern in the interim."} Return in about 3 months (around 04/20/2023).  Freddie Breech, DPM

## 2023-01-22 ENCOUNTER — Telehealth: Payer: Self-pay

## 2023-01-22 NOTE — Telephone Encounter (Signed)
Labs received from: [facility]  Drawn on: 01/16/2023   Reviewed by: Dr. Bo Merino  Labs drawn:  CMP CBC Vitamin D    Results: Glucose 205 Alk phos 35 BASO% 2.6 RDW 10.6

## 2023-01-24 ENCOUNTER — Ambulatory Visit: Payer: Medicare Other | Attending: Physician Assistant | Admitting: Physician Assistant

## 2023-01-24 DIAGNOSIS — L408 Other psoriasis: Secondary | ICD-10-CM

## 2023-01-24 DIAGNOSIS — L4050 Arthropathic psoriasis, unspecified: Secondary | ICD-10-CM

## 2023-01-24 DIAGNOSIS — Z8673 Personal history of transient ischemic attack (TIA), and cerebral infarction without residual deficits: Secondary | ICD-10-CM

## 2023-01-24 DIAGNOSIS — Z8639 Personal history of other endocrine, nutritional and metabolic disease: Secondary | ICD-10-CM

## 2023-01-24 DIAGNOSIS — E785 Hyperlipidemia, unspecified: Secondary | ICD-10-CM

## 2023-01-24 DIAGNOSIS — Z8679 Personal history of other diseases of the circulatory system: Secondary | ICD-10-CM

## 2023-01-24 DIAGNOSIS — Z79899 Other long term (current) drug therapy: Secondary | ICD-10-CM

## 2023-01-24 DIAGNOSIS — M170 Bilateral primary osteoarthritis of knee: Secondary | ICD-10-CM

## 2023-01-24 DIAGNOSIS — M810 Age-related osteoporosis without current pathological fracture: Secondary | ICD-10-CM

## 2023-01-24 DIAGNOSIS — I6350 Cerebral infarction due to unspecified occlusion or stenosis of unspecified cerebral artery: Secondary | ICD-10-CM

## 2023-02-14 ENCOUNTER — Other Ambulatory Visit: Payer: Self-pay | Admitting: Cardiovascular Disease

## 2023-02-14 ENCOUNTER — Other Ambulatory Visit: Payer: Self-pay | Admitting: Physician Assistant

## 2023-02-15 NOTE — Progress Notes (Unsigned)
Office Visit Note  Patient: Margaret Hobbs             Date of Birth: 20-Sep-1951           MRN: [id number]             PCP: Ginger Organ., MD Referring: Ginger Organ., MD Visit Date: 02/20/2023 Occupation: '@GUAROCC'$ @  Subjective:  Medication monitoring   History of Present Illness: Margaret Hobbs is a 72 y.o. female with history of psoriatic arthritis, osteoarthritis, and osteoporosis. Patient remains on humira 40 mg sq injections every 14 days.  She continues to tolerate Humira without any side effects or injection site reactions.  She has not missed any doses recently.  She experiences intermittent sharp shooting pains in the right second MCP joint.  She states that the symptoms are typically fleeting.  She has not noticed any joint swelling.  Patient reports that her rheumatoid arthritis remains well-controlled on Humira as prescribed.  She states that she is having some psoriasis behind the left ear and uses topical steroid creams which are helpful.  She denies any new patches of psoriasis at this time.  She has not been evaluated by dermatology recently.  She denies any recent or recurrent infections.  She denies any new medical conditions.    Activities of Daily Living:  Patient reports morning stiffness for 0 minutes.   Patient Denies nocturnal pain.  Difficulty dressing/grooming: Reports Difficulty climbing stairs: Reports Difficulty getting out of chair: Denies Difficulty using hands for taps, buttons, cutlery, and/or writing: Reports  Review of Systems  Constitutional: Negative.  Negative for fatigue.  HENT: Negative.  Negative for mouth sores and mouth dryness.   Eyes: Negative.  Negative for dryness.  Respiratory: Negative.  Negative for shortness of breath.   Cardiovascular: Negative.  Negative for chest pain and palpitations.  Gastrointestinal: Negative.  Negative for blood in stool, constipation and diarrhea.  Endocrine: Negative.  Negative for  increased urination.  Genitourinary: Negative.  Negative for involuntary urination.  Musculoskeletal:  Negative for joint pain, gait problem, joint pain, joint swelling, myalgias, muscle weakness, morning stiffness, muscle tenderness and myalgias.  Skin:  Positive for hair loss. Negative for color change, rash and sensitivity to sunlight.  Allergic/Immunologic: Negative.  Negative for susceptible to infections.  Neurological: Negative.  Negative for dizziness and headaches.  Hematological: Negative.  Negative for swollen glands.  Psychiatric/Behavioral: Negative.  Negative for depressed mood and sleep disturbance. The patient is not nervous/anxious.     PMFS History:  Patient Active Problem List   Diagnosis Date Noted   Posterior vitreous detachment of both eyes 05/04/2022   Other specified counseling 03/10/2022   Epistaxis 11/29/2021   Hypercoagulable state ([place]) 11/29/2021   Morbid obesity ([place]) 11/29/2021   Pseudophakia of both eyes 04/28/2021   Claudication in peripheral vascular disease ([place]) 03/31/2021   Chronic kidney disease due to hypertension 05/18/2020   Severe nonproliferative diabetic retinopathy of right eye ([place]) 05/06/2020   Round hole of right eye 05/06/2020   Severe nonproliferative diabetic retinopathy of left eye ([place]) 05/06/2020   Retinal hemorrhage of left eye 05/06/2020   Peripheral arterial disease ([place]) 10/17/2019   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[place]) 09/17/2017   Carotid artery disease ([place]) 01/24/2017   Primary osteoarthritis of both knees 11/29/2016   Other psoriasis 11/28/2016   Psoriatic arthritis ([place]) 11/28/2016   High risk medication use 11/28/2016   Chronic kidney disease, stage 3a ([place]) 11/25/2012   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   Hemiplegia  of nondominant side as late effect of cerebrovascular disease ([place]) 10/29/2009   Carotid artery occlusion 10/19/2009   Age-related osteoporosis without current pathological fracture 10/19/2009   Diabetic  oculopathy ([place]) 10/19/2009   Type 2 diabetes mellitus ([place]) 03/30/2009   Hyperlipidemia 03/30/2009   Dyslipidemia 03/30/2009   Essential hypertension 03/30/2009   History of MI (myocardial infarction) 03/30/2009   Coronary artery disease involving native coronary artery of native heart without angina pectoris 03/30/2009   Cerebral artery occlusion with cerebral infarction ([place]) 03/30/2009   URINARY TRACT INFECTION 03/30/2009   ARTHRITIS 03/30/2009   HYPERPARATHYROIDISM, HX OF 03/30/2009    Past Medical History:  Diagnosis Date   Aphasia    Arthritis    CAD (coronary artery disease)    Cerebrovascular disease    CVA (cerebral vascular accident) ([place])    Depressed    Diabetes mellitus    DM2 (diabetes mellitus, type 2) ([place])    HLD (hyperlipidemia)    HTN (hypertension)    Hypercholesteremia    Hyperparathyroidism    MI (myocardial infarction) ( [place])    Osteoporosis    TIA (transient ischemic attack)    UTI (urinary tract infection)     Family History  Problem Relation Age of Onset   Heart disease Mother        pacemaker   Hypertension Mother    Hyperlipidemia Mother    Diabetes Brother        pre-diabetic    Hypertension Son    Sleep apnea Son    Diabetes Other    Hypertension Other    Alcohol abuse Other    Coronary artery disease Other    Past Surgical History:  Procedure Laterality Date   ABDOMINAL AORTOGRAM W/LOWER EXTREMITY Bilateral 03/31/2021   Procedure: ABDOMINAL AORTOGRAM W/LOWER EXTREMITY;  Surgeon: Lorretta Harp, MD;  Location: [facility];  Service: Cardiovascular;  Laterality: Bilateral;   ABDOMINAL HYSTERECTOMY     CATARACT EXTRACTION W/PHACO Bilateral 2017   Dr. Kathlen Mody   hysterectomy - unknown type     Social History   Social History Narrative   Not on file   Immunization History  Administered Date(s) Administered   PFIZER(Purple Top)SARS-COV-2 Vaccination 02/16/2020, 03/08/2020, 11/05/2020     Objective: Vital Signs: BP 132/65  (BP Location: Left Arm, Patient Position: Sitting, Cuff Size: Large)   Pulse 70   Resp 14   Ht '5\' 2"'$  (1.575 m)   Wt 201 lb 12.8 oz (91.5 kg)   BMI 36.91 kg/m    Physical Exam Vitals and nursing note reviewed.  Constitutional:      Appearance: She is well-developed.  HENT:     Head: Normocephalic and atraumatic.  Eyes:     Conjunctiva/sclera: Conjunctivae normal.  Cardiovascular:     Rate and Rhythm: Rhythm irregular.     Heart sounds: Normal heart sounds.  Pulmonary:     Effort: Pulmonary effort is normal.     Breath sounds: Normal breath sounds.  Abdominal:     General: Bowel sounds are normal.     Palpations: Abdomen is soft.  Musculoskeletal:     Cervical back: Normal range of motion.  Skin:    General: Skin is warm and dry.     Capillary Refill: Capillary refill takes less than 2 seconds.  Neurological:     Mental Status: She is alert and oriented to person, place, and time.  Psychiatric:        Behavior: Behavior normal.      Musculoskeletal  Exam: C-spine has limited range of motion with lateral rotation.  Limited range of motion of the shoulder joints.  Elbow joints, wrist joints, MCPs, PIPs, DIPs have good range of motion with no synovitis.  Tenderness over the right second MCP joint.  Complete fist formation bilaterally.  Hip joints have good range of motion with no groin pain.  Knee joints have good range of motion with no warmth or effusion.  Ankle joints have good range of motion with no tenderness or joint swelling.  No tenderness or synovitis over MTP joints.  No evidence of Achilles tendinitis or plantar fasciitis.  CDAI Exam: CDAI Score: 1.6  Patient Global: 3 mm; Provider Global: 3 mm Swollen: 0 ; Tender: 1  Joint Exam 02/20/2023      Right  Left  MCP 2   Tender        Investigation: No additional findings.  Imaging: No results found.  Recent Labs: Lab Results  Component Value Date   WBC 6.4 07/11/2022   HGB 13.1 07/11/2022   PLT 253  07/11/2022   NA 141 07/11/2022   K 3.6 07/11/2022   CL 100 07/11/2022   CO2 31 07/11/2022   GLUCOSE 157 (H) 07/11/2022   BUN 19 07/11/2022   CREATININE 1.09 (H) 07/11/2022   BILITOT 0.4 07/11/2022   ALKPHOS 37 (L) 09/17/2021   AST 20 07/11/2022   ALT 19 07/11/2022   PROT 7.0 07/11/2022   ALBUMIN 3.4 (L) 09/17/2021   CALCIUM 9.8 07/11/2022   GFRAA 69 07/01/2019   QFTBGOLDPLUS NEGATIVE 07/11/2022    Speciality Comments: No specialty comments available.  Procedures:  No procedures performed Allergies: Remicade [infliximab], Amiodarone, Dapagliflozin, and Sulfa antibiotics   Assessment / Plan:     Visit Diagnoses: Psoriatic arthritis ([place]): No synovitis or dactylitis noted on examination today.  She has some tenderness over the right second MCP joint but no inflammation was noted.  No evidence of Achilles tendinitis or plantar fasciitis.  No SI joint tenderness upon palpation.  She has some scaling and itching behind the left ear consistent with psoriasis but has not noticed any other new patches.  Overall her psoriatic arthritis remains well-controlled on Humira 40 mg subcu injections every 14 days.  She continues to tolerate Humira without any side effects or injection site reactions.  She has not had any recent or recurrent infections.  Discussed the importance of yearly skin examinations while on Humira.  Also discussed the importance of lab work every 3 months.  She remains compliant having lab work with her PCP.  Her next lab work will be due in April 2024.  She will remain on humira as prescribed.   She will follow-up in the office in 5 months or sooner if needed.  Other psoriasis: She has some scaling and itching behind the left ear.  Patient declined a prescription for a topical agent at this time.   High risk medication use - Humira 40 mg sq injections every 14 days.  CBC and CMP updated on 01/16/23. Her next lab work will be due in April and every 3 months.  TB gold negative on  07/11/22.  No recent or recurrent infections.  Discussed the importance of holding humira if she develops signs or symptoms of an infection and to resume once the infection has completely cleared.  Discussed the importance of yearly skin cancer screening while on Humira due to the increased risk for nonmelanoma skin cancers.  Primary osteoarthritis of both knees: She has good  range of motion of both knee joints on examination today.  No warmth or effusion noted.  Age-related osteoporosis without current pathological fracture: Followed by Dr. Brigitte Pulse.  Taking vitamin D 1000 units twice daily.   Other medial conditions are listed as follows:   History of stroke  History of atrial fibrillation - History of PAD with symptomatic left calf claudication s/p angiography.  She is followed by Dr. Alvester Chou.  History of diabetes mellitus  Dyslipidemia  History of hypercholesterolemia  History of hypertension: BP was 132/65 today in the office.   History of coronary artery disease  Cerebral artery occlusion with cerebral infarction ([place])  Orders: No orders of the defined types were placed in this encounter.  No orders of the defined types were placed in this encounter.    Follow-Up Instructions: Return in about 5 months (around 07/21/2023) for Rheumatoid arthritis, Osteoarthritis, Osteoporosis.   Ofilia Neas, PA-C  Note - This record has been created using Dragon software.  Chart creation errors have been sought, but may not always  have been located. Such creation errors do not reflect on  the standard of medical care.

## 2023-02-15 NOTE — Telephone Encounter (Signed)
Next Visit:  Due January 2024. Message sent to the front to schedule.   Last Visit: 08/23/2022  Last Fill: 01/16/2023 (30 day supply)  [id number] arthritis (HCC)-she denies any increased joint pain or joint swelling   Current Dose per office note 08/23/2022: Humira 40 mg sq injections every 14 days   Labs: 01/16/2023 Glucose 205 Alk phos 35 BASO% 2.6 RDW 10.6  TB Gold: 07/11/2022 Negative  Okay to refill Humira?

## 2023-02-15 NOTE — Telephone Encounter (Signed)
Please schedule patient a follow up visit. Patient was due January 2024. Thanks!

## 2023-02-20 ENCOUNTER — Encounter: Payer: Self-pay | Admitting: Physician Assistant

## 2023-02-20 ENCOUNTER — Ambulatory Visit: Payer: Medicare Other | Attending: Physician Assistant | Admitting: Physician Assistant

## 2023-02-20 VITALS — BP 132/65 | HR 70 | Resp 14 | Ht 62.0 in | Wt 201.8 lb

## 2023-02-20 DIAGNOSIS — E785 Hyperlipidemia, unspecified: Secondary | ICD-10-CM

## 2023-02-20 DIAGNOSIS — M170 Bilateral primary osteoarthritis of knee: Secondary | ICD-10-CM

## 2023-02-20 DIAGNOSIS — I6350 Cerebral infarction due to unspecified occlusion or stenosis of unspecified cerebral artery: Secondary | ICD-10-CM

## 2023-02-20 DIAGNOSIS — Z79899 Other long term (current) drug therapy: Secondary | ICD-10-CM

## 2023-02-20 DIAGNOSIS — L4050 Arthropathic psoriasis, unspecified: Secondary | ICD-10-CM

## 2023-02-20 DIAGNOSIS — Z8679 Personal history of other diseases of the circulatory system: Secondary | ICD-10-CM

## 2023-02-20 DIAGNOSIS — L408 Other psoriasis: Secondary | ICD-10-CM

## 2023-02-20 DIAGNOSIS — M810 Age-related osteoporosis without current pathological fracture: Secondary | ICD-10-CM

## 2023-02-20 DIAGNOSIS — Z8639 Personal history of other endocrine, nutritional and metabolic disease: Secondary | ICD-10-CM

## 2023-02-20 DIAGNOSIS — Z8673 Personal history of transient ischemic attack (TIA), and cerebral infarction without residual deficits: Secondary | ICD-10-CM

## 2023-02-20 NOTE — Patient Instructions (Signed)
Standing Labs We placed an order today for your standing lab work.   Please have your standing labs drawn in April and every 3 months   Please have your labs drawn 2 weeks prior to your appointment so that the provider can discuss your lab results at your appointment, if possible.  Please note that you may see your imaging and lab results in [place] before we have reviewed them. We will contact you once all results are reviewed. Please allow our office up to 72 hours to thoroughly review all of the results before contacting the office for clarification of your results.  WALK-IN LAB HOURS  Monday through Thursday from 8:00 am -12:30 pm and 1:00 pm-5:00 pm and Friday from 8:00 am-12:00 pm.  Patients with office visits requiring labs will be seen before walk-in labs.  You may encounter longer than normal wait times. Please allow additional time. Wait times may be shorter on  Monday and Thursday afternoons.  We do not book appointments for walk-in labs. We appreciate your patience and understanding with our staff.   Labs are drawn by [facility]. Please bring your co-pay at the time of your lab draw.  You may receive a bill from [facility] for your lab work.  Please note if you are on Hydroxychloroquine and and an order has been placed for a Hydroxychloroquine level,  you will need to have it drawn 4 hours or more after your last dose.  If you wish to have your labs drawn at another location, please call the office 24 hours in advance so we can fax the orders.  The office is located at [address], [place], [place], [place] [address]   If you have any questions regarding directions or hours of operation,  please call [phone].   As a reminder, please drink plenty of water prior to coming for your lab work. Thanks!

## 2023-03-07 ENCOUNTER — Encounter (INDEPENDENT_AMBULATORY_CARE_PROVIDER_SITE_OTHER): Payer: Medicare Other | Admitting: Ophthalmology

## 2023-04-02 ENCOUNTER — Other Ambulatory Visit: Payer: Self-pay | Admitting: Rheumatology

## 2023-04-02 NOTE — Telephone Encounter (Signed)
Last Fill: 02/15/2023 (30 day supply)  Labs: 01/16/2023 Glucose 205 Alk phos 35 BASO% 2.6 RDW 10.6  TB Gold: 07/11/2022 Neg   Next Visit: 07/19/2023  Last Visit: 02/20/2023  IT:GPQDIYMEB arthritis   Current Dose per office note 02/20/2023: Humira 40 mg sq injections every 14 days.   Okay to refill Humira?

## 2023-04-26 LAB — LAB REPORT - SCANNED: A1c: 8.6

## 2023-04-27 ENCOUNTER — Encounter: Payer: Self-pay | Admitting: Podiatry

## 2023-04-27 ENCOUNTER — Ambulatory Visit: Payer: Medicare Other | Admitting: Podiatry

## 2023-04-27 VITALS — BP 147/55

## 2023-04-27 DIAGNOSIS — M79674 Pain in right toe(s): Secondary | ICD-10-CM | POA: Diagnosis not present

## 2023-04-27 DIAGNOSIS — M79675 Pain in left toe(s): Secondary | ICD-10-CM

## 2023-04-27 DIAGNOSIS — E1151 Type 2 diabetes mellitus with diabetic peripheral angiopathy without gangrene: Secondary | ICD-10-CM

## 2023-04-27 DIAGNOSIS — B351 Tinea unguium: Secondary | ICD-10-CM

## 2023-04-27 NOTE — Progress Notes (Signed)
  Subjective: Margaret Hobbs presents today for at risk foot care.   Chief Complaint  Patient presents with   Nail Problem    DFC BS-did not check today A1C-8.0 PCP-[facility] PCP VST-04/26/2023   Cleatis Polka., MD is patient's PCP.  Allergies  Allergen Reactions   Remicade [Infliximab] Other (See Comments)    Convulsion and felt cold    Amiodarone     Per pr report she was dizzy, weak and lethargic   Dapagliflozin     Other reaction(s): dysequilibrium, urinary frequency   Sulfa Antibiotics Rash   Review of Systems: Negative except as noted in the HPI.   Objective: Vitals:   04/27/23 0844  BP: (!) 147/55   Margaret Hobbs is a pleasant 72 y.o. female in NAD. AAO X 3.  Vascular Examination: CFT <3 seconds b/l LE. Faintly palpable pedal pulses b/l. Pedal hair absent. No pain with calf compression b/l. Lower extremity skin temperature gradient within normal limits. Trace edema noted BLE. No cyanosis or clubbing noted b/l LE.  Dermatological Examination: Pedal skin is warm and supple b/l LE. No open wounds b/l LE. No interdigital macerations noted b/l LE. Toenails 1-5 b/l elongated, discolored, dystrophic, thickened, crumbly with subungual debris and tenderness to dorsal palpation. No hyperkeratotic nor porokeratotic lesions present on today's visit.  Neurological Examination: Protective sensation intact 5/5 intact bilaterally with 10g monofilament b/l. Vibratory sensation intact b/l.  Musculoskeletal Examination: Muscle strength 5/5 to all LE muscle groups of left lower extremity. Muscle strength 3/5 to all LE muscle groups of right lower extremity. Limp RLE with ambulation. Ambulates independently without assistance.  Lab Results  Component Value Date   HGBA1C 7.2 (H) 04/01/2021   Assessment: 1. Pain due to onychomycosis of toenails of both feet   2. 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 All patient's and/or POA's questions/concerns answered on today's visit. -Continue foot and shoe inspections daily. Monitor blood glucose per PCP/Endocrinologist's recommendations. -Patient to continue soft, supportive shoe gear daily. -Toenails 1-5 b/l were debrided in length and girth with sterile nail nippers and dremel without iatrogenic bleeding.  -Patient/POA to call should there be question/concern in the interim. Return in about 3 months (around 07/28/2023).  Freddie Breech, DPM

## 2023-04-30 ENCOUNTER — Telehealth: Payer: Self-pay | Admitting: *Deleted

## 2023-04-30 NOTE — Telephone Encounter (Signed)
Labs received from:Dr. Martha Clan  Drawn on: 04/24/2023  Reviewed by: Dr. Pollyann Savoy   Labs drawn: CMP, CBC, Vitamin D  Results:Glucose 193  Alk. Phos 33  BUN 38   RDW 12.2  MPV 15.3  Patient is on Humira 40 mg/0.4 mL SQ weekly.

## 2023-06-08 ENCOUNTER — Emergency Department (HOSPITAL_COMMUNITY): Payer: Medicare Other

## 2023-06-08 ENCOUNTER — Emergency Department (HOSPITAL_COMMUNITY)
Admission: EM | Admit: 2023-06-08 | Discharge: 2023-06-08 | Disposition: A | Discharge status: Home / Self Care | Payer: Medicare Other | Attending: Emergency Medicine | Admitting: Emergency Medicine

## 2023-06-08 ENCOUNTER — Other Ambulatory Visit: Payer: Self-pay

## 2023-06-08 DIAGNOSIS — I10 Essential (primary) hypertension: Secondary | ICD-10-CM | POA: Diagnosis not present

## 2023-06-08 DIAGNOSIS — E119 Type 2 diabetes mellitus without complications: Secondary | ICD-10-CM | POA: Diagnosis not present

## 2023-06-08 DIAGNOSIS — Z7901 Long term (current) use of anticoagulants: Secondary | ICD-10-CM | POA: Insufficient documentation

## 2023-06-08 DIAGNOSIS — E876 Hypokalemia: Secondary | ICD-10-CM | POA: Diagnosis not present

## 2023-06-08 DIAGNOSIS — Z79899 Other long term (current) drug therapy: Secondary | ICD-10-CM | POA: Diagnosis not present

## 2023-06-08 DIAGNOSIS — I2510 Atherosclerotic heart disease of native coronary artery without angina pectoris: Secondary | ICD-10-CM | POA: Diagnosis not present

## 2023-06-08 DIAGNOSIS — R000 Tachycardia, unspecified: Secondary | ICD-10-CM | POA: Diagnosis present

## 2023-06-08 DIAGNOSIS — R072 Precordial pain: Secondary | ICD-10-CM | POA: Diagnosis not present

## 2023-06-08 LAB — CBC WITH DIFFERENTIAL/PLATELET
Abs Immature Granulocytes: 0.01 10*3/uL (ref 0.00–0.07)
Basophils Absolute: 0.1 10*3/uL (ref 0.0–0.1)
Basophils Relative: 1 %
Eosinophils Absolute: 0.1 10*3/uL (ref 0.0–0.5)
Eosinophils Relative: 3 %
HCT: 39.5 % (ref 36.0–46.0)
Hemoglobin: 12.9 g/dL (ref 12.0–15.0)
Immature Granulocytes: 0 %
Lymphocytes Relative: 20 %
Lymphs Abs: 1.1 10*3/uL (ref 0.7–4.0)
MCH: 30.4 pg (ref 26.0–34.0)
MCHC: 32.7 g/dL (ref 30.0–36.0)
MCV: 92.9 fL (ref 80.0–100.0)
Monocytes Absolute: 0.4 10*3/uL (ref 0.1–1.0)
Monocytes Relative: 7 %
Neutro Abs: 4 10*3/uL (ref 1.7–7.7)
Neutrophils Relative %: 69 %
Platelets: 229 10*3/uL (ref 150–400)
RBC: 4.25 MIL/uL (ref 3.87–5.11)
RDW: 12.3 % (ref 11.5–15.5)
WBC: 5.7 10*3/uL (ref 4.0–10.5)
nRBC: 0 % (ref 0.0–0.2)

## 2023-06-08 LAB — BASIC METABOLIC PANEL
Anion gap: 12 (ref 5–15)
Anion gap: 10 (ref 5–15)
BUN: 29 mg/dL — ABNORMAL HIGH (ref 8–23)
BUN: 21 mg/dL (ref 8–23)
CO2: 24 mmol/L (ref 22–32)
CO2: 19 mmol/L — ABNORMAL LOW (ref 22–32)
Calcium: 8.9 mg/dL (ref 8.9–10.3)
Calcium: 7.2 mg/dL — ABNORMAL LOW (ref 8.9–10.3)
Chloride: 100 mmol/L (ref 98–111)
Chloride: 110 mmol/L (ref 98–111)
Creatinine, Ser: 1.09 mg/dL — ABNORMAL HIGH (ref 0.44–1.00)
Creatinine, Ser: 0.86 mg/dL (ref 0.44–1.00)
GFR, Estimated: 55 mL/min — ABNORMAL LOW (ref >60)
GFR, Estimated: >60 mL/min (ref >60)
Glucose, Bld: 186 mg/dL — ABNORMAL HIGH (ref 70–99)
Glucose, Bld: 145 mg/dL — ABNORMAL HIGH (ref 70–99)
Potassium: 2.8 mmol/L — ABNORMAL LOW (ref 3.5–5.1)
Potassium: 3 mmol/L — ABNORMAL LOW (ref 3.5–5.1)
Sodium: 136 mmol/L (ref 135–145)
Sodium: 139 mmol/L (ref 135–145)

## 2023-06-08 LAB — MAGNESIUM: Magnesium: 1.4 mg/dL — ABNORMAL LOW (ref 1.7–2.4)

## 2023-06-08 LAB — TROPONIN I (HIGH SENSITIVITY)
Troponin I (High Sensitivity): 9 ng/L (ref <18)
Troponin I (High Sensitivity): 6 ng/L (ref <18)

## 2023-06-08 LAB — T4, FREE: Free T4: 1.06 ng/dL (ref 0.61–1.12)

## 2023-06-08 LAB — TSH: TSH: 1.605 u[IU]/mL (ref 0.350–4.500)

## 2023-06-08 MED ORDER — POTASSIUM CHLORIDE CRYS ER 20 MEQ PO TBCR: 20.0000 meq | EXTENDED_RELEASE_TABLET | Freq: Every day | ORAL | 0 refills | Status: DC

## 2023-06-08 MED ORDER — POTASSIUM CHLORIDE CRYS ER 20 MEQ PO TBCR
40.0000 meq | EXTENDED_RELEASE_TABLET | Freq: Once | ORAL | Status: AC
  Administered 2023-06-08: 40 meq via ORAL
  Filled 2023-06-08: qty 2

## 2023-06-08 MED ORDER — MAGNESIUM SULFATE 2 GM/50ML IV SOLN
2.0000 g | Freq: Once | INTRAVENOUS | Status: AC
  Administered 2023-06-08: 2 g via INTRAVENOUS
  Filled 2023-06-08: qty 50

## 2023-06-08 MED ORDER — SODIUM CHLORIDE 0.9 % IV BOLUS
500.0000 mL | Freq: Once | INTRAVENOUS | Status: AC
  Administered 2023-06-08: 500 mL via INTRAVENOUS

## 2023-06-08 MED ORDER — POTASSIUM CHLORIDE 10 MEQ/100ML IV SOLN
10.0000 meq | Freq: Once | INTRAVENOUS | Status: AC
  Administered 2023-06-08: 10 meq via INTRAVENOUS
  Filled 2023-06-08: qty 100

## 2023-06-08 MED ORDER — MAGNESIUM OXIDE 400 MG PO CAPS: 400.0000 mg | ORAL_CAPSULE | Freq: Every day | ORAL | 0 refills | Status: AC

## 2023-06-08 NOTE — ED Provider Notes (Signed)
.  Critical Care  Performed by: Terald Sleeper, MD Authorized by: Terald Sleeper, MD   Critical care provider statement:    Critical care time (minutes):  30   Critical care time was exclusive of:  Separately billable procedures and treating other patients   Critical care was necessary to treat or prevent imminent or life-threatening deterioration of the following conditions:  Metabolic crisis   Critical care was time spent personally by me on the following activities:  Ordering and performing treatments and interventions, ordering and review of laboratory studies, ordering and review of radiographic studies, pulse oximetry, review of old charts, examination of patient and evaluation of patient's response to treatment Comments:     IV K and Mg repletion for arrhythmia     Terald Sleeper, MD 06/08/23 1645

## 2023-06-08 NOTE — ED Triage Notes (Signed)
Pt to the ed from home with a CC of SVT. Pt was woken up in her sleep feeling chest palpitations with dizziness. When EMS arrived and placed pt on monitor pt was in SVT. EMS relays pt was able to convert on her own while en route to hospital. Pt denies current CP, dizziness, Pt relays cardiac hx.

## 2023-06-08 NOTE — Discharge Instructions (Addendum)
It was a pleasure taking care of you today!   Your labs overall looked well. Your low potassium and magnesium was treated in the ED. You will be sent home with a prescription for potassium and magnesium.  Continue to take your prescription potassium as prescribed, wait at least 12 hours before you take the second dose of potassium that I prescribed today. Take as prescribed. Attached is information for potassium rich foods. Follow up with your primary care provider in 1 week for potassium recheck.  Return to the emergency department if you experience increasing/worsening symptoms.

## 2023-06-08 NOTE — ED Provider Notes (Signed)
[place] EMERGENCY DEPARTMENT AT [facility] Provider Note   CSN: [id number] Arrival date & time: 06/08/23  6387     History  Chief Complaint  Patient presents with   Tachycardia    Margaret Hobbs is a 72 y.o. female with a PMHx of HTN, DM, CVA, CAD, MI, who presents to the ED brought in by EMS with concerns for tachycardia onset this morning. Notes that she was asleep and was woken out of her sleep due to her heart beating fast. No history of similar symptoms. Notes that she had sternal chest pain initially due to her palpations and felt as if her heart was pounding. Denies chest pain currently. Has associated dry cough, resolved chest pain, palpitations, resolved nausea. No medications tried. Denies fever, rhinorrhea, nasal congestion, vomiting, abdominal pain, leg swelling, shortness of breath, or urinary symptoms.   The history is provided by the patient. No language interpreter was used.       Home Medications Prior to Admission medications   Medication Sig Start Date End Date Taking? Authorizing Provider  Magnesium Oxide 400 MG CAPS Take 1 capsule (400 mg total) by mouth daily for 10 days. 06/08/23 06/18/23 Yes Rehmat Murtagh A, PA-C  potassium chloride SA (KLOR-CON M) 20 MEQ tablet Take 1 tablet (20 mEq total) by mouth daily. 06/08/23  Yes Elain Wixon A, PA-C  amoxicillin (AMOXIL) 500 MG capsule Take 2,000 mg by mouth as directed. Take 4 capsules (2000 mg) by mouth 1 hour prior to dental procedures 01/30/18   [provider]  Ascorbic Acid (VITAMIN C) 500 MG tablet Take 500 mg by mouth 3 (three) times a week.    [provider]  cholecalciferol (VITAMIN D) 1000 UNITS tablet Take 1,000 Units by mouth 2 (two) times daily.    [provider]  clopidogrel (PLAVIX) 75 MG tablet TAKE 1 TABLET BY MOUTH DAILY 02/15/23   Wendall Stade, MD  Coenzyme Q10 100 MG TABS Take 100 mg by mouth in the morning.    [provider]  ELIQUIS 5 MG  TABS tablet TAKE 1 TABLET BY MOUTH TWICE A DAY 10/27/21   Runell Gess, MD  ezetimibe (ZETIA) 10 MG tablet Take 1 tablet by mouth daily.    [provider]  folic acid (FOLVITE) 1 MG tablet Take 1 tablet by mouth daily. 04/16/12   [provider]  glipiZIDE-metformin (METAGLIP) 5-500 MG tablet Take 1 tablet by mouth 2 (two) times daily.    [provider]  glucose blood (ONETOUCH VERIO) test strip 1 strip In Vitro daily for 30 days 07/03/22   [provider]  HUMIRA, 2 SYRINGE, 40 MG/0.4ML PSKT INJECT 40MG  SUBCUTANEOUSLY EVERY OTHER WEEK 04/02/23   Gearldine Bienenstock, PA-C  hydrALAZINE (APRESOLINE) 50 MG tablet TAKE 1 TABLET BY MOUTH IN THE  MORNING AND AT BEDTIME 01/12/23   Wendall Stade, MD  Lancets [facility] DELICA PLUS [place]) MISC Apply topically daily. 07/04/22   [provider]  metoprolol succinate (TOPROL-XL) 50 MG 24 hr tablet TAKE 1 TABLET BY MOUTH DAILY  WITH OR IMMEDIATELY FOLLOWING A  MEAL 01/12/23   Wendall Stade, MD  neomycin-polymyxin-hydrocortisone (CORTISPORIN) 3.5-10000-1 OTIC suspension Apply 1-2 drops daily after soaking and cover with bandaid Patient not taking: Reported on 08/23/2022 02/14/22   Edwin Cap, DPM  nitroGLYCERIN (NITROSTAT) 0.4 MG SL tablet PLACE 1 TABLET UNDER THE TONGUE EVERY 5 (FIVE) MINUTES AS NEEDED FOR CHEST PAIN (3 DOSES MAX) Patient not taking:  Reported on 02/20/2023 04/30/18   Wendall Stade, MD  olmesartan-hydrochlorothiazide (BENICAR HCT) 40-25 MG tablet Take 1 tablet by mouth daily.    [provider]  potassium chloride SA (KLOR-CON M) 20 MEQ tablet 1 tablet with food    [provider]  simvastatin (ZOCOR) 40 MG tablet Take 1 tablet (40 mg total) by mouth at bedtime. 08/02/21   Wendall Stade, MD  VITAMIN A PO Take 3,000 Units by mouth daily.    [provider]  vitamin E 400 UNIT capsule Take 400 Units by mouth in the morning.    [provider]      Allergies     Remicade [infliximab], Amiodarone, Dapagliflozin, and Sulfa antibiotics    Review of Systems   Review of Systems  Constitutional:  Negative for fever.  HENT:  Negative for congestion and rhinorrhea.   Respiratory:  Positive for cough (dry, mild). Negative for shortness of breath.   Cardiovascular:  Positive for chest pain (resolved) and palpitations. Negative for leg swelling.  Gastrointestinal:  Positive for nausea (resolved). Negative for vomiting.  Genitourinary:  Negative for dysuria and hematuria.  All other systems reviewed and are negative.   Physical Exam Updated Vital Signs BP 124/63 (BP Location: Right Arm)   Pulse 86   Temp 98.5 F (36.9 C) (Oral)   Resp 15   Ht 5\' 2"  (1.575 m)   Wt 91 kg   SpO2 97%   BMI 36.69 kg/m  Physical Exam Vitals and nursing note reviewed.  Constitutional:      General: She is not in acute distress.    Appearance: Normal appearance.  Eyes:     General: No scleral icterus.    Extraocular Movements: Extraocular movements intact.  Cardiovascular:     Rate and Rhythm: Normal rate.  Pulmonary:     Effort: Pulmonary effort is normal. No respiratory distress.  Abdominal:     Palpations: Abdomen is soft. There is no mass.     Tenderness: There is no abdominal tenderness.  Musculoskeletal:        General: Normal range of motion.     Cervical back: Neck supple.  Skin:    General: Skin is warm and dry.     Findings: No rash.  Neurological:     Mental Status: She is alert.     Sensory: Sensation is intact.     Motor: Motor function is intact.  Psychiatric:        Behavior: Behavior normal.     ED Results / Procedures / Treatments   Labs (all labs ordered are listed, but only abnormal results are displayed) Labs Reviewed  BASIC METABOLIC PANEL - Abnormal; Notable for the following components:      Result Value   Potassium 2.8 (*)    Glucose, Bld 186 (*)    BUN 29 (*)    Creatinine, Ser 1.09 (*)    GFR, Estimated 55 (*)    All  other components within normal limits  MAGNESIUM - Abnormal; Notable for the following components:   Magnesium 1.4 (*)    All other components within normal limits  BASIC METABOLIC PANEL - Abnormal; Notable for the following components:   Potassium 3.0 (*)    CO2 19 (*)    Glucose, Bld 145 (*)    Calcium 7.2 (*)    All other components within normal limits  CBC WITH DIFFERENTIAL/PLATELET  TSH  T4, FREE  TROPONIN I (HIGH SENSITIVITY)  TROPONIN I (HIGH  SENSITIVITY)    EKG EKG Interpretation  Date/Time:  Friday June 08 2023 09:11:49 EDT Ventricular Rate:  91 PR Interval:  211 QRS Duration: 89 QT Interval:  373 QTC Calculation: 459 R Axis:   -40 Text Interpretation: Sinus rhythm Borderline prolonged PR interval No sig change from prior ecg April 01 2021 Confirmed by Alvester Chou [id number]) on 06/08/2023 9:27:28 AM  Radiology DG Chest 2 View  Result Date: 06/08/2023 CLINICAL DATA:  Chest pain EXAM: CHEST - 2 VIEW COMPARISON:  X-ray 04/27/2008 FINDINGS: Trace left basilar atelectasis. No consolidation, pneumothorax or effusion. No edema. Normal cardiopericardial silhouette. Degenerative changes of the spine. IMPRESSION: Minimal left basilar atelectasis or scar. Otherwise no acute cardiopulmonary disease. Electronically Signed   By: Karen Kays M.D.   On: 06/08/2023 10:44    Procedures Procedures    Medications Ordered in ED Medications  sodium chloride 0.9 % bolus 500 mL (500 mLs Intravenous New Bag/Given 06/08/23 1034)  potassium chloride 10 mEq in 100 mL IVPB (10 mEq Intravenous New Bag/Given 06/08/23 1129)  potassium chloride SA (KLOR-CON M) CR tablet 40 mEq (40 mEq Oral Given 06/08/23 1133)  magnesium sulfate IVPB 2 g 50 mL (2 g Intravenous New Bag/Given 06/08/23 1130)    ED Course/ Medical Decision Making/ A&P Clinical Course as of 06/08/23 1440  Fri Jun 08, 2023  1111 This is a 72 year old female with a history of paroxysmal A-fib, presenting to the ED with a tachycardia  now resolved.  EMS reports the patient was in a "SVT" en route to the hospital and that she spontaneously converted herself to sinus rhythm.  Patient had reported palpitations and chest pressure this morning.  On arrival the patient is in a sinus rhythm.  Her EKG per my interpretation shows sinus rhythm without evidence of heart block, PVC noted.  She also has occasional PVCs on her telemetry.  But has maintained sinus rhythm for the first 2 hours in the ED.  We will continue to monitor on telemetry.  Her x-ray does not show any emergent findings.  Her blood tests are notable for hypokalemia and hypomagnesemia which will be can replete and care.  She is otherwise asymptomatic now [MT]  1317 Reevaluated and resting comfortably on stretcher.  Discussed with family members regarding lab findings.  Discussed with patient and family regarding pending lab results. [SB]  1433 Discussed with patient lab findings. Discussed with patient discharge treatment plan. Answered all available questions. Pt appears safe for discharge at this time.  [SB]    Clinical Course User Index [MT] Trifan, Kermit Balo, MD [SB] Tyreka Henneke A, PA-C                             Medical Decision Making Amount and/or Complexity of Data Reviewed Labs: ordered. Radiology: ordered.  Risk OTC drugs. Prescription drug management.   Pt presents with concerns for concerns for tachycardia onset this morning.  No history of similar symptoms.  Patient afebrile.  On exam patient without acute cardiovascular, aspiratory exam findings.  Differential diagnosis includes thyroid abnormality, ACS, pneumonia, anemia, hypoglycemia, arrhythmia, electrolyte abnormality.   Co morbidities that complicate the patient evaluation: Diabetes, hypertension, hyperparathyroidism  Labs:  I ordered, and personally interpreted labs.  The pertinent results include:   CBC unremarkable TSH, T4 unremarkable Initial troponin 9, delta troponin at 6 Magnesium  at 1.4 (replenished in the ED) Initial BMP with potassium at 2.8, repeat BMP after potassium repletion, potassium  at 3.0.  Imaging: I ordered imaging studies including Chest x-ray I independently visualized and interpreted imaging which showed:  Minimal left basilar atelectasis or scar. Otherwise no acute  cardiopulmonary disease.   I agree with the radiologist interpretation  Medications:  I ordered medication including potassium, IV fluids, magnesium for repletion Reevaluation of the patient after these medicines and interventions, I reevaluated the patient and found that they have improved I have reviewed the patients home medicines and have made adjustments as needed   Disposition: Presenting suspicious for tachycardia.  Also notable for hypomagnesia, hypokalemia.  Doubt concerns at this time for arrhythmia, anemia, hypoglycemia. After consideration of the diagnostic results and the patients response to treatment, I feel that the patient would benefit from Discharge home.  At time of discharge, patient remains without continuation of her tachycardia, palpitations, chest pain.  She has been free of the symptoms since being in the emergency department for almost 6 hours.  Patient discharged home with a prescription for potassium and magnesium oxide.  Patient instructed to follow-up with primary care provider next week for potassium and magnesium recheck.  Supportive care measures and strict return precautions discussed with patient at bedside. Pt acknowledges and verbalizes understanding. Pt appears safe for discharge. Follow up as indicated in discharge paperwork.    This chart was dictated using voice recognition software, Dragon. Despite the best efforts of this provider to proofread and correct errors, errors may still occur which can change documentation meaning.   Final Clinical Impression(s) / ED Diagnoses Final diagnoses:  Tachycardia  Hypokalemia  Hypomagnesemia    Rx / DC  Orders ED Discharge Orders          Ordered    Magnesium Oxide 400 MG CAPS  Daily        06/08/23 1356    potassium chloride SA (KLOR-CON M) 20 MEQ tablet  Daily        06/08/23 1356              Alie Moudy A, PA-C 06/08/23 1444    Terald Sleeper, MD 06/08/23 1644

## 2023-06-11 ENCOUNTER — Other Ambulatory Visit: Payer: Self-pay | Admitting: Physician Assistant

## 2023-06-11 NOTE — Telephone Encounter (Signed)
Last Fill: 04/02/2023  Labs: 06/08/2023  Potassium 3.0, CO2 19, Glucose 145, Calcium 7.2  TB Gold: 07/11/2022 Neg    Next Visit: 07/19/2023  Last Visit: 02/20/2023  DX: Psoriatic arthritis  02/20/2023 Current Dose per office note 02/20/2023: Humira 40 mg sq injections every 14 days.   Okay to refill Humira?

## 2023-06-14 DIAGNOSIS — I480 Paroxysmal atrial fibrillation: Secondary | ICD-10-CM | POA: Insufficient documentation

## 2023-06-14 NOTE — Progress Notes (Signed)
Cardiology Office Note:    Date:  06/15/2023  ID:  Margaret Hobbs, DOB [birth year], MRN [id number] PCP: Cleatis Polka., MD  [facility] Providers Cardiologist:  Charlton Haws, MD Electrophysiologist:  Lanier Prude, MD       Patient Profile:      Coronary artery disease Inf STEMI in 12/2006 s/p BMS to RCA Cath 05/2007: pLAD 30, mLAD 60, dLAD 80, oD2 70, pRCA 60, mRCA 50, dRCA stent ok, prox post AV 95, EF 60 Myoview 07/28/21: no ischemia, EF 62, low risk  TTE 04/14/21: EF 60-65, no RWMA, Gr 1 DD, NL RVSF, triival MR, AV sclerosis, RAP 3 Atrial fibrillation  Developed during PV a-gram in 4/22 Event monitor 03/14/22: Freq PAF/Flutter, rare PVC, NSVT 6 beats Intol of Amiodarone (dizziness) Pt declined Tikosyn Diabetes mellitus  Hypertension  Hyperlipidemia  Intol of Atorvastatin  Carotid artery disease [place] 09/29/21: no sig ICA stenosis  Hx of TIA Peripheral arterial disease  Bilat SFA disease; Dr. Randie Heinz - VVS Psoriatic arthritis       History of Present Illness:   Margaret Hobbs is a 72 y.o. female who returns for post ED visit follow up. She was last seen by Dr. Eden Emms in 07/2022. She went to the ED 06/08/23 w palpitations. EKG showed NSR. Magnesium was low at 1.4 and K+ was low at 2.8. These were repleted. Creatinine was mildly elevated at 1.09. Hgb, TSH was normal. hsTroponin was negative x 2. CXR showed no acute disease.   She is here today with her son.  She notes that she had recently been placed on Rybelsus.  She went to the emergency room after her first dose.  She woke up that evening with chest pain and palpitations.  Notes from the emergency room indicate that EMS documented SVT.  I do not have documentation of that.  She does have a history of atrial fibrillation.  Question if this could have also been atrial fibrillation with RVR.  She notes significant nausea with the Rybelsus.  This medication has been stopped.  The chest symptoms she had when she went  to the emergency room were severe and reminded her of her heart attack.  As noted, her troponins were negative x 2.  She has not had any recurrent severe chest pain.  She continues to feel a pressure or sinking feeling from time to time.  She still has PVCs and PACs on her EKG.  Question if this is related.  She has not had shortness of breath, syncope.  She sleeps on an incline chronically.  She has some mild pedal edema.  Review of Systems  Gastrointestinal:  Positive for diarrhea (chronic). Negative for hematochezia and melena.  Genitourinary:  Negative for hematuria.   see HPI    Studies Reviewed:   EKG Interpretation  Date/Time:  Friday June 15 2023 08:38:29 EDT Ventricular Rate:  83 PR Interval:  192 QRS Duration: 78 QT Interval:  388 QTC Calculation: 455 R Axis:   -19 Text Interpretation: Sinus rhythm with occasional Premature ventricular complexes and Premature atrial complexes Inferior infarct , age undetermined Anterolateral infarct , age undetermined No significant change when compared to prior tracings Confirmed by Tereso Newcomer [phone]) on 06/15/2023 8:54:56 AM    Risk Assessment/Calculations:    CHA2DS2-VASc Score = 7   This indicates a 11.2% annual risk of stroke. The patient's score is based upon: CHF History: 0 HTN History: 1 Diabetes History: 1 Stroke History: 2 Vascular Disease History: 1 Age  Score: 1 Gender Score: 1            Physical Exam:   VS:  BP (!) 130/58   Pulse 82   Ht 5\' 2"  (1.575 m)   Wt 195 lb 12.8 oz (88.8 kg)   SpO2 94%   BMI 35.81 kg/m    Wt Readings from Last 3 Encounters:  06/15/23 195 lb 12.8 oz (88.8 kg)  06/08/23 200 lb 9.9 oz (91 kg)  02/20/23 201 lb 12.8 oz (91.5 kg)    Constitutional:      Appearance: Healthy appearance. Not in distress.  Neck:     Vascular: JVD normal.  Pulmonary:     Breath sounds: Normal breath sounds. No wheezing. No rales.  Cardiovascular:     Normal rate. Irregular rhythm.     Murmurs: There is no  murmur.  Edema:    Peripheral edema absent.  Abdominal:     Palpations: Abdomen is soft.       ASSESSMENT AND PLAN:   Coronary artery disease involving native coronary artery of native heart with angina pectoris (HCC) History of inferior STEMI in 2008 treated with stenting to the RCA.  Repeat cardiac catheterization in 2008 demonstrated patent stent in the RCA.  She had severe distal vessel disease and moderate nonobstructive disease elsewhere.  She has been managed medically.  Stress test in 2022 demonstrated no ischemia.  She had chest symptoms in the setting of rapid palpitations and noted tachycardia by EMS prompting her to go the emergency room.  It is reassuring that her troponins were normal.  She has continued to have some chest discomfort since she went to the emergency room.  Chest symptoms may be related to PACs/PVCs.  She does note that her presenting symptoms were similar to her previous angina.  I have recommended proceeding with stress testing to rule out significant ischemia. Lexiscan Myoview Continue Plavix 75 mg daily, Toprol-XL, simvastatin 40 mg daily, nitroglycerin as needed Follow-up 3 months or sooner if stress test abnormal  Paroxysmal atrial fibrillation (HCC) She has been intolerant of amiodarone.  She has seen EP in the past but has declined Tikosyn.  As noted, she has had palpitations prompting her recent visit to the emergency room with associated chest pain.  Notes indicate that EMS documented tachycardia.  She may have had SVT versus rapid atrial fibrillation.  She has not had a recurrence of that symptom.  If she has continuing palpitations or recurrent rapid palpitations, we can certainly arrange a follow-up event monitor.  Continue Eliquis 5 mg twice daily.  Increase metoprolol succinate to 75 mg daily.  Follow-up 3 months.  Essential hypertension Blood pressure controlled.  Continue hydralazine 50 mg daily, Benicar HCT 40/25 mg daily.  Increase metoprolol  succinate to 75 mg daily as noted for A-fib, palpitations.  PVC's (premature ventricular contractions) Noted on electrocardiogram today.  These may be contributing to some of her symptoms.  She had hypokalemia and hypomagnesia noted in the emergency room.  She had follow-up labs with primary care earlier this week on 06/13/2023.  These were requested.  Her potassium is normal at 3.9, creatinine normal at 1.2, magnesium normal at 1.8.  Increase metoprolol succinate to 75 mg daily as noted.    Informed Consent   Shared Decision Making/Informed Consent The risks [chest pain, shortness of breath, cardiac arrhythmias, dizziness, blood pressure fluctuations, myocardial infarction, stroke/transient ischemic attack, nausea, vomiting, allergic reaction, radiation exposure, metallic taste sensation and life-threatening complications (estimated to be 1  in 10,000)], benefits (risk stratification, diagnosing coronary artery disease, treatment guidance) and alternatives of a nuclear stress test were discussed in detail with Ms. Bar and she agrees to proceed.     Dispo:  Return in about 3 months (around 09/15/2023) for Routine Follow Up with Dr. Eden Emms, or Tereso Newcomer, PA-C.  Signed, Tereso Newcomer, PA-C

## 2023-06-15 ENCOUNTER — Encounter: Payer: Self-pay | Admitting: Physician Assistant

## 2023-06-15 ENCOUNTER — Ambulatory Visit: Payer: Medicare Other | Attending: Physician Assistant | Admitting: Physician Assistant

## 2023-06-15 VITALS — BP 130/58 | HR 82 | Ht 62.0 in | Wt 195.8 lb

## 2023-06-15 DIAGNOSIS — I480 Paroxysmal atrial fibrillation: Secondary | ICD-10-CM

## 2023-06-15 DIAGNOSIS — I493 Ventricular premature depolarization: Secondary | ICD-10-CM | POA: Diagnosis not present

## 2023-06-15 DIAGNOSIS — I10 Essential (primary) hypertension: Secondary | ICD-10-CM | POA: Diagnosis not present

## 2023-06-15 DIAGNOSIS — I25119 Atherosclerotic heart disease of native coronary artery with unspecified angina pectoris: Secondary | ICD-10-CM

## 2023-06-15 MED ORDER — NITROGLYCERIN 0.4 MG SL SUBL: SUBLINGUAL_TABLET | SUBLINGUAL | 2 refills | Status: DC

## 2023-06-15 MED ORDER — METOPROLOL SUCCINATE ER 50 MG PO TB24: 75.0000 mg | ORAL_TABLET | Freq: Every day | ORAL | 3 refills | Status: DC

## 2023-06-15 NOTE — Assessment & Plan Note (Signed)
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  Repeat cardiac catheterization in 2008 demonstrated patent stent in the RCA.  She had severe distal vessel disease and moderate nonobstructive disease elsewhere.  She has been managed medically.  Stress test in 2022 demonstrated no ischemia.  She had chest symptoms in the setting of rapid palpitations and noted tachycardia by EMS prompting her to go the emergency room.  It is reassuring that her troponins were normal.  She has continued to have some chest discomfort since she went to the emergency room.  Chest symptoms may be related to PACs/PVCs.  She does note that her presenting symptoms were similar to her previous angina.  I have recommended proceeding with stress testing to rule out significant ischemia. Lexiscan Myoview Continue Plavix 75 mg daily, Toprol-XL, simvastatin 40 mg daily, nitroglycerin as needed Follow-up 3 months or sooner if stress test abnormal

## 2023-06-15 NOTE — Patient Instructions (Signed)
Medication Instructions:  Your physician has recommended you make the following change in your medication:   INCREASE the Toprol to 50 taking 1 1/2 tablet daily  *If you need a refill on your cardiac medications before your next appointment, please call your pharmacy*   Lab Work: None ordered  If you have labs (blood work) drawn today and your tests are completely normal, you will receive your results only by: MyChart Message (if you have MyChart) OR A paper copy in the mail If you have any lab test that is abnormal or we need to change your treatment, we will call you to review the results.   Testing/Procedures: Your physician has requested that you have a lexiscan myoview. For further information please visit [email or web address] Please follow instruction sheet, BELOW:    You are scheduled for a Myocardial Perfusion Imaging Study  Please arrive 15 minutes prior to your appointment time for registration and insurance purposes.  The test will take approximately 3 to 4 hours to complete; you may bring reading material.  If someone comes with you to your appointment, they will need to remain in the main lobby due to limited space in the testing area. **If you are pregnant or breastfeeding, please notify the nuclear lab prior to your appointment**  How to prepare for your Myocardial Perfusion Test: Do not eat or drink 3 hours prior to your test, except you may have water. Do not consume products containing caffeine (regular or decaffeinated) 12 hours prior to your test. (ex: coffee, chocolate, sodas, tea). Do bring a list of your current medications with you.  If not listed below, you may take your medications as normal. Do wear comfortable clothes (no dresses or overalls) and walking shoes, tennis shoes preferred (No heels or open toe shoes are allowed). Do NOT wear cologne, perfume, aftershave, or lotions (deodorant is allowed). If these instructions are not followed, your test will  have to be rescheduled.     Follow-Up: At [facility], you and your health needs are our priority.  As part of our continuing mission to provide you with exceptional heart care, we have created designated Provider Care Teams.  These Care Teams include your primary Cardiologist (physician) and Advanced Practice Providers (APPs -  Physician Assistants and Nurse Practitioners) who all work together to provide you with the care you need, when you need it.  We recommend signing up for the patient portal called "MyChart".  Sign up information is provided on this After Visit Summary.  MyChart is used to connect with patients for Virtual Visits (Telemedicine).  Patients are able to view lab/test results, encounter notes, upcoming appointments, etc.  Non-urgent messages can be sent to your provider as well.   To learn more about what you can do with MyChart, go to [email or web address].    Your next appointment:   3 month(s)  Provider:   Charlton Haws, MD  or Tereso Newcomer, PA-C         Other Instructions

## 2023-06-15 NOTE — Assessment & Plan Note (Addendum)
She has been intolerant of amiodarone.  She has seen EP in the past but has declined Tikosyn.  As noted, she has had palpitations prompting her recent visit to the emergency room with associated chest pain.  Notes indicate that EMS documented tachycardia.  She may have had SVT versus rapid atrial fibrillation.  She has not had a recurrence of that symptom.  If she has continuing palpitations or recurrent rapid palpitations, we can certainly arrange a follow-up event monitor.  Continue Eliquis 5 mg twice daily.  Increase metoprolol succinate to 75 mg daily.  Follow-up 3 months.

## 2023-06-15 NOTE — Assessment & Plan Note (Signed)
Blood pressure controlled.  Continue hydralazine 50 mg daily, Benicar HCT 40/25 mg daily.  Increase metoprolol succinate to 75 mg daily as noted for A-fib, palpitations.

## 2023-06-15 NOTE — Assessment & Plan Note (Signed)
Noted on electrocardiogram today.  These may be contributing to some of her symptoms.  She had hypokalemia and hypomagnesia noted in the emergency room.  She had follow-up labs with primary care earlier this week on 06/13/2023.  These were requested.  Her potassium is normal at 3.9, creatinine normal at 1.2, magnesium normal at 1.8.  Increase metoprolol succinate to 75 mg daily as noted.

## 2023-06-18 ENCOUNTER — Other Ambulatory Visit: Payer: Self-pay | Admitting: Physician Assistant

## 2023-06-18 ENCOUNTER — Encounter (HOSPITAL_COMMUNITY): Payer: Medicare Other

## 2023-06-18 DIAGNOSIS — I25119 Atherosclerotic heart disease of native coronary artery with unspecified angina pectoris: Secondary | ICD-10-CM

## 2023-06-20 ENCOUNTER — Telehealth (HOSPITAL_COMMUNITY): Payer: Self-pay | Admitting: *Deleted

## 2023-06-20 NOTE — Telephone Encounter (Signed)
Patient given detailed instructions per Myocardial Perfusion Study Information Sheet for the test on 06/25/2023 at 10:00. Patient notified to arrive 15 minutes early and that it is imperative to arrive on time for appointment to keep from having the test rescheduled.  If you need to cancel or reschedule your appointment, please call the office within 24 hours of your appointment. . Patient verbalized understanding.Margaret Hobbs

## 2023-06-25 ENCOUNTER — Ambulatory Visit (HOSPITAL_COMMUNITY): Payer: Medicare Other | Attending: Internal Medicine

## 2023-06-25 DIAGNOSIS — I480 Paroxysmal atrial fibrillation: Secondary | ICD-10-CM | POA: Insufficient documentation

## 2023-06-25 DIAGNOSIS — I25119 Atherosclerotic heart disease of native coronary artery with unspecified angina pectoris: Secondary | ICD-10-CM | POA: Diagnosis present

## 2023-06-25 LAB — MYOCARDIAL PERFUSION IMAGING
LV dias vol: 54 mL (ref 46–106)
LV sys vol: 22 mL
Nuc Stress EF: 60 %
Peak HR: 93 {beats}/min
Rest HR: 71 {beats}/min
Rest Nuclear Isotope Dose: 11 mCi
SDS: 0
SRS: 0
SSS: 0
ST Depression (mm): 0 mm
Stress Nuclear Isotope Dose: 33 mCi
TID: 1.05

## 2023-06-25 MED ORDER — REGADENOSON 0.4 MG/5ML IV SOLN
0.4000 mg | Freq: Once | INTRAVENOUS | Status: AC
Start: 2023-06-25 — End: ?

## 2023-06-25 MED ORDER — TECHNETIUM TC 99M TETROFOSMIN IV KIT: 33.0000 | PACK | Freq: Once | INTRAVENOUS | Status: AC | PRN

## 2023-06-25 MED ORDER — TECHNETIUM TC 99M TETROFOSMIN IV KIT
11.0000 | PACK | Freq: Once | INTRAVENOUS | Status: AC | PRN
  Administered 2023-06-25: 11 via INTRAVENOUS

## 2023-06-27 NOTE — Progress Notes (Signed)
Pt has been made aware of normal result and verbalized understanding.  jw

## 2023-07-05 NOTE — Progress Notes (Unsigned)
Office Visit Note  Patient: Margaret Hobbs             Date of Birth: July 26, 1951           MRN: [id number]             PCP: Cleatis Polka., MD Referring: Cleatis Polka., MD Visit Date: 07/19/2023 Occupation: @GUAROCC @  Subjective:  Medication monitoring   History of Present Illness: Margaret Hobbs is a 72 y.o. female with history of psoriatic arthritis and osteoarthritis.  Patient remains on Humira 40 mg sq injections every 14 days.  She is tolerating Humira without any side effects or injection site reactions.  She denies any signs or symptoms of a flare recently.  She has noticed some increased dryness on her left elbow and has been using a body lotion but has not noticed any improvement.  She continues to have some psoriasis behind both ears especially her left ear.  She uses cortisone 10 cream on it topically which is helpful.  She denies any Achilles tendinitis or plantar fasciitis.  She denies any SI joint pain.  Patient continues to [place] on a regular basis working with place.  She tries to keep her fine motor skills but at times has some increased pain in her right second MCP joint.  She denies any joint swelling at this time.  She denies any recent or recurrent infections.    Activities of Daily Living:  Patient reports morning stiffness for 0 minutes.   Patient Denies nocturnal pain.  Difficulty dressing/grooming: Reports Difficulty climbing stairs: Reports Difficulty getting out of chair: Reports Difficulty using hands for taps, buttons, cutlery, and/or writing: Denies  Review of Systems  Constitutional:  Negative for fatigue.  HENT:  Negative for mouth sores, mouth dryness and nose dryness.   Eyes:  Negative for pain, visual disturbance and dryness.  Respiratory:  Negative for cough, hemoptysis, shortness of breath and difficulty breathing.   Cardiovascular:  Negative for chest pain, palpitations, hypertension and swelling in legs/feet.   Gastrointestinal:  Positive for diarrhea. Negative for abdominal pain, blood in stool and constipation.  Endocrine: Negative for increased urination.  Genitourinary:  Negative for painful urination.  Musculoskeletal:  Positive for joint pain and joint pain. Negative for joint swelling, myalgias, muscle weakness, morning stiffness, muscle tenderness and myalgias.  Skin:  Negative for color change, pallor, rash, hair loss, nodules/bumps, skin tightness, ulcers and sensitivity to sunlight.  Allergic/Immunologic: Negative for susceptible to infections.  Neurological:  Positive for dizziness. Negative for numbness, headaches and weakness.  Hematological:  Negative for swollen glands.  Psychiatric/Behavioral:  Positive for sleep disturbance. Negative for depressed mood. The patient is not nervous/anxious.     PMFS History:  Patient Active Problem List   Diagnosis Date Noted  . PVC's (premature ventricular contractions) 06/15/2023  . Paroxysmal atrial fibrillation (HCC) 06/14/2023  . Posterior vitreous detachment of both eyes 05/04/2022  . Other specified counseling 03/10/2022  . Epistaxis 11/29/2021  . Hypercoagulable state (HCC) 11/29/2021  . Morbid obesity (HCC) 11/29/2021  . Pseudophakia of both eyes 04/28/2021  . Claudication in peripheral vascular disease (HCC) 03/31/2021  . Chronic kidney disease due to hypertension 05/18/2020  . 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 Round hole of right eye 05/06/2020  . Severe nonproliferative diabetic retinopathy of left eye (HCC) 05/06/2020  . Retinal hemorrhage of left eye 05/06/2020  . Peripheral arterial disease (HCC) 10/17/2019  . 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  . Carotid  artery disease (HCC) 01/24/2017  . Primary osteoarthritis of both knees 11/29/2016  . Other psoriasis 11/28/2016  . Psoriatic arthritis (HCC) 11/28/2016  . High risk medication use 11/28/2016  . Chronic kidney disease, stage 3a  (HCC) 11/25/2012  . 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  . 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 Carotid artery occlusion 10/19/2009  . Age-related osteoporosis without current pathological fracture 10/19/2009  . 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 Type 2 diabetes mellitus (HCC) 03/30/2009  . Hyperlipidemia 03/30/2009  . Dyslipidemia 03/30/2009  . Essential hypertension 03/30/2009  . History of MI (myocardial infarction) 03/30/2009  . Coronary artery disease involving native coronary artery of native heart with angina pectoris (HCC) 03/30/2009  . Cerebral artery occlusion with cerebral infarction (HCC) 03/30/2009  . URINARY TRACT INFECTION 03/30/2009  . ARTHRITIS 03/30/2009  . HYPERPARATHYROIDISM, HX OF 03/30/2009    Past Medical History:  Diagnosis Date  . Aphasia   . Arthritis   . CAD (coronary artery disease)   . Cerebrovascular disease   . CVA (cerebral vascular accident) (HCC)   . Depressed   . Diabetes mellitus   . DM2 (diabetes mellitus, type 2) (HCC)   . HLD (hyperlipidemia)   . HTN (hypertension)   . Hypercholesteremia   . Hyperparathyroidism   . MI (myocardial infarction) (HCC)   . Osteoporosis   . TIA (transient ischemic attack)   . UTI (urinary tract infection)     Family History  Problem Relation Age of Onset  . Heart disease Mother        pacemaker  . Hypertension Mother   . Hyperlipidemia Mother   . Diabetes Brother        pre-diabetic   . Hypertension Son   . Sleep apnea Son   . Diabetes Other   . Hypertension Other   . Alcohol abuse Other   . Coronary artery disease Other    Past Surgical History:  Procedure Laterality Date  . ABDOMINAL AORTOGRAM W/LOWER EXTREMITY Bilateral 03/31/2021   Procedure: ABDOMINAL AORTOGRAM W/LOWER EXTREMITY;  Surgeon: Runell Gess, MD;  Location: [facility] INVASIVE CV LAB;  Service: Cardiovascular;  Laterality: Bilateral;  . ABDOMINAL HYSTERECTOMY    . CATARACT  EXTRACTION W/PHACO Bilateral 2017   Dr. Alben Spittle  . hysterectomy - unknown type     Social History   Social History Narrative  . Not on file   Immunization History  Administered Date(s) Administered  . PFIZER(Purple Top)SARS-COV-2 Vaccination 02/16/2020, 03/08/2020, 11/05/2020     Objective: Vital Signs: BP 120/78 (BP Location: Left Arm, Patient Position: Sitting, Cuff Size: Large)   Pulse 76   Resp 15   Ht 5\' 2"  (1.575 m)   Wt 198 lb (89.8 kg)   BMI 36.21 kg/m    Physical Exam Vitals and nursing note reviewed.  Constitutional:      Appearance: She is well-developed.  HENT:     Head: Normocephalic and atraumatic.  Eyes:     Conjunctiva/sclera: Conjunctivae normal.  Cardiovascular:     Rate and Rhythm: Normal rate and regular rhythm.     Heart sounds: Normal heart sounds.  Pulmonary:     Effort: Pulmonary effort is normal.     Breath sounds: Normal breath sounds.  Abdominal:     General: Bowel sounds are normal.     Palpations: Abdomen is soft.  Musculoskeletal:     Cervical back: Normal range of motion.  Skin:    General: Skin is warm and  dry.     Capillary Refill: Capillary refill takes less than 2 seconds.  Neurological:     Mental Status: She is alert and oriented to person, place, and time.  Psychiatric:        Behavior: Behavior normal.     Musculoskeletal Exam: C-spine good ROM.  Thoracic kyphosis noted.  No midline spinal tenderness.  No SI joint tenderness.  Shoulder joints have limited range of motion especially with internal rotation.  Elbow joints, wrist joints, MCPs, PIPs, DIPs have good range of motion with no synovitis.  Complete fist formation bilaterally.  Mild tenderness over the right second MCP joint.  Complete fist formation bilaterally.  PIP and DIP prominence noted.  Knee joints have good range of motion with no warmth or effusion.  Ankle joints have good range of motion with no tenderness or joint swelling.  No evidence of Achilles tendinitis or  plantar fasciitis.  CDAI Exam: CDAI Score: -- Patient Global: --; Provider Global: -- Swollen: --; Tender: -- Joint Exam 07/19/2023   No joint exam has been documented for this visit   There is currently no information documented on the homunculus. Go to the Rheumatology activity and complete the homunculus joint exam.  Investigation: No additional findings.  Imaging: MYOCARDIAL PERFUSION IMAGING  Result Date: 06/25/2023 .  Lexiscan stress shows no EKG changes .  Myoview scan shows normal perfusion.  NO ischemia or scar .  LVEF on gating calculated at 60% with normal wall motion .  OVerall low risk study .  Prior study available for comparison from 07/28/2021.  No change    Recent Labs: Lab Results  Component Value Date   WBC 5.7 06/08/2023   HGB 12.9 06/08/2023   PLT 229 06/08/2023   NA 139 06/08/2023   K 3.0 (L) 06/08/2023   CL 110 06/08/2023   CO2 19 (L) 06/08/2023   GLUCOSE 145 (H) 06/08/2023   BUN 21 06/08/2023   CREATININE 0.86 06/08/2023   BILITOT 0.4 07/11/2022   ALKPHOS 37 (L) 09/17/2021   AST 20 07/11/2022   ALT 19 07/11/2022   PROT 7.0 07/11/2022   ALBUMIN 3.4 (L) 09/17/2021   CALCIUM 7.2 (L) 06/08/2023   GFRAA 69 07/01/2019   QFTBGOLDPLUS NEGATIVE 07/11/2022    Speciality Comments: No specialty comments available.  Procedures:  No procedures performed Allergies: Remicade [infliximab], Amiodarone, Dapagliflozin, Semaglutide, and Sulfa antibiotics   Assessment / Plan:     Visit Diagnoses: Psoriatic arthritis (HCC): She has no synovitis or dactylitis on examination today.  She has not had any signs or symptoms of a flare.  She has clinically been doing well on Humira 40 mg subcutaneous injections every 14 days.  She is tolerating Humira without any side effects or injection site reactions.  She has not had any Achilles tendinitis or plantar fasciitis.  No SI joint tenderness upon palpation.  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  She has occasional pain in the right second MCP joint but has no inflammation on examination today.  She will remain on Humira as monotherapy.  She was advised to notify us if she develops signs or symptoms of a flare.  She will follow-up in the office in 5 months or sooner if needed.  Other psoriasis: Psoriasis behind both the ears especially the left ear.  Patch of dryness noted on the extensor surface of the left elbow.  Patient uses cortisone 10 cream available over-the-counter which she  finds to be helpful.  I also discussed the use of Aquaphor or Eucerin as a moisturizer instead of a body lotion with fragrance.  She will remain on Humira as prescribed.   High risk medication use - Humira 40 mg sq injections every 14 days. TB gold negative on 07/11/22.  TB Gold order released today. BMP updated on 06/08/23. CBC updated on 06/08/23.  Orders for CBC and CMP were released today.  Her next lab work will be due in October and every 3 months to monitor for drug toxicity. No recent or recurrent infections.  Discussed the importance of holding humira if she develops signs or symptoms of an infection and to resume once the infection has completely cleared.  Discussed the importance of yearly skin examinations while on Humira due to the increased risk for non-melanoma skin cancers.   - Plan: QuantiFERON-TB Gold Plus, COMPLETE METABOLIC PANEL WITH GFR, CBC with Differential/Platelet  Screening for tuberculosis -Order for TB gold released today.  Plan: QuantiFERON-TB Gold Plus  Primary osteoarthritis of both knees: She has good range of motion of both knee joints on examination today.  No warmth or effusion noted.  Age-related osteoporosis without current pathological fracture - Followed by Dr. Clelia Croft.  Taking vitamin D 1000 units twice daily.  Other medical conditions are listed as follows:   History of stroke  History of atrial fibrillation - - History of PAD with  symptomatic left calf claudication s/p angiography.  She is followed by Dr. Eden Emms  History of diabetes mellitus  Dyslipidemia  History of hypercholesterolemia  History of hypertension  History of coronary artery disease  Cerebral artery occlusion with cerebral infarction [facility])   Orders: Orders Placed This Encounter  Procedures  . QuantiFERON-TB Gold Plus  . COMPLETE METABOLIC PANEL WITH GFR  . CBC with Differential/Platelet   No orders of the defined types were placed in this encounter.   Follow-Up Instructions: Return in about 5 months (around 12/19/2023) for Psoriatic arthritis.   Gearldine Bienenstock, PA-C  Note - This record has been created using Dragon software.  Chart creation errors have been sought, but may not always  have been located. Such creation errors do not reflect on  the standard of medical care.

## 2023-07-19 ENCOUNTER — Ambulatory Visit: Payer: Medicare Other | Attending: Physician Assistant | Admitting: Physician Assistant

## 2023-07-19 ENCOUNTER — Encounter: Payer: Self-pay | Admitting: Physician Assistant

## 2023-07-19 VITALS — BP 120/78 | HR 76 | Resp 15 | Ht 62.0 in | Wt 198.0 lb

## 2023-07-19 DIAGNOSIS — Z8673 Personal history of transient ischemic attack (TIA), and cerebral infarction without residual deficits: Secondary | ICD-10-CM

## 2023-07-19 DIAGNOSIS — Z8639 Personal history of other endocrine, nutritional and metabolic disease: Secondary | ICD-10-CM

## 2023-07-19 DIAGNOSIS — M170 Bilateral primary osteoarthritis of knee: Secondary | ICD-10-CM | POA: Diagnosis not present

## 2023-07-19 DIAGNOSIS — Z79899 Other long term (current) drug therapy: Secondary | ICD-10-CM | POA: Diagnosis not present

## 2023-07-19 DIAGNOSIS — Z8679 Personal history of other diseases of the circulatory system: Secondary | ICD-10-CM

## 2023-07-19 DIAGNOSIS — L408 Other psoriasis: Secondary | ICD-10-CM | POA: Diagnosis not present

## 2023-07-19 DIAGNOSIS — Z111 Encounter for screening for respiratory tuberculosis: Secondary | ICD-10-CM

## 2023-07-19 DIAGNOSIS — I6350 Cerebral infarction due to unspecified occlusion or stenosis of unspecified cerebral artery: Secondary | ICD-10-CM

## 2023-07-19 DIAGNOSIS — E785 Hyperlipidemia, unspecified: Secondary | ICD-10-CM

## 2023-07-19 DIAGNOSIS — L4050 Arthropathic psoriasis, unspecified: Secondary | ICD-10-CM | POA: Diagnosis not present

## 2023-07-19 DIAGNOSIS — M810 Age-related osteoporosis without current pathological fracture: Secondary | ICD-10-CM

## 2023-07-19 LAB — CBC WITH DIFFERENTIAL/PLATELET
Absolute Monocytes: 513 cells/uL (ref 200–950)
Basophils Absolute: 53 cells/uL (ref 0–200)
Basophils Relative: 0.9 %
Eosinophils Absolute: 271 cells/uL (ref 15–500)
Eosinophils Relative: 4.6 %
HCT: 39 % (ref 35.0–45.0)
Hemoglobin: 12.6 g/dL (ref 11.7–15.5)
Lymphs Abs: 1446 cells/uL (ref 850–3900)
MCH: 30.2 pg (ref 27.0–33.0)
MCHC: 32.3 g/dL (ref 32.0–36.0)
MCV: 93.5 fL (ref 80.0–100.0)
MPV: 13.8 fL — ABNORMAL HIGH (ref 7.5–12.5)
Monocytes Relative: 8.7 %
Neutro Abs: 3617 cells/uL (ref 1500–7800)
Neutrophils Relative %: 61.3 %
Platelets: 229 10*3/uL (ref 140–400)
RBC: 4.17 10*6/uL (ref 3.80–5.10)
RDW: 11.9 % (ref 11.0–15.0)
Total Lymphocyte: 24.5 %
WBC: 5.9 10*3/uL (ref 3.8–10.8)

## 2023-07-19 NOTE — Patient Instructions (Addendum)
Aquaphor and eucerin    Standing Labs We placed an order today for your standing lab work.   Please have your standing labs drawn in October and every 3 months   Please have your labs drawn 2 weeks prior to your appointment so that the provider can discuss your lab results at your appointment, if possible.  Please note that you may see your imaging and lab results in MyChart before we have reviewed them. We will contact you once all results are reviewed. Please allow our office up to 72 hours to thoroughly review all of the results before contacting the office for clarification of your results.  WALK-IN LAB HOURS  Monday through Thursday from 8:00 am -12:30 pm and 1:00 pm-5:00 pm and Friday from 8:00 am-12:00 pm.  Patients with office visits requiring labs will be seen before walk-in labs.  You may encounter longer than normal wait times. Please allow additional time. Wait times may be shorter on  Monday and Thursday afternoons.  We do not book appointments for walk-in labs. We appreciate your patience and understanding with our staff.   Labs are drawn by [facility]. Please bring your co-pay at the time of your lab draw.  You may receive a bill from [facility] for your lab work.  Please note if you are on Hydroxychloroquine and and an order has been placed for a Hydroxychloroquine level,  you will need to have it drawn 4 hours or more after your last dose.  If you wish to have your labs drawn at another location, please call the office 24 hours in advance so we can fax the orders.  The office is located at [address], [place], [place] [address]   If you have any questions regarding directions or hours of operation,  please call [phone].   As a reminder, please drink plenty of water prior to coming for your lab work. Thanks!

## 2023-07-20 NOTE — Progress Notes (Signed)
CBC and CMP stable. Glucose elevated-255.

## 2023-07-21 LAB — COMPLETE METABOLIC PANEL WITH GFR: ALT: 16 U/L (ref 6–29)

## 2023-07-22 NOTE — Progress Notes (Signed)
TB gold negative

## 2023-08-10 ENCOUNTER — Other Ambulatory Visit: Payer: Self-pay | Admitting: Physician Assistant

## 2023-08-10 NOTE — Telephone Encounter (Signed)
Last Fill: 06/11/2023  Labs: 07/19/2023 CBC and CMP stable. Glucose elevated-255.     TB Gold: 07/19/2023 Neg    Next Visit: 01/10/2024  Last Visit: 07/19/2023  [id number] arthritis   Current Dose per office note 07/19/2023: Humira 40 mg sq injections every 14 days   Okay to refill Humira?

## 2023-08-20 ENCOUNTER — Ambulatory Visit: Payer: Medicare Other | Admitting: Podiatry

## 2023-08-20 DIAGNOSIS — M79675 Pain in left toe(s): Secondary | ICD-10-CM | POA: Diagnosis not present

## 2023-08-20 DIAGNOSIS — R21 Rash and other nonspecific skin eruption: Secondary | ICD-10-CM | POA: Diagnosis not present

## 2023-08-20 DIAGNOSIS — M79674 Pain in right toe(s): Secondary | ICD-10-CM | POA: Diagnosis not present

## 2023-08-20 DIAGNOSIS — E1151 Type 2 diabetes mellitus with diabetic peripheral angiopathy without gangrene: Secondary | ICD-10-CM | POA: Diagnosis not present

## 2023-08-20 DIAGNOSIS — B351 Tinea unguium: Secondary | ICD-10-CM | POA: Diagnosis not present

## 2023-08-20 MED ORDER — CLOTRIMAZOLE-BETAMETHASONE 1-0.05 % EX CREA: 1.0000 | TOPICAL_CREAM | Freq: Every day | CUTANEOUS | 0 refills | Status: AC

## 2023-08-20 NOTE — Progress Notes (Signed)
Subjective:  Patient ID: Margaret Hobbs, female    DOB: 1951-11-05,  MRN: [id number]  Margaret Hobbs presents to clinic today for:  Chief Complaint  Patient presents with   Diabetes    [facility] BS - DIDN'T CHECK IT, DONT CHECK IT EVERYDAY A1C - 7.2   Patient notes nails are thick and elongated, causing pain in shoe gear when ambulating.  Patient notes a little bit of rash on the top of the right second and third toes.  States there is occasional itching but nothing severe.  Denies any blister formation.  PCP is Cleatis Polka., MD. last visit was 08/07/2023  Past Medical History:  Diagnosis Date   Aphasia    Arthritis    CAD (coronary artery disease)    Cerebrovascular disease    CVA (cerebral vascular accident) (HCC)    Depressed    Diabetes mellitus    DM2 (diabetes mellitus, type 2) (HCC)    HLD (hyperlipidemia)    HTN (hypertension)    Hypercholesteremia    Hyperparathyroidism    MI (myocardial infarction) (HCC)    Osteoporosis    TIA (transient ischemic attack)    UTI (urinary tract infection)     Allergies  Allergen Reactions   Remicade [Infliximab] Other (See Comments)    Convulsion and felt cold    Amiodarone     Per pr report she was dizzy, weak and lethargic   Dapagliflozin     Other reaction(s): dysequilibrium, urinary frequency   Semaglutide Other (See Comments)    Caused fast heart rate and chest tightness   Sulfa Antibiotics Rash    Review of Systems: Negative except as noted in the HPI.  Objective:  There were no vitals filed for this visit.  Margaret Hobbs is a pleasant 73 y.o. female in NAD. AAO x 3.  Vascular Examination: Patient has palpable DP pulse, absent PT pulse bilateral.  Delayed capillary refill bilateral toes.  Sparse digital hair bilateral.  Proximal to distal cooling WNL bilateral.  +1 pitting edema bilateral  Dermatological Examination: Interspaces are clear with no open lesions noted bilateral.  Skin is  shiny and atrophic bilateral.  Nails are 3-59mm thick, with yellowish/brown discoloration, subungual debris and distal onycholysis x10.  There is pain with compression of nails x10.  There is localized minimal erythema with thickened skin to the dorsal aspect of the right second and third toes.  No vesicle formation is noted.  Patient qualifies for at-risk foot care because of diabetes with PVD.  Assessment/Plan: 1. Pain due to onychomycosis of toenails of both feet   2. Type II diabetes mellitus with peripheral circulatory disorder (HCC)   3. Rash of foot     Meds ordered this encounter  Medications   clotrimazole-betamethasone (LOTRISONE) cream    Sig: Apply 1 Application topically daily.    Dispense:  30 g    Refill:  0   Mycotic nails x10 were sharply debrided with sterile nail nippers and power debriding burr to decrease bulk and length.  A prescription for Lotrisone cream was sent to her pharmacy to put on the dorsal aspect of right second and third toes once to twice daily as needed until the area resolves.  Return for she is already scheduled with Dr. Eloy End in 3 months.   Clerance Lav, DPM, FACFAS [facility]     [address]  [place], [place] [address]                Office [phone]  Fax [phone]

## 2023-09-01 ENCOUNTER — Other Ambulatory Visit: Payer: Self-pay | Admitting: Cardiovascular Disease

## 2023-09-12 ENCOUNTER — Telehealth: Payer: Self-pay | Admitting: *Deleted

## 2023-09-12 NOTE — Telephone Encounter (Signed)
Received fax from [facility] stating they have been trying to reach patient to refill Humira. They have been unsuccessful in reaching patient. Call back number is [phone]. LEft message to advise to contact the pharmacy to set up shipment.

## 2023-09-16 NOTE — Progress Notes (Unsigned)
Cardiology Office Note:    Date:  09/17/2023  ID:  Margaret Hobbs, DOB 1951/10/02, MRN [id number] PCP: Cleatis Polka., MD  [facility] Providers Cardiologist:  Charlton Haws, MD Electrophysiologist:  Lanier Prude, MD       Patient Profile:      Coronary artery disease Inf STEMI in 12/2006 s/p BMS to RCA Cath 05/2007: pLAD 30, mLAD 60, dLAD 80, oD2 70, pRCA 60, mRCA 50, dRCA stent ok, prox post AV 95, EF 60 TTE 04/14/21: EF 60-65, no RWMA, Gr 1 DD, NL RVSF, triival MR, AV sclerosis, RAP 3 Myoview 07/28/21: no ischemia, EF 62, low risk  Myoview 06/25/2023: EF 60, no ischemia, low risk Atrial fibrillation  Developed during PV a-gram in 4/22 Event monitor 03/14/22: Freq PAF/Flutter, rare PVC, NSVT 6 beats Intol of Amiodarone (dizziness) Pt declined Tikosyn Diabetes mellitus  Hypertension  Hyperlipidemia  Intol of Atorvastatin  Carotid artery disease [place] 09/29/21: no sig ICA stenosis  Hx of TIA Peripheral arterial disease  Bilat SFA disease; Dr. Randie Heinz - [facility] Psoriatic arthritis           History of Present Illness:  Discussed the use of AI scribe software for clinical note transcription with the patient, who gave verbal consent to proceed.  Margaret Hobbs is a 72 y.o. female who returns for follow-up of CAD, A-fib.  She was last seen in June 2024.  She had been to the emergency room with chest pain and palpitations after starting on Rybelsus.  Follow-up nuclear stress test was low risk and negative for ischemia. She is here alone. She has been dealing with significant stress due to fraud/stolen identity issues, which has led to a lot of anxiety. However, she has not had any more chest symptoms since her last OV. She denies shortness of breath, passing out, or heart racing. She has noticed an increase in claudication in her left calf, particularly when walking for more than a few minutes.     ROS: See HPI.    Studies Reviewed:        Risk  Assessment/Calculations:    CHA2DS2-VASc Score = 7   This indicates a 11.2% annual risk of stroke. The patient's score is based upon: CHF History: 0 HTN History: 1 Diabetes History: 1 Stroke History: 2 Vascular Disease History: 1 Age Score: 1 Gender Score: 1            Physical Exam:   VS:  BP 121/60   Pulse 74   Ht 5\' 2"  (1.575 m)   Wt 199 lb 12.8 oz (90.6 kg)   SpO2 98%   BMI 36.54 kg/m    Wt Readings from Last 3 Encounters:  09/17/23 199 lb 12.8 oz (90.6 kg)  07/19/23 198 lb (89.8 kg)  06/25/23 195 lb (88.5 kg)    Constitutional:      Appearance: Healthy appearance. Not in distress.  Neck:     Vascular: No JVR. JVD normal.  Pulmonary:     Breath sounds: Normal breath sounds. No wheezing. No rales.  Cardiovascular:     Normal rate. Regular rhythm.     Murmurs: There is no murmur.  Edema:    Peripheral edema absent.  Abdominal:     Palpations: Abdomen is soft.      Assessment and Plan:     Coronary Artery Disease (CAD) History of inferior STEMI in 2008 with BMS to RCA. She has known severe distal vessel disease and moderate non-obstructive disease elsewhere.  Nuclear  stress test in July 2024 was low risk and negative for ischemia.  She has not had recent chest discomfort to suggest angina.  -Continue current medications:Plavix 75mg  daily, Toprol XL 75mg  daily, Nitroglycerin PRN, Simvastatin 40mg  daily, Zetia 10mg  daily. -Follow-up 6 months  Paroxysmal Atrial Fibrillation Maintaining sinus rhythm on exam today.  She has been intolerant intolerant of amiodarone and declined dofetilide load in the past.  She has not had any recent symptoms of rapid palpitations. -Continue Eliquis 5mg  twice daily.  Peripheral Arterial Disease (PAD) She notes an increase in claudication in left leg (calf).  She had attempted peripheral vascular intervention in 2022 that was aborted with new onset atrial fibrillation.  She was ultimately referred to the vascular surgery and was last  seen by vascular surgery in 2023 with recommendation for conservative management unless symptoms changed. -Refer back to vascular surgery for re-evaluation due to increased claudication symptoms.  Hyperlipidemia LDL was near optimal at 60 in October 2023.  She has follow-up labs with primary care next month for her annual physical. -Continue Simvastatin 40mg  daily and Zetia 10mg  daily.  Hypertension Well controlled. -Continue Toprol XL 75mg  daily and Hydralazine 50mg  at bedtime.           Dispo:  Return in about 6 months (around 03/16/2024) for Routine Follow Up with Dr. Eden Emms.  Signed, Tereso Newcomer, PA-C

## 2023-09-17 ENCOUNTER — Ambulatory Visit: Payer: Medicare Other | Attending: Physician Assistant | Admitting: Physician Assistant

## 2023-09-17 ENCOUNTER — Encounter: Payer: Self-pay | Admitting: Physician Assistant

## 2023-09-17 VITALS — BP 121/60 | HR 74 | Ht 62.0 in | Wt 199.8 lb

## 2023-09-17 DIAGNOSIS — I739 Peripheral vascular disease, unspecified: Secondary | ICD-10-CM | POA: Diagnosis not present

## 2023-09-17 DIAGNOSIS — E7800 Pure hypercholesterolemia, unspecified: Secondary | ICD-10-CM

## 2023-09-17 DIAGNOSIS — I480 Paroxysmal atrial fibrillation: Secondary | ICD-10-CM | POA: Diagnosis not present

## 2023-09-17 DIAGNOSIS — I25119 Atherosclerotic heart disease of native coronary artery with unspecified angina pectoris: Secondary | ICD-10-CM

## 2023-09-17 DIAGNOSIS — I10 Essential (primary) hypertension: Secondary | ICD-10-CM | POA: Diagnosis not present

## 2023-09-17 NOTE — Patient Instructions (Signed)
Medication Instructions:  Your physician recommends that you continue on your current medications as directed. Please refer to the Current Medication list given to you today.  *If you need a refill on your cardiac medications before your next appointment, please call your pharmacy*   Lab Work: None ordered  If you have labs (blood work) drawn today and your tests are completely normal, you will receive your results only by: MyChart Message (if you have MyChart) OR A paper copy in the mail If you have any lab test that is abnormal or we need to change your treatment, we will call you to review the results.   Testing/Procedures: None ordered   Follow-Up: At [facility], you and your health needs are our priority.  As part of our continuing mission to provide you with exceptional heart care, we have created designated Provider Care Teams.  These Care Teams include your primary Cardiologist (physician) and Advanced Practice Providers (APPs -  Physician Assistants and Nurse Practitioners) who all work together to provide you with the care you need, when you need it.  We recommend signing up for the patient portal called "MyChart".  Sign up information is provided on this After Visit Summary.  MyChart is used to connect with patients for Virtual Visits (Telemedicine).  Patients are able to view lab/test results, encounter notes, upcoming appointments, etc.  Non-urgent messages can be sent to your provider as well.   To learn more about what you can do with MyChart, go to [email or web address].    Your next appointment:   6 month(s)  Provider:   Charlton Haws, MD     Other Instructions Dr. Darcella Cheshire office will reach out to you to schedule an appointment.

## 2023-10-09 ENCOUNTER — Telehealth: Payer: Self-pay | Admitting: *Deleted

## 2023-10-09 NOTE — Telephone Encounter (Signed)
Received fax from [facility] stating they have been trying to reach patient to refill Humira. They have been unsuccessful in reaching patient. Call back number is [phone]. Patient advised to contact the pharmacy to set up shipment.  Left message to advise patient.

## 2023-10-10 LAB — LAB REPORT - SCANNED
A1c: 8.1
EGFR: 61

## 2023-10-19 ENCOUNTER — Telehealth: Payer: Self-pay | Admitting: *Deleted

## 2023-10-19 NOTE — Telephone Encounter (Signed)
Labs received from:[facility]  Drawn on:10/09/2023  Reviewed by: Sherron Ales, PA-C  Labs drawn:CBC, Vitamin D, CMP, Hgb A1C, UA  Results: Glucose 212   BUN 29   BUN/Creat. Ratio 29   UA: WBC Esterase 1+  Patient on Humira 40 mh/0.4 mL SQ every 14 days.

## 2023-11-06 ENCOUNTER — Other Ambulatory Visit: Payer: Self-pay | Admitting: *Deleted

## 2023-11-06 DIAGNOSIS — I70212 Atherosclerosis of native arteries of extremities with intermittent claudication, left leg: Secondary | ICD-10-CM

## 2023-11-18 ENCOUNTER — Other Ambulatory Visit: Payer: Self-pay | Admitting: Rheumatology

## 2023-11-19 NOTE — Telephone Encounter (Signed)
Last Fill: 08/10/2023  Labs: 10/09/2023 Glucose 212   BUN 29   BUN/Creat. Ratio 29   UA: WBC Esterase 1  TB Gold: 07/19/2023 Neg    Next Visit: 01/10/2024  Last Visit: 07/19/2023  ZO:[id number] arthritis   Current Dose per office note 07/19/2023: Humira 40 mg sq injections every 14 days   Okay to refill Humira?

## 2023-11-21 ENCOUNTER — Ambulatory Visit: Payer: Medicare Other | Admitting: Physician Assistant

## 2023-11-21 ENCOUNTER — Ambulatory Visit (HOSPITAL_COMMUNITY)
Admission: RE | Admit: 2023-11-21 | Discharge: 2023-11-21 | Disposition: A | Discharge status: Home / Self Care | Payer: Medicare Other | Source: Ambulatory Visit | Attending: Vascular Surgery | Admitting: Vascular Surgery

## 2023-11-21 VITALS — BP 128/54 | HR 69 | Temp 97.7°F | Resp 18 | Ht 62.0 in | Wt 198.8 lb

## 2023-11-21 DIAGNOSIS — I70212 Atherosclerosis of native arteries of extremities with intermittent claudication, left leg: Secondary | ICD-10-CM | POA: Diagnosis present

## 2023-11-21 LAB — VAS US ABI WITH/WO TBI
Left ABI: 0.58
Right ABI: 1.01

## 2023-11-21 NOTE — Progress Notes (Signed)
Office Note   History of Present Illness   Margaret Hobbs is a 72 y.o. (February 24, 1951) female who presents for surveillance of PAD.  She has a history of short distance claudication in the left lower extremity.  She has previously tried to undergo angiography about a year ago that was complicated by atrial fibrillation and the procedure was stopped.  At this time she is trying to continue with conservative management.  She returns today for follow-up.  She denies any rest pain or tissue loss.  She states her claudication in the left calf is unchanged.  This still happens at short distances in the grocery store.  She states she does not want any procedures on her leg unless it is "necessary".   Current Outpatient Medications  Medication Sig Dispense Refill   amoxicillin (AMOXIL) 500 MG capsule Take 2,000 mg by mouth as directed. Take 4 capsules (2000 mg) by mouth 1 hour prior to dental procedures  0   Ascorbic Acid (VITAMIN C) 500 MG tablet Take 500 mg by mouth 3 (three) times a week.     cholecalciferol (VITAMIN D) 1000 UNITS tablet Take 1,000 Units by mouth 2 (two) times daily.     clopidogrel (PLAVIX) 75 MG tablet TAKE 1 TABLET BY MOUTH DAILY 90 tablet 2   clotrimazole-betamethasone (LOTRISONE) cream Apply 1 Application topically daily. 30 g 0   Coenzyme Q10 100 MG TABS Take 100 mg by mouth in the morning.     ELIQUIS 5 MG TABS tablet TAKE 1 TABLET BY MOUTH TWICE A DAY 180 tablet 1   ezetimibe (ZETIA) 10 MG tablet Take 1 tablet by mouth daily.     folic acid (FOLVITE) 1 MG tablet Take 1 tablet by mouth daily.     glipiZIDE-metformin (METAGLIP) 5-500 MG tablet Take 1 tablet by mouth 2 (two) times daily.     glucose blood (ONETOUCH VERIO) test strip 1 strip In Vitro daily for 30 days     HUMIRA, 2 SYRINGE, 40 MG/0.4ML prefilled syringe INJECT 1 SYRINGE SUBCUTANEOUSLY  EVERY OTHER WEEK 2 each 2   hydrALAZINE (APRESOLINE) 50 MG tablet Take 1 tablet (50 mg total) by mouth at bedtime. 90  tablet 2   Lancets (ONETOUCH DELICA PLUS LANCET33G) MISC Apply topically daily.     metoprolol succinate (TOPROL XL) 50 MG 24 hr tablet Take 1.5 tablets (75 mg total) by mouth daily. Take with or immediately following a meal. 135 tablet 3   nitroGLYCERIN (NITROSTAT) 0.4 MG SL tablet PLACE 1 TABLET UNDER THE TONGUE EVERY 5 (FIVE) MINUTES AS NEEDED FOR CHEST PAIN (3 DOSES MAX) 25 tablet 2   potassium chloride SA (KLOR-CON M) 20 MEQ tablet Take 20 mEq by mouth 2 (two) times daily.     simvastatin (ZOCOR) 40 MG tablet Take 1 tablet (40 mg total) by mouth at bedtime. 90 tablet 3   VITAMIN A PO Take 3,000 Units by mouth daily.     vitamin E 400 UNIT capsule Take 400 Units by mouth in the morning.     Current Facility-Administered Medications  Medication Dose Route Frequency Provider Last Rate Last Admin   sodium chloride flush (NS) 0.9 % injection 3 mL  3 mL Intravenous Q12H Runell Gess, MD       Facility-Administered Medications Ordered in Other Visits  Medication Dose Route Frequency Provider Last Rate Last Admin   regadenoson (LEXISCAN) injection SOLN 0.4 mg  0.4 mg Intravenous Once Pricilla Riffle, MD  technetium tetrofosmin (TC-MYOVIEW) injection 33 millicurie  33 millicurie Intravenous Once PRN Pricilla Riffle, MD        REVIEW OF SYSTEMS (negative unless checked):   Cardiac:  []  Chest pain or chest pressure? []  Shortness of breath upon activity? []  Shortness of breath when lying flat? []  Irregular heart rhythm?  Vascular:  [x]  Pain in calf, thigh, or hip brought on by walking? []  Pain in feet at night that wakes you up from your sleep? []  Blood clot in your veins? []  Leg swelling?  Pulmonary:  []  Oxygen at home? []  Productive cough? []  Wheezing?  Neurologic:  []  Sudden weakness in arms or legs? []  Sudden numbness in arms or legs? []  Sudden onset of difficult speaking or slurred speech? []  Temporary loss of vision in one eye? []  Problems with  dizziness?  Gastrointestinal:  []  Blood in stool? []  Vomited blood?  Genitourinary:  []  Burning when urinating? []  Blood in urine?  Psychiatric:  []  Major depression  Hematologic:  []  Bleeding problems? []  Problems with blood clotting?  Dermatologic:  []  Rashes or ulcers?  Constitutional:  []  Fever or chills?  Ear/Nose/Throat:  []  Change in hearing? []  Nose bleeds? []  Sore throat?  Musculoskeletal:  []  Back pain? []  Joint pain? []  Muscle pain?   Physical Examination   Vitals:   11/21/23 1440  BP: (!) 128/54  Pulse: 69  Resp: 18  Temp: 97.7 F (36.5 C)  TempSrc: Temporal  SpO2: 96%  Weight: 198 lb 12.8 oz (90.2 kg)  Height: 5\' 2"  (1.575 m)   Body mass index is 36.36 kg/m.  General:  WDWN in NAD; vital signs documented above Gait: Not observed HENT: WNL, normocephalic Pulmonary: normal non-labored breathing , without rales, rhonchi,  wheezing Cardiac: regular Abdomen: soft, NT, no masses Skin: without rashes Vascular Exam/Pulses: Palpable right PT pulse.  Monophasic left DP/PT Doppler signals Extremities: without ischemic changes, without gangrene , without cellulitis; without open wounds;  Musculoskeletal: no muscle wasting or atrophy  Neurologic: A&O X 3;  No focal weakness or paresthesias are detected Psychiatric:  The pt has Normal affect.  Non-Invasive Vascular imaging   ABI (11/21/2023) R:  ABI: 1.01 (0.92),  PT: bi DP: bi TBI: 0.64 L:  ABI: 0.58 (0.53),  PT: mono DP: mono TBI: 0.35   Medical Decision Making   Margaret Hobbs is a 72 y.o. female who presents for surveillance of PAD  Based on the patient's vascular studies, her ABIs are essentially unchanged bilaterally.  Her right ABI is 1.01 and left ABI 0.58 The patient has a history of short distance claudication in the left calf.  Her symptoms are unchanged from a year ago.  She denies any rest pain or tissue loss. On exam she has a palpable right PT pulse.  She has  nonpalpable pedal pulses on the left.  She has monophasic DP/PT Doppler signals on the left At this time the patient is comfortable continuing conservative management.  She would prefer not to have any procedures done on her leg unless it was necessary.  We can continue with her current medications and follow-up with 1 year with ABIs   Loel Dubonnet PA-C [facility] Office: [phone]  Clinic MD: Randie Heinz

## 2023-11-27 ENCOUNTER — Other Ambulatory Visit: Payer: Self-pay

## 2023-11-27 ENCOUNTER — Ambulatory Visit: Payer: Medicare Other | Admitting: Podiatry

## 2023-11-27 DIAGNOSIS — I70212 Atherosclerosis of native arteries of extremities with intermittent claudication, left leg: Secondary | ICD-10-CM

## 2023-12-11 ENCOUNTER — Encounter: Payer: Self-pay | Admitting: Podiatry

## 2023-12-11 ENCOUNTER — Ambulatory Visit: Payer: Medicare Other | Admitting: Podiatry

## 2023-12-11 DIAGNOSIS — E1151 Type 2 diabetes mellitus with diabetic peripheral angiopathy without gangrene: Secondary | ICD-10-CM

## 2023-12-11 DIAGNOSIS — M79675 Pain in left toe(s): Secondary | ICD-10-CM

## 2023-12-11 DIAGNOSIS — B351 Tinea unguium: Secondary | ICD-10-CM | POA: Diagnosis not present

## 2023-12-11 DIAGNOSIS — M79674 Pain in right toe(s): Secondary | ICD-10-CM

## 2023-12-20 NOTE — Progress Notes (Signed)
  Subjective:  Patient ID: Margaret Hobbs, female    DOB: 06/12/1951,  MRN: [id number]  72 y.o. female presents with at risk foot care. Pt has h/o NIDDM with PAD and painful thick toenails that are difficult to trim. Pain interferes with ambulation. Aggravating factors include wearing enclosed shoe gear. Pain is relieved with periodic professional debridement. Chief Complaint  Patient presents with   Nail Problem    Patient last seen her pcp a couple months ago patient states     PCP: Cleatis Polka., MD.  New problem(s): None.   Review of Systems: Negative except as noted in the HPI.   Allergies  Allergen Reactions   Remicade [Infliximab] Other (See Comments)    Convulsion and felt cold    Amiodarone     Per pr report she was dizzy, weak and lethargic   Dapagliflozin     Other reaction(s): dysequilibrium, urinary frequency   Semaglutide Other (See Comments)    Caused fast heart rate and chest tightness   Sulfa Antibiotics Rash    Objective:  There were no vitals filed for this visit. Constitutional Patient is a pleasant 72 y.o. female in NAD. AAO x 3.  Vascular Capillary fill time to digits <3 seconds.  DP/PT pulse(s) are faintly palpable b/l lower extremities. Pedal hair absent b/l. Lower extremity skin temperature gradient warm to cool b/l. No pain with calf compression b/l. No cyanosis or clubbing noted. No ischemia nor gangrene noted b/l.   Neurologic Protective sensation intact 5/5 intact bilaterally with 10g monofilament b/l. Vibratory sensation intact b/l. No clonus b/l.   Dermatologic Pedal skin is thin, shiny and atrophic b/l.  No open wounds b/l lower extremities. No interdigital macerations b/l lower extremities. Toenails 1-5 b/l elongated, discolored, dystrophic, thickened, crumbly with subungual debris and tenderness to dorsal palpation. No corns, calluses nor porokeratotic lesions noted.  Orthopedic: Muscle strength 5/5 to all LE muscle groups of left foot.  Muscle strength 3/5 to all LE muscle groups of right foot.   Last HgA1c:      No data to display           Assessment:   1. Pain due to onychomycosis of toenails of both feet   2. Type II diabetes mellitus with peripheral circulatory disorder [facility])    Plan:  Patient was evaluated and treated and all questions answered. Consent given for treatment as described below: -Continue foot and shoe inspections daily. Monitor blood glucose per PCP/Endocrinologist's recommendations. -Continue supportive shoe gear daily. -Mycotic toenails 1-5 bilaterally were debrided in length and girth with sterile nail nippers and dremel without incident. -Patient/POA to call should there be question/concern in the interim.  Return in about 3 months (around 03/10/2024).  Freddie Breech, DPM      [facility] LOCATION: [address] [place] [address]                   Office [phone]   [facility] LOCATION: [address] [place], [place] [address] Office [phone]

## 2023-12-27 NOTE — Progress Notes (Deleted)
 Office Visit Note  Patient: Margaret Hobbs             Date of Birth: [birth year]           MRN: [id number]             PCP: Cleatis Polka., MD Referring: Cleatis Polka., MD Visit Date: 01/10/2024 Occupation: @GUAROCC @  Subjective:  No chief complaint on file.   History of Present Illness: Margaret Hobbs is a 73 y.o. female ***     Activities of Daily Living:  Patient reports morning stiffness for *** {minute/hour:19697}.   Patient {ACTIONS;DENIES/REPORTS:21021675::"Denies"} nocturnal pain.  Difficulty dressing/grooming: {ACTIONS;DENIES/REPORTS:21021675::"Denies"} Difficulty climbing stairs: {ACTIONS;DENIES/REPORTS:21021675::"Denies"} Difficulty getting out of chair: {ACTIONS;DENIES/REPORTS:21021675::"Denies"} Difficulty using hands for taps, buttons, cutlery, and/or writing: {ACTIONS;DENIES/REPORTS:21021675::"Denies"}  No Rheumatology ROS completed.   PMFS History:  Patient Active Problem List   Diagnosis Date Noted   PVC's (premature ventricular contractions) 06/15/2023   Paroxysmal atrial fibrillation (HCC) 06/14/2023   Posterior vitreous detachment of both eyes 05/04/2022   Other specified counseling 03/10/2022   Epistaxis 11/29/2021   Hypercoagulable state (HCC) 11/29/2021   Morbid obesity (HCC) 11/29/2021   Pseudophakia of both eyes 04/28/2021   Claudication in peripheral vascular disease (HCC) 03/31/2021   Chronic kidney disease due to hypertension 05/18/2020   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) 05/06/2020   Round hole of right eye 05/06/2020   Severe nonproliferative diabetic retinopathy of left eye (HCC) 05/06/2020   Retinal hemorrhage of left eye 05/06/2020   Peripheral arterial disease (HCC) 10/17/2019   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   Carotid artery disease (HCC) 01/24/2017   Primary osteoarthritis of both knees 11/29/2016   Other psoriasis 11/28/2016   Psoriatic arthritis (HCC) 11/28/2016   High  risk medication use 11/28/2016   Chronic kidney disease, stage 3a (HCC) 11/25/2012   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/29/2009   Carotid artery occlusion 10/19/2009   Age-related osteoporosis without current pathological fracture 10/19/2009   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   Type 2 diabetes mellitus (HCC) 03/30/2009   Hyperlipidemia 03/30/2009   Dyslipidemia 03/30/2009   Essential hypertension 03/30/2009   History of MI (myocardial infarction) 03/30/2009   Coronary artery disease involving native coronary artery of native heart with angina pectoris (HCC) 03/30/2009   Cerebral artery occlusion with cerebral infarction (HCC) 03/30/2009   URINARY TRACT INFECTION 03/30/2009   Arthropathy 03/30/2009   HYPERPARATHYROIDISM, HX OF 03/30/2009    Past Medical History:  Diagnosis Date   Aphasia    Arthritis    CAD (coronary artery disease)    Cerebrovascular disease    CVA (cerebral vascular accident) (HCC)    Depressed    Diabetes mellitus    DM2 (diabetes mellitus, type 2) (HCC)    HLD (hyperlipidemia)    HTN (hypertension)    Hypercholesteremia    Hyperparathyroidism    MI (myocardial infarction) (HCC)    Osteoporosis    TIA (transient ischemic attack)    UTI (urinary tract infection)     Family History  Problem Relation Age of Onset   Heart disease Mother        pacemaker   Hypertension Mother    Hyperlipidemia Mother    Diabetes Brother        pre-diabetic    Hypertension Son    Sleep apnea Son    Diabetes Other    Hypertension Other    Alcohol abuse Other  Coronary artery disease Other    Past Surgical History:  Procedure Laterality Date   ABDOMINAL AORTOGRAM W/LOWER EXTREMITY Bilateral 03/31/2021   Procedure: ABDOMINAL AORTOGRAM W/LOWER EXTREMITY;  Surgeon: Runell Gess, MD;  Location: [facility];  Service: Cardiovascular;  Laterality: Bilateral;   ABDOMINAL  HYSTERECTOMY     CATARACT EXTRACTION W/PHACO Bilateral 2017   Dr. Alben Spittle   hysterectomy - unknown type     Social History   Social History Narrative   Not on file   Immunization History  Administered Date(s) Administered   PFIZER(Purple Top)SARS-COV-2 Vaccination 02/16/2020, 03/08/2020, 11/05/2020     Objective: Vital Signs: There were no vitals taken for this visit.   Physical Exam   Musculoskeletal Exam: ***  CDAI Exam: CDAI Score: -- Patient Global: --; Provider Global: -- Swollen: --; Tender: -- Joint Exam 01/10/2024   No joint exam has been documented for this visit   There is currently no information documented on the homunculus. Go to the Rheumatology activity and complete the homunculus joint exam.  Investigation: No additional findings.  Imaging: No results found.  Recent Labs: Lab Results  Component Value Date   WBC 5.9 07/19/2023   HGB 12.6 07/19/2023   PLT 229 07/19/2023   NA 141 07/19/2023   K 3.7 07/19/2023   CL 101 07/19/2023   CO2 30 07/19/2023   GLUCOSE 255 (H) 07/19/2023   BUN 37 (H) 07/19/2023   CREATININE 0.98 07/19/2023   BILITOT 0.4 07/19/2023   ALKPHOS 37 (L) 09/17/2021   AST 14 07/19/2023   ALT 16 07/19/2023   PROT 6.8 07/19/2023   ALBUMIN 3.4 (L) 09/17/2021   CALCIUM 10.0 07/19/2023   GFRAA 69 07/01/2019   QFTBGOLDPLUS NEGATIVE 07/19/2023    Speciality Comments: No specialty comments available.  Procedures:  No procedures performed Allergies: Remicade [infliximab], Amiodarone, Dapagliflozin, Semaglutide, and Sulfa antibiotics   Assessment / Plan:     Visit Diagnoses: No diagnosis found.  Orders: No orders of the defined types were placed in this encounter.  No orders of the defined types were placed in this encounter.   Face-to-face time spent with patient was *** minutes. Greater than 50% of time was spent in counseling and coordination of care.  Follow-Up Instructions: No follow-ups on file.   Pollyann Savoy, MD  Note - This record has been created using Animal nutritionist.  Chart creation errors have been sought, but may not always  have been located. Such creation errors do not reflect on  the standard of medical care.

## 2024-01-09 ENCOUNTER — Ambulatory Visit: Payer: Medicare Other | Attending: Rheumatology | Admitting: Rheumatology

## 2024-01-09 ENCOUNTER — Encounter: Payer: Self-pay | Admitting: Rheumatology

## 2024-01-09 VITALS — BP 144/52 | HR 78 | Resp 16 | Ht 62.0 in | Wt 199.6 lb

## 2024-01-09 DIAGNOSIS — Z79899 Other long term (current) drug therapy: Secondary | ICD-10-CM | POA: Diagnosis not present

## 2024-01-09 DIAGNOSIS — Z8673 Personal history of transient ischemic attack (TIA), and cerebral infarction without residual deficits: Secondary | ICD-10-CM

## 2024-01-09 DIAGNOSIS — L4050 Arthropathic psoriasis, unspecified: Secondary | ICD-10-CM | POA: Diagnosis not present

## 2024-01-09 DIAGNOSIS — E785 Hyperlipidemia, unspecified: Secondary | ICD-10-CM

## 2024-01-09 DIAGNOSIS — Z8679 Personal history of other diseases of the circulatory system: Secondary | ICD-10-CM

## 2024-01-09 DIAGNOSIS — M170 Bilateral primary osteoarthritis of knee: Secondary | ICD-10-CM

## 2024-01-09 DIAGNOSIS — L408 Other psoriasis: Secondary | ICD-10-CM | POA: Diagnosis not present

## 2024-01-09 DIAGNOSIS — M810 Age-related osteoporosis without current pathological fracture: Secondary | ICD-10-CM

## 2024-01-09 DIAGNOSIS — Z8639 Personal history of other endocrine, nutritional and metabolic disease: Secondary | ICD-10-CM

## 2024-01-09 NOTE — Patient Instructions (Signed)
 Standing Labs We placed an order today for your standing lab work.   Please have your standing labs drawn in January and every 3 months Please get following labs in January (CBC with differential, CMP with GFR and fasting lipid panel)  Please have your labs drawn 2 weeks prior to your appointment so that the provider can discuss your lab results at your appointment, if possible.  Please note that you may see your imaging and lab results in MyChart before we have reviewed them. We will contact you once all results are reviewed. Please allow our office up to 72 hours to thoroughly review all of the results before contacting the office for clarification of your results.  WALK-IN LAB HOURS  Monday through Thursday from 8:00 am -12:30 pm and 1:00 pm-5:00 pm and Friday from 8:00 am-12:00 pm.  Patients with office visits requiring labs will be seen before walk-in labs.  You may encounter longer than normal wait times. Please allow additional time. Wait times may be shorter on  Monday and Thursday afternoons.  We do not book appointments for walk-in labs. We appreciate your patience and understanding with our staff.   Labs are drawn by [facility]. Please bring your co-pay at the time of your lab draw.  You may receive a bill from [facility] for your lab work.  Please note if you are on Hydroxychloroquine and and an order has been placed for a Hydroxychloroquine level,  you will need to have it drawn 4 hours or more after your last dose.  If you wish to have your labs drawn at another location, please call the office 24 hours in advance so we can fax the orders.  The office is located at [address], [place], [place] [address]   If you have any questions regarding directions or hours of operation,  please call [phone].   As a reminder, please drink plenty of water prior to coming for your lab work. Thanks!   Vaccines You are taking a medication(s) that can suppress your immune  system.  The following immunizations are recommended: Flu annually Covid-19  RSV Td/Tdap (tetanus, diphtheria, pertussis) every 10 years Pneumonia (Prevnar 15 then Pneumovax 23 at least 1 year apart.  Alternatively, can take Prevnar 20 without needing additional dose) Shingrix: 2 doses from 4 weeks to 6 months apart  Please check with your PCP to make sure you are up to date.   If you have signs or symptoms of an infection or start antibiotics: First, call your PCP for workup of your infection. Hold your medication through the infection, until you complete your antibiotics, and until symptoms resolve if you take the following: Injectable medication (Actemra, Benlysta, Cimzia, Cosentyx, Enbrel, Humira , Kevzara, Orencia, Remicade, Simponi, Stelara, Taltz, Tremfya) Methotrexate  Leflunomide (Arava) Mycophenolate (Cellcept) Xeljanz, Olumiant, or Rinvoq  Please get an annual skin examination to screen for skin cancer.  Please use sunscreen and sun protection.

## 2024-01-09 NOTE — Progress Notes (Signed)
 Office Visit Note  Patient: Margaret Hobbs             Date of Birth: 1951-04-11           MRN: [id number]             PCP: Jeannine Milroy., MD Referring: Jeannine Milroy., MD Visit Date: 01/09/2024 Occupation: @GUAROCC @  Subjective:  Medication management  History of Present Illness: Americus Herson is a 73 y.o. female with psoriatic arthritis, psoriasis and osteoarthritis.  She states with the weather change she has been experiencing some discomfort in her hands and her knee joints.  She has not noticed any joint swelling.  She has been on Humira  40 mg subcu every other week without any interruption.  She denies any psoriasis patches currently.  She states she has some dry skin on her years and her feet.  She uses topical steroid cream as needed.  She denies any history of uveitis, plantar fasciitis or Achilles tendinitis.    Activities of Daily Living:  Patient reports morning stiffness for 0 minutes.   Patient Denies nocturnal pain.  Difficulty dressing/grooming: Reports Difficulty climbing stairs: Reports Difficulty getting out of chair: Reports Difficulty using hands for taps, buttons, cutlery, and/or writing: Reports  Review of Systems  Constitutional: Negative.  Negative for fatigue.  HENT: Negative.  Negative for mouth sores and mouth dryness.   Eyes: Negative.  Negative for dryness.  Respiratory: Negative.  Negative for shortness of breath.   Cardiovascular: Negative.  Negative for chest pain and palpitations.  Gastrointestinal: Negative.  Negative for blood in stool, constipation and diarrhea.  Endocrine: Negative.  Negative for increased urination.  Genitourinary: Negative.  Negative for involuntary urination.  Musculoskeletal:  Positive for joint pain, gait problem, joint pain and muscle weakness. Negative for joint swelling, myalgias, morning stiffness, muscle tenderness and myalgias.  Skin:  Positive for rash. Negative for color change, hair loss  and sensitivity to sunlight.  Allergic/Immunologic: Negative.  Negative for susceptible to infections.  Neurological:  Negative for dizziness and headaches.  Hematological: Negative.  Negative for swollen glands.  Psychiatric/Behavioral: Negative.  Negative for depressed mood and sleep disturbance. The patient is not nervous/anxious.     PMFS History:  Patient Active Problem List   Diagnosis Date Noted   PVC's (premature ventricular contractions) 06/15/2023   Paroxysmal atrial fibrillation (HCC) 06/14/2023   Posterior vitreous detachment of both eyes 05/04/2022   Other specified counseling 03/10/2022   Epistaxis 11/29/2021   Hypercoagulable state (HCC) 11/29/2021   Morbid obesity (HCC) 11/29/2021   Pseudophakia of both eyes 04/28/2021   Claudication in peripheral vascular disease (HCC) 03/31/2021   Chronic kidney disease due to hypertension 05/18/2020   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) 05/06/2020   Round hole of right eye 05/06/2020   Severe nonproliferative diabetic retinopathy of left eye (HCC) 05/06/2020   Retinal hemorrhage of left eye 05/06/2020   Peripheral arterial disease (HCC) 10/17/2019   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   Carotid artery disease (HCC) 01/24/2017   Primary osteoarthritis of both knees 11/29/2016   Other psoriasis 11/28/2016   Psoriatic arthritis (HCC) 11/28/2016   High risk medication use 11/28/2016   Chronic kidney disease, stage 3a (HCC) 11/25/2012   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/29/2009   Carotid artery occlusion 10/19/2009   Age-related osteoporosis without current pathological fracture 10/19/2009   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   Type  2 diabetes mellitus (HCC) 03/30/2009   Hyperlipidemia 03/30/2009   Dyslipidemia 03/30/2009   Essential hypertension 03/30/2009   History of MI (myocardial infarction) 03/30/2009    Coronary artery disease involving native coronary artery of native heart with angina pectoris (HCC) 03/30/2009   Cerebral artery occlusion with cerebral infarction (HCC) 03/30/2009   URINARY TRACT INFECTION 03/30/2009   Arthropathy 03/30/2009   HYPERPARATHYROIDISM, HX OF 03/30/2009    Past Medical History:  Diagnosis Date   Aphasia    Arthritis    CAD (coronary artery disease)    Cerebrovascular disease    CVA (cerebral vascular accident) (HCC)    Depressed    Diabetes mellitus    DM2 (diabetes mellitus, type 2) (HCC)    HLD (hyperlipidemia)    HTN (hypertension)    Hypercholesteremia    Hyperparathyroidism    MI (myocardial infarction) (HCC)    Osteoporosis    TIA (transient ischemic attack)    UTI (urinary tract infection)     Family History  Problem Relation Age of Onset   Heart disease Mother        pacemaker   Hypertension Mother    Hyperlipidemia Mother    Diabetes Brother        pre-diabetic    Hypertension Son    Sleep apnea Son    Diabetes Other    Hypertension Other    Alcohol abuse Other    Coronary artery disease Other    Past Surgical History:  Procedure Laterality Date   ABDOMINAL AORTOGRAM W/LOWER EXTREMITY Bilateral 03/31/2021   Procedure: ABDOMINAL AORTOGRAM W/LOWER EXTREMITY;  Surgeon: Avanell Leigh, MD;  Location: [facility] INVASIVE CV LAB;  Service: Cardiovascular;  Laterality: Bilateral;   ABDOMINAL HYSTERECTOMY     CATARACT EXTRACTION W/PHACO Bilateral 2017   Dr. Reyne Cave   hysterectomy - unknown type     Social History   Social History Narrative   Not on file   Immunization History  Administered Date(s) Administered   PFIZER(Purple Top)SARS-COV-2 Vaccination 02/16/2020, 03/08/2020, 11/05/2020     Objective: Vital Signs: BP (!) 144/52 (BP Location: Left Wrist, Patient Position: Sitting, Cuff Size: Large) Comment (BP Location): diff machine used  Pulse 78   Resp 16   Ht 5\' 2"  (1.575 m)   Wt 199 lb 9.6 oz (90.5 kg)   BMI 36.51 kg/m     Physical Exam Vitals and nursing note reviewed.  Constitutional:      Appearance: She is well-developed.  HENT:     Head: Normocephalic and atraumatic.  Eyes:     Conjunctiva/sclera: Conjunctivae normal.  Cardiovascular:     Rate and Rhythm: Normal rate and regular rhythm.     Heart sounds: Normal heart sounds.  Pulmonary:     Effort: Pulmonary effort is normal.     Breath sounds: Normal breath sounds.  Abdominal:     General: Bowel sounds are normal.     Palpations: Abdomen is soft.  Musculoskeletal:     Cervical back: Normal range of motion.  Lymphadenopathy:     Cervical: No cervical adenopathy.  Skin:    General: Skin is warm and dry.     Capillary Refill: Capillary refill takes less than 2 seconds.  Neurological:     Mental Status: She is alert and oriented to person, place, and time.     Comments: Left-sided hemiparesis   Psychiatric:        Behavior: Behavior normal.      Musculoskeletal Exam: She had good range of motion  of the cervical spine.  Thoracic kyphosis without any tenderness was noted.  Lumbar spine was difficult to assess.  She has left-sided hemiparesis.  She had good range of motion of her right shoulder joint.  Her shoulder joint abduction was limited to about 140 degrees with limited internal rotation.  Elbows and wrist joints with good range of motion.  There was no synovitis over MCPs PIPs and DIPs.  Hip joints were difficult to assess in the seated position.  Knee joints in good range of motion without any warmth swelling or effusion.  There was no tenderness or synovitis over ankles or MTPs.  There was no Achilles tendinitis or plantar fasciitis.  CDAI Exam: CDAI Score: -- Patient Global: --; Provider Global: -- Swollen: --; Tender: -- Joint Exam 01/09/2024   No joint exam has been documented for this visit   There is currently no information documented on the homunculus. Go to the Rheumatology activity and complete the homunculus joint  exam.  Investigation: No additional findings.  Imaging: No results found.  Recent Labs: Lab Results  Component Value Date   WBC 5.9 07/19/2023   HGB 12.6 07/19/2023   PLT 229 07/19/2023   NA 141 07/19/2023   K 3.7 07/19/2023   CL 101 07/19/2023   CO2 30 07/19/2023   GLUCOSE 255 (H) 07/19/2023   BUN 37 (H) 07/19/2023   CREATININE 0.98 07/19/2023   BILITOT 0.4 07/19/2023   ALKPHOS 37 (L) 09/17/2021   AST 14 07/19/2023   ALT 16 07/19/2023   PROT 6.8 07/19/2023   ALBUMIN 3.4 (L) 09/17/2021   CALCIUM  10.0 07/19/2023   GFRAA 69 07/01/2019   QFTBGOLDPLUS NEGATIVE 07/19/2023     Speciality Comments: No specialty comments available.  Procedures:  No procedures performed Allergies: Remicade [infliximab], Amiodarone , Dapagliflozin, Semaglutide, and Sulfa antibiotics   Assessment / Plan:     Visit Diagnoses: Psoriatic arthritis (HCC)-patient has been experiencing increased joint pain.  No synovitis was noted on the examination.  She states the pain has been related to colder temperatures outside.  There is no history of uveitis, venous tinnitus or plantar fasciitis.  She has been taking Humira  40 mg subcu every other week without any interruption.  Other psoriasis-she had dry skin behind her pinna and on her feet.  She is uses topical steroids as needed.  Use of moisturizers were also discussed.  High risk medication use - Humira  40 mg sq injections every 14 days.October 09, 2023 labs from PCPs office: CBC WBC 6.7, hemoglobin 12.7, platelets 253, creatinine 0.99, AST 19, ALT 15, vitamin D55.7, hemoglobin A1c 8.1.  Patient wants to get repeat labs with her PCP.  Advised her to get CBC with differential, CMP with GFR and lipid panel.  She was advised to get CBC with differential and CMP with GFR every 3 months.  TB Gold is due in July 2025.  Information about immunization was placed in the AVS.  She was advised to hold Humira  if she develops an infection resume after the infection  resolves.  Annual skin examination to screen for skin cancer was advised.  Use of sunscreen and sun protection was discussed.  Primary osteoarthritis of both knees-she is experiencing increased pain in her knee joints with the weather change.  No warmth swelling or effusion was noted.  Age-related osteoporosis without current pathological fracture - Followed by Dr. Bernetta Brilliant.  History of atrial fibrillation - History of PAD with symptomatic left calf claudication s/p angiography.  She is followed by Dr.  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  History of diabetes mellitus  Dyslipidemia - October 2023 LDL 50, triglycerides 154  History of coronary artery disease  Orders: No orders of the defined types were placed in this encounter.  No orders of the defined types were placed in this encounter.   Follow-Up Instructions: Return in about 5 months (around 06/08/2024) for Psoriatic arthritis.   Nicholas Bari, MD  Note - This record has been created using Animal nutritionist.  Chart creation errors have been sought, but may not always  have been located. Such creation errors do not reflect on  the standard of medical care.

## 2024-01-10 ENCOUNTER — Ambulatory Visit: Payer: Medicare Other | Admitting: Rheumatology

## 2024-01-10 DIAGNOSIS — M170 Bilateral primary osteoarthritis of knee: Secondary | ICD-10-CM

## 2024-01-10 DIAGNOSIS — Z79899 Other long term (current) drug therapy: Secondary | ICD-10-CM

## 2024-01-10 DIAGNOSIS — L408 Other psoriasis: Secondary | ICD-10-CM

## 2024-01-10 DIAGNOSIS — Z8679 Personal history of other diseases of the circulatory system: Secondary | ICD-10-CM

## 2024-01-10 DIAGNOSIS — L4050 Arthropathic psoriasis, unspecified: Secondary | ICD-10-CM

## 2024-01-10 DIAGNOSIS — I6350 Cerebral infarction due to unspecified occlusion or stenosis of unspecified cerebral artery: Secondary | ICD-10-CM

## 2024-01-10 DIAGNOSIS — E785 Hyperlipidemia, unspecified: Secondary | ICD-10-CM

## 2024-01-10 DIAGNOSIS — Z8639 Personal history of other endocrine, nutritional and metabolic disease: Secondary | ICD-10-CM

## 2024-01-10 DIAGNOSIS — M810 Age-related osteoporosis without current pathological fracture: Secondary | ICD-10-CM

## 2024-01-29 LAB — LAB REPORT - SCANNED: EGFR: 54.7

## 2024-02-01 ENCOUNTER — Telehealth: Payer: Self-pay | Admitting: *Deleted

## 2024-02-01 NOTE — Telephone Encounter (Signed)
 Labs received from:[facility]   Drawn on:01/29/2024  Reviewed by: Jacinta Martinis, PA-C  Labs drawn: CMP, CBC, Vitamin D  Results: Glucose 196   BUN 30   Alk. Phos 31    MPV 11.2  Patient is on Humira  40 mg SQ every 14 days.

## 2024-02-20 NOTE — Progress Notes (Signed)
 [facility] CARDI[facility] CONSULT NOTE       Patient ID: Margaret Hobbs MRN: [id number] DOB/AGE: 1951/09/16 73 y.o.  Primary Physician: Cleatis Polka., MD Primary Cardiologist: [facility] MD  Allyson Sabal      HPI:  73 y.o. with history of CAD inferior MI 2008 Rx BMS to RCA, HTN, HLD TIA, psoriatic arthritis PAF And PVD. Angiogram 04/01/21 aborted due to PAF calcified CTO distal left SFA with one vessel run off and 70% right SFA.  Converted to NSR with amiodarone Started on eliquis and plavix only for CAD. Amiodarone  D/c  04/15/21 due to dizziness. She is intolerant to lipitor but takes crestor   TTE 04/14/21 EF 60-65% trivial MR AV sclerosis LA 3.6 cm  myovue 07/28/21 normal no ischemia EF 62%  She felt horrible right away with amiodarone Still with LE claudication using cane Deferred f/u with Dr Allyson Sabal Most recent carotid 09/29/21 with plaque no stenosis   Seen by EP Dr Lalla Brothers for PAF 08/10/21 Monitor that month with <1% PAF burden Discussed Tikosyn but did not think AAT needed at that time However 01/2022 had PAF in office Repeat monitor showed frequent long episodes of PAF Seen by EP again 03/16/22 18% PAF burden Tikosyn recommended and patient wanted to think about it She is unaware of her PAF   Seen in ED 06/08/23 palpitations was in NSR ? Triggered by starting Rybelsus with nausea some SSCP Myovue 06/25/23 normal perfusion EF 60%   No resting pain has moderate claudication LLE ABI"s  11/21/23 normal on right 0.58 moderately reduced on left She has seen Dr Randie Heinz in past Known CTO left above knee popliteal/SFA  No significant claudication. Rhythm stable No chest pain     ROS All other systems reviewed and negative except as noted above  Past Medical History:  Diagnosis Date   Aphasia    Arthritis    CAD (coronary artery disease)    Cerebrovascular disease    CVA (cerebral vascular accident) (HCC)    Depressed    Diabetes mellitus    DM2 (diabetes mellitus, type 2) (HCC)    HLD  (hyperlipidemia)    HTN (hypertension)    Hypercholesteremia    Hyperparathyroidism    MI (myocardial infarction) (HCC)    Osteoporosis    TIA (transient ischemic attack)    UTI (urinary tract infection)     Family History  Problem Relation Age of Onset   Heart disease Mother        pacemaker   Hypertension Mother    Hyperlipidemia Mother    Diabetes Brother        pre-diabetic    Hypertension Son    Sleep apnea Son    Diabetes Other    Hypertension Other    Alcohol abuse Other    Coronary artery disease Other     Social History   Socioeconomic History   Marital status: Widowed    Spouse name: Not on file   Number of children: 1   Years of education: Not on file   Highest education level: Not on file  Occupational History   Not on file  Tobacco Use   Smoking status: Never    Passive exposure: Past   Smokeless tobacco: Never  Vaping Use   Vaping status: Never Used  Substance and Sexual Activity   Alcohol use: No   Drug use: Never   Sexual activity: Not on file  Other Topics Concern   Not on file  Social History  Narrative   Not on file   Social Drivers of Health   Financial Resource Strain: Not on file  Food Insecurity: Not on file  Transportation Needs: Not on file  Physical Activity: Not on file  Stress: Not on file  Social Connections: Unknown (05/08/2022)   Received from [facility], [facility]   Social Network    Social Network: Not on file  Intimate Partner Violence: Unknown (03/30/2022)   [facility]   HITS    Physically Hurt: Not on file    Insult or Talk Down To: Not on file    Threaten Physical Harm: Not on file    Scream or Curse: Not on file    Past Surgical History:  Procedure Laterality Date   ABDOMINAL AORTOGRAM W/LOWER EXTREMITY Bilateral 03/31/2021   Procedure: ABDOMINAL AORTOGRAM W/LOWER EXTREMITY;  Surgeon: Runell Gess, MD;  Location: [facility] INVASIVE CV LAB;  Service: Cardiovascular;  Laterality:  Bilateral;   ABDOMINAL HYSTERECTOMY     CATARACT EXTRACTION W/PHACO Bilateral 2017   Dr. Alben Spittle   hysterectomy - unknown type        Current Outpatient Medications:    Ascorbic Acid (VITAMIN C) 500 MG tablet, Take 500 mg by mouth 3 (three) times a week., Disp: , Rfl:    cholecalciferol (VITAMIN D) 1000 UNITS tablet, Take 1,000 Units by mouth 2 (two) times daily., Disp: , Rfl:    clopidogrel (PLAVIX) 75 MG tablet, TAKE 1 TABLET BY MOUTH DAILY, Disp: 90 tablet, Rfl: 2   clotrimazole-betamethasone (LOTRISONE) cream, Apply 1 Application topically daily., Disp: 30 g, Rfl: 0   Coenzyme Q10 100 MG TABS, Take 100 mg by mouth in the morning., Disp: , Rfl:    ELIQUIS 5 MG TABS tablet, TAKE 1 TABLET BY MOUTH TWICE A DAY, Disp: 180 tablet, Rfl: 1   ezetimibe (ZETIA) 10 MG tablet, Take 1 tablet by mouth daily., Disp: , Rfl:    folic acid (FOLVITE) 1 MG tablet, Take 1 tablet by mouth daily., Disp: , Rfl:    glipiZIDE-metformin (METAGLIP) 5-500 MG tablet, Take 1 tablet by mouth 2 (two) times daily., Disp: , Rfl:    glucose blood (ONETOUCH VERIO) test strip, 1 strip In Vitro daily for 30 days, Disp: , Rfl:    HUMIRA, 2 SYRINGE, 40 MG/0.4ML prefilled syringe, INJECT 1 SYRINGE SUBCUTANEOUSLY  EVERY OTHER WEEK, Disp: 2 each, Rfl: 2   hydrALAZINE (APRESOLINE) 50 MG tablet, Take 1 tablet (50 mg total) by mouth at bedtime., Disp: 90 tablet, Rfl: 2   JANUVIA 100 MG tablet, Take 100 mg by mouth daily., Disp: , Rfl:    Lancets (ONETOUCH DELICA PLUS LANCET33G) MISC, Apply topically daily., Disp: , Rfl:    metoprolol succinate (TOPROL XL) 50 MG 24 hr tablet, Take 1.5 tablets (75 mg total) by mouth daily. Take with or immediately following a meal., Disp: 135 tablet, Rfl: 3   olmesartan-hydrochlorothiazide (BENICAR HCT) 40-25 MG tablet, Take 1 tablet by mouth daily., Disp: , Rfl:    potassium chloride SA (KLOR-CON M) 20 MEQ tablet, Take 20 mEq by mouth 2 (two) times daily., Disp: , Rfl:    simvastatin (ZOCOR) 40 MG  tablet, Take 1 tablet (40 mg total) by mouth at bedtime., Disp: 90 tablet, Rfl: 3   VITAMIN A PO, Take 3,000 Units by mouth daily., Disp: , Rfl:    vitamin E 400 UNIT capsule, Take 400 Units by mouth in the morning., Disp: , Rfl:    amoxicillin (AMOXIL)  500 MG capsule, Take 2,000 mg by mouth as directed. Take 4 capsules (2000 mg) by mouth 1 hour prior to dental procedures (Patient not taking: Reported on 03/03/2024), Disp: , Rfl: 0   magnesium oxide (MAG-OX) 400 MG tablet, Take 400 mg by mouth every other day., Disp: , Rfl:    nitroGLYCERIN (NITROSTAT) 0.4 MG SL tablet, PLACE 1 TABLET UNDER THE TONGUE EVERY 5 (FIVE) MINUTES AS NEEDED FOR CHEST PAIN (3 DOSES MAX) (Patient not taking: Reported on 03/03/2024), Disp: 25 tablet, Rfl: 2  Current Facility-Administered Medications:    sodium chloride flush (NS) 0.9 % injection 3 mL, 3 mL, Intravenous, Q12H, Runell Gess, MD  Facility-Administered Medications Ordered in Other Visits:    regadenoson (LEXISCAN) injection SOLN 0.4 mg, 0.4 mg, Intravenous, Once, Pricilla Riffle, MD   technetium tetrofosmin (TC-MYOVIEW) injection 33 millicurie, 33 millicurie, Intravenous, Once PRN, Pricilla Riffle, MD  sodium chloride flush  3 mL Intravenous Q12H     Physical Exam: There were no vitals taken for this visit.    Affect appropriate Overweight female  HEENT: normal Neck supple with no adenopathy JVP normal no bruits no thyromegaly Lungs clear with no wheezing and good diaphragmatic motion Heart:  S1/S2 no murmur, no rub, gallop or click PMI normal Abdomen: benighn, BS positve, no tenderness, no AAA no bruit.  No HSM or HJR Decreased pulses bilaterally  No edema Neuro non-focal Skin warm and dry No muscular weakness   Labs:   Lab Results  Component Value Date   WBC 5.9 07/19/2023   HGB 12.6 07/19/2023   HCT 39.0 07/19/2023   MCV 93.5 07/19/2023   PLT 229 07/19/2023   No results for input(s): "NA", "K", "CL", "CO2", "BUN", "CREATININE",  "CALCIUM", "PROT", "BILITOT", "ALKPHOS", "ALT", "AST", "GLUCOSE" in the last 168 hours.  Invalid input(s): "LABALBU" Lab Results  Component Value Date   CKTOTAL 28 06/03/2007   CKMB 0.7 06/03/2007   TROPONINI 0.02        NO INDICATION OF MYOCARDIAL INJURY. 06/03/2007    Lab Results  Component Value Date   CHOL 138 08/24/2016   CHOL  06/03/2007    116        ATP III CLASSIFICATION:  <200     mg/dL   Desirable  086-578  mg/dL   Borderline High  >=469    mg/dL   High   Lab Results  Component Value Date   HDL 40 (L) 08/24/2016   HDL 40 06/03/2007   Lab Results  Component Value Date   LDLCALC 59 08/24/2016   LDLCALC  06/03/2007    57        Total Cholesterol/HDL:CHD Risk Coronary Heart Disease Risk Table                     Men   Women  1/2 Average Risk   3.4   3.3   Lab Results  Component Value Date   TRIG 197 (H) 08/24/2016   TRIG 94 06/03/2007   Lab Results  Component Value Date   CHOLHDL 3.5 08/24/2016   CHOLHDL 2.9 06/03/2007   No results found for: "LDLDIRECT"    Radiology: No results found.  EKG: 03/15/22 SR PVC normal QT    ASSESSMENT AND PLAN:   CAD:  2008 BMS to RCA f/u Non ischemic myovue August 2022 and 06/25/23 continue medical Rx  PAF:  03/2021 in setting of PV angiogram NSR amiodarone d/c due to dizziness continue Eliquis Seen by  EP March for high PAF burden Offered Tikosyn but deferred   PVD:  Bilateral SFA dx with CTO left SFA Single vessel run off bilaterally Seen by Dr Randie Heinz 03/08/22 and recommended walking program and medical management ABI on left 0.58 on left  F/U Dr Randie Heinz  DM:  Discussed low carb diet.  Target hemoglobin A1c is 6.5 or less.  Continue current medications. A1c 7.2    F/U cardiology in a year  F/U [facility] Dr Randie Heinz     Signed: Charlton Haws 03/03/2024, 10:22 AM

## 2024-02-26 ENCOUNTER — Other Ambulatory Visit: Payer: Self-pay | Admitting: Physician Assistant

## 2024-02-26 NOTE — Telephone Encounter (Signed)
 Last Fill: 11/19/2023  Labs: 02/01/2024 Glucose 196    BUN 30    Alk. Phos 31     MPV 11.2  TB Gold: 07/19/2023 Neg    Next Visit: 06/26/2024  Last Visit: 01/09/2024  UJ:[id number] arthritis   Current Dose per office note 01/09/2024: Humira 40 mg sq injections every 14 days.   Okay to refill Humira?

## 2024-03-03 ENCOUNTER — Ambulatory Visit: Payer: Medicare Other | Attending: Cardiovascular Disease | Admitting: Cardiovascular Disease

## 2024-03-03 ENCOUNTER — Encounter: Payer: Self-pay | Admitting: Cardiovascular Disease

## 2024-03-03 DIAGNOSIS — I480 Paroxysmal atrial fibrillation: Secondary | ICD-10-CM

## 2024-03-03 DIAGNOSIS — I25119 Atherosclerotic heart disease of native coronary artery with unspecified angina pectoris: Secondary | ICD-10-CM | POA: Diagnosis not present

## 2024-03-03 DIAGNOSIS — I70212 Atherosclerosis of native arteries of extremities with intermittent claudication, left leg: Secondary | ICD-10-CM | POA: Diagnosis not present

## 2024-03-03 DIAGNOSIS — I10 Essential (primary) hypertension: Secondary | ICD-10-CM | POA: Diagnosis not present

## 2024-03-03 DIAGNOSIS — E7800 Pure hypercholesterolemia, unspecified: Secondary | ICD-10-CM

## 2024-03-03 NOTE — Patient Instructions (Signed)

## 2024-03-10 ENCOUNTER — Other Ambulatory Visit: Payer: Self-pay | Admitting: Cardiovascular Disease

## 2024-03-17 ENCOUNTER — Ambulatory Visit: Payer: Medicare Other | Admitting: Podiatry

## 2024-03-17 ENCOUNTER — Encounter: Payer: Self-pay | Admitting: Podiatry

## 2024-03-17 VITALS — Ht 62.0 in | Wt 199.0 lb

## 2024-03-17 DIAGNOSIS — M79674 Pain in right toe(s): Secondary | ICD-10-CM | POA: Diagnosis not present

## 2024-03-17 DIAGNOSIS — E1151 Type 2 diabetes mellitus with diabetic peripheral angiopathy without gangrene: Secondary | ICD-10-CM | POA: Diagnosis not present

## 2024-03-17 DIAGNOSIS — B351 Tinea unguium: Secondary | ICD-10-CM

## 2024-03-17 DIAGNOSIS — M79675 Pain in left toe(s): Secondary | ICD-10-CM | POA: Diagnosis not present

## 2024-03-17 NOTE — Progress Notes (Signed)
 This patient returns to my office for at risk foot care.  This patient requires this care by a professional since this patient will be at risk due to having diabetes.  This patient is unable to cut nails himself since the patient cannot reach his nails.These nails are painful walking and wearing shoes.  This patient presents for at risk foot care today.  General Appearance  Alert, conversant and in no acute stress.  Vascular  Dorsalis pedis and posterior tibial  pulses are palpable  bilaterally.  Capillary return is within normal limits  bilaterally. Temperature is within normal limits  bilaterally.  Neurologic  Senn-Weinstein monofilament wire test within normal limits  bilaterally. Muscle power within normal limits bilaterally.  Nails Thick disfigured discolored nails with subungual debris  from hallux to fifth toes bilaterally. No evidence of bacterial infection or drainage bilaterally.  Orthopedic  No limitations of motion  feet .  No crepitus or effusions noted.  No bony pathology or digital deformities noted.  Skin  normotropic skin with no porokeratosis noted bilaterally.  No signs of infections or ulcers noted.     Onychomycosis  Pain in right toes  Pain in left toes  Consent was obtained for treatment procedures.   Mechanical debridement of nails 1-5  bilaterally performed with a nail nipper.  Filed with dremel without incident.    Return office visit                     Told patient to return for periodic foot care and evaluation due to potential at risk complications.   Helane Gunther DPM

## 2024-04-11 ENCOUNTER — Other Ambulatory Visit: Payer: Self-pay | Admitting: Cardiovascular Disease

## 2024-04-17 ENCOUNTER — Other Ambulatory Visit: Payer: Self-pay | Admitting: Physician Assistant

## 2024-04-26 ENCOUNTER — Other Ambulatory Visit: Payer: Self-pay | Admitting: Physician Assistant

## 2024-06-03 LAB — LAB REPORT - SCANNED: EGFR: 54.7

## 2024-06-09 ENCOUNTER — Telehealth: Payer: Self-pay | Admitting: *Deleted

## 2024-06-09 NOTE — Telephone Encounter (Signed)
 Labs received from: [facility]  Drawn on: 06/06/2024  Reviewed by: Dr. Nicholas Bari   Labs drawn: Vitamin D, CBC, CMP  Results: MPV 10.8   MONO % 13.7   Glucose 175  Patient is on Humira  40 mg SQ every 14 days.

## 2024-06-12 NOTE — Progress Notes (Signed)
 Office Visit Note  Patient: Margaret Hobbs             Date of Birth: 1951/04/29           MRN: [id number]             PCP: Loreli Elsie JONETTA Mickey., MD Referring: Loreli Elsie JONETTA Mickey., MD Visit Date: 06/26/2024 Occupation: @GUAROCC @  Subjective:  Medication management  History of Present Illness: Margaret Hobbs is a 73 y.o. female with psoriatic arthritis, psoriasis and osteoarthritis.  She returns today after her last visit in January 2025.  She states she has been taking Humira  injections every other week without any interruption.  She has not noticed any joint swelling but her feet has been puffy especially her right foot.  She has also noticed a rash on the toes of her right foot.  The psoriasis patch behind her ear persist.  She states that she has been using topical steroids on as needed basis.  There is no history of Achilles tendinitis, plantar fasciitis or uveitis.  She denies history of palpitations or shortness of breath.    Activities of Daily Living:  Patient reports morning stiffness for 1-2 minutes.   Patient Denies nocturnal pain.  Difficulty dressing/grooming: Denies Difficulty climbing stairs: Reports Difficulty getting out of chair: Reports Difficulty using hands for taps, buttons, cutlery, and/or writing: Reports  Review of Systems  Constitutional:  Positive for fatigue.  HENT:  Negative for mouth sores and mouth dryness.   Eyes:  Negative for dryness.  Respiratory:  Negative for shortness of breath.   Cardiovascular:  Positive for swelling in legs/feet. Negative for chest pain and palpitations.  Gastrointestinal:  Negative for blood in stool, constipation and diarrhea.  Endocrine: Negative for increased urination.  Genitourinary:  Negative for involuntary urination.  Musculoskeletal:  Positive for joint pain, gait problem (every once in awhile), joint pain, joint swelling and morning stiffness. Negative for myalgias, muscle weakness, muscle tenderness  and myalgias.  Skin:  Positive for rash. Negative for color change and sensitivity to sunlight.  Allergic/Immunologic: Positive for susceptible to infections.  Neurological:  Negative for dizziness and headaches.  Hematological:  Negative for swollen glands.  Psychiatric/Behavioral:  Negative for depressed mood and sleep disturbance. The patient is nervous/anxious.     PMFS History:  Patient Active Problem List   Diagnosis Date Noted   PVC's (premature ventricular contractions) 06/15/2023   Paroxysmal atrial fibrillation (HCC) 06/14/2023   Posterior vitreous detachment of both eyes 05/04/2022   Other specified counseling 03/10/2022   Epistaxis 11/29/2021   Hypercoagulable state (HCC) 11/29/2021   Morbid obesity (HCC) 11/29/2021   Pseudophakia of both eyes 04/28/2021   Claudication in peripheral vascular disease (HCC) 03/31/2021   Chronic kidney disease due to hypertension 05/18/2020   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) 05/06/2020   Round hole of right eye 05/06/2020   Severe nonproliferative diabetic retinopathy of left eye (HCC) 05/06/2020   Retinal hemorrhage of left eye 05/06/2020   Peripheral arterial disease (HCC) 10/17/2019   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   Carotid artery disease (HCC) 01/24/2017   Primary osteoarthritis of both knees 11/29/2016   Other psoriasis 11/28/2016   Psoriatic arthritis (HCC) 11/28/2016   High risk medication use 11/28/2016   Chronic kidney disease, stage 3a (HCC) 11/25/2012   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/29/2009   Carotid artery occlusion 10/19/2009   Age-related osteoporosis without current pathological  fracture 10/19/2009   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   Type 2 diabetes mellitus (HCC) 03/30/2009   Hyperlipidemia 03/30/2009   Dyslipidemia 03/30/2009   Essential hypertension 03/30/2009   History of MI  (myocardial infarction) 03/30/2009   Coronary artery disease involving native coronary artery of native heart with angina pectoris (HCC) 03/30/2009   Cerebral artery occlusion with cerebral infarction (HCC) 03/30/2009   URINARY TRACT INFECTION 03/30/2009   Arthropathy 03/30/2009   HYPERPARATHYROIDISM, HX OF 03/30/2009    Past Medical History:  Diagnosis Date   Aphasia    Arthritis    CAD (coronary artery disease)    Cerebrovascular disease    CVA (cerebral vascular accident) (HCC)    Depressed    Diabetes mellitus    DM2 (diabetes mellitus, type 2) (HCC)    HLD (hyperlipidemia)    HTN (hypertension)    Hypercholesteremia    Hyperparathyroidism    MI (myocardial infarction) (HCC)    Osteoporosis    Stye 06/22/2024   right eye   TIA (transient ischemic attack)    UTI (urinary tract infection)     Family History  Problem Relation Age of Onset   Heart disease Mother        pacemaker   Hypertension Mother    Hyperlipidemia Mother    Diabetes Brother        pre-diabetic    Hypertension Son    Sleep apnea Son    Diabetes Other    Hypertension Other    Alcohol abuse Other    Coronary artery disease Other    Past Surgical History:  Procedure Laterality Date   ABDOMINAL AORTOGRAM W/LOWER EXTREMITY Bilateral 03/31/2021   Procedure: ABDOMINAL AORTOGRAM W/LOWER EXTREMITY;  Surgeon: Court Dorn PARAS, MD;  Location: [facility];  Service: Cardiovascular;  Laterality: Bilateral;   ABDOMINAL HYSTERECTOMY     CATARACT EXTRACTION W/PHACO Bilateral 2017   Dr. Lelon   hysterectomy - unknown type     Social History   Social History Narrative   Not on file   Immunization History  Administered Date(s) Administered   PFIZER(Purple Top)SARS-COV-2 Vaccination 02/16/2020, 03/08/2020, 11/05/2020, 10/31/2021   Zoster Recombinant(Shingrix) 08/10/2020     Objective: Vital Signs: BP 131/75 (BP Location: Left Arm, Patient Position: Sitting, Cuff Size: Normal)   Pulse (!) 112    Resp 16   Ht 5' 2 (1.575 m)   Wt 202 lb (91.6 kg)   BMI 36.95 kg/m    Physical Exam Vitals and nursing note reviewed.  Constitutional:      Appearance: She is well-developed.  HENT:     Head: Normocephalic and atraumatic.  Eyes:     Conjunctiva/sclera: Conjunctivae normal.  Cardiovascular:     Rate and Rhythm: Normal rate and regular rhythm.     Heart sounds: Normal heart sounds.  Pulmonary:     Effort: Pulmonary effort is normal.     Breath sounds: Normal breath sounds.  Abdominal:     General: Bowel sounds are normal.     Palpations: Abdomen is soft.  Musculoskeletal:     Cervical back: Normal range of motion.     Right lower leg: Edema present.     Left lower leg: Edema present.  Lymphadenopathy:     Cervical: No cervical adenopathy.  Skin:    General: Skin is warm and dry.     Capillary Refill: Capillary refill takes less than 2 seconds.  Neurological:     Mental Status: She is alert and oriented to person,  place, and time.  Psychiatric:        Behavior: Behavior normal.      Musculoskeletal Exam: She had good range of motion of the cervical spine, thoracic and lumbar spine without any tenderness over SI joints.  Thoracic kyphosis was noted.  She had left-sided hemiparesis.  Shoulders were in good range of motion except for the left shoulder joint and limited internal rotation.  Elbows and wrist joints in good range of motion.  With there was no synovitis over MCPs PIPs or DIPs.  Hip joints and knee joints in good range of motion.  She had edema on her bilateral lower extremities more prominent on the right lower extremity.  Few petechiae were noted on the left foot 3rd and 4th toe most likely due to pedal edema.  CDAI Exam: CDAI Score: -- Patient Global: --; Provider Global: -- Swollen: --; Tender: -- Joint Exam 06/26/2024   No joint exam has been documented for this visit   There is currently no information documented on the homunculus. Go to the Rheumatology  activity and complete the homunculus joint exam.  Investigation: No additional findings.  Imaging: No results found.  Recent Labs: Lab Results  Component Value Date   WBC 5.9 07/19/2023   HGB 12.6 07/19/2023   PLT 229 07/19/2023   NA 141 07/19/2023   K 3.7 07/19/2023   CL 101 07/19/2023   CO2 30 07/19/2023   GLUCOSE 255 (H) 07/19/2023   BUN 37 (H) 07/19/2023   CREATININE 0.98 07/19/2023   BILITOT 0.4 07/19/2023   ALKPHOS 37 (L) 09/17/2021   AST 14 07/19/2023   ALT 16 07/19/2023   PROT 6.8 07/19/2023   ALBUMIN 3.4 (L) 09/17/2021   CALCIUM  10.0 07/19/2023   GFRAA 69 07/01/2019   QFTBGOLDPLUS NEGATIVE 07/19/2023      Speciality Comments: No specialty comments available.  Procedures:  No procedures performed Allergies: Remicade [infliximab], Amiodarone , Dapagliflozin, Semaglutide, and Sulfa antibiotics   Assessment / Plan:     Visit Diagnoses: Psoriatic arthritis (HCC) -patient denies having a flare of psoriatic arthritis.  She continues to have some joint stiffness.  No synovitis was noted on the examination.  She has been tolerating Humira  without any side effects.  There is no evidence of plantar fasciitis, Achilles tendinitis, sacroiliitis or uveitis.  Prescription refill for Humira  was sent.  Plan: adalimumab  (HUMIRA , 2 SYRINGE,) 40 MG/0.4ML prefilled syringe  Other psoriasis -she has psoriasis patch behind her left pinna.  She is uses topical steroids as needed.  High risk medication use - Humira  40 mg sq injections every 14 days TB Gold July 19, 2023 -January 29, 2024 CMP creatinine 1.0, AST 16, ALT 13, WBC 5.3, hemoglobin 13.1, platelets 237, vitamin D 53.7.  Labs from June 06, 2024 CBC, CMP were normal. Plan: QuantiFERON-TB Gold Plus.  She was advised to get labs every 3 months.  Information reimmunization was placed in the AVS.  She was advised to hold Emert if she develops an infection resume after the infection resolves.  Primary osteoarthritis of both knees-she  had good range of motion without any discomfort.  Pedal edema-lower extremity edema was noted with bilateral feet more prominent on her right lower extremity.  Use of compression socks was discussed.  She was also advised to discuss this further with her PCP.  Age-related osteoporosis without current pathological fracture - Followed by Dr. Loreli.  Patient is currently not on treatment.  History of atrial fibrillation-followed by cardiology.  She remains on Eliquis .  History of stroke - Left-sided hemiparesis  Dyslipidemia  History of diabetes mellitus  History of coronary artery disease  History of hypercholesterolemia-she is on Zocor  and Zetia .  History of hypertension-blood pressure was normal at 131/75 today.  She is on Toprol  and hydralazine .  Orders: Orders Placed This Encounter  Procedures   QuantiFERON-TB Gold Plus   Meds ordered this encounter  Medications   adalimumab  (HUMIRA , 2 SYRINGE,) 40 MG/0.4ML prefilled syringe    Sig: Inject 0.4 mLs (40 mg total) into the skin every 14 (fourteen) days.    Dispense:  2 each    Refill:  2    Prescription Type::   Renewal     Follow-Up Instructions: Return in about 5 months (around 11/26/2024) for Psoriatic arthritis.   Maya Nash, MD  Note - This record has been created using Animal nutritionist.  Chart creation errors have been sought, but may not always  have been located. Such creation errors do not reflect on  the standard of medical care.

## 2024-06-18 ENCOUNTER — Encounter: Payer: Self-pay | Admitting: Podiatry

## 2024-06-18 ENCOUNTER — Ambulatory Visit: Payer: Medicare Other | Admitting: Podiatry

## 2024-06-18 DIAGNOSIS — M79674 Pain in right toe(s): Secondary | ICD-10-CM | POA: Diagnosis not present

## 2024-06-18 DIAGNOSIS — M79675 Pain in left toe(s): Secondary | ICD-10-CM

## 2024-06-18 DIAGNOSIS — E1151 Type 2 diabetes mellitus with diabetic peripheral angiopathy without gangrene: Secondary | ICD-10-CM

## 2024-06-18 DIAGNOSIS — B351 Tinea unguium: Secondary | ICD-10-CM | POA: Diagnosis not present

## 2024-06-18 DIAGNOSIS — E119 Type 2 diabetes mellitus without complications: Secondary | ICD-10-CM

## 2024-06-19 ENCOUNTER — Ambulatory Visit: Payer: Medicare Other | Admitting: Rheumatology

## 2024-06-22 DIAGNOSIS — H00019 Hordeolum externum unspecified eye, unspecified eyelid: Secondary | ICD-10-CM

## 2024-06-22 HISTORY — DX: Hordeolum externum unspecified eye, unspecified eyelid: H00.019

## 2024-06-22 NOTE — Progress Notes (Signed)
  Subjective:  Patient ID: Margaret Hobbs, female    DOB: 1951-01-23,  MRN: [id number]  Margaret Hobbs presents to clinic today for for annual diabetic foot examination, at risk foot care. Pt has h/o NIDDM with PAD, and painful thick toenails that are difficult to trim. Pain interferes with ambulation. Aggravating factors include wearing enclosed shoe gear. Pain is relieved with periodic professional debridement.   New problem(s): None.   PCP is Loreli Elsie JONETTA Mickey., MD.  Allergies  Allergen Reactions   Remicade [Infliximab] Other (See Comments)    Convulsion and felt cold    Amiodarone      Per pr report she was dizzy, weak and lethargic   Dapagliflozin     Other reaction(s): dysequilibrium, urinary frequency   Semaglutide Other (See Comments)    Caused fast heart rate and chest tightness   Sulfa Antibiotics Rash    Review of Systems: Negative except as noted in the HPI.  Objective: No changes noted in today's physical examination. There were no vitals filed for this visit. Margaret Hobbs is a pleasant 73 y.o. female in NAD. AAO x 3.  Vascular Examination: CFT <3 seconds b/l. DP/PT pulses faintly palpable b/l. Skin temperature gradient warm to warm b/l. No pain with calf compression. No ischemia or gangrene. No cyanosis or clubbing noted b/l. +1 pitting edema right lower extremity.   Neurological Examination: Sensation grossly intact b/l with 10 gram monofilament. Vibratory sensation intact b/l.   Dermatological Examination: Pedal skin warm and supple b/l.   No open wounds. No interdigital macerations.  Toenails 1-5 b/l thick, discolored, elongated with subungual debris and pain on dorsal palpation.    No hyperkeratotic nor porokeratotic lesions present on today's visit.  Musculoskeletal Examination: Muscle strength 5/5 to all LE muscle groups of left lower extremity. Muscle strength 3/5 to all LE muscle groups of right lower extremity.  Radiographs:  None  Assessment/Plan: 1. Pain due to onychomycosis of toenails of both feet   2. Type II diabetes mellitus with peripheral circulatory disorder (HCC)   3. Encounter for diabetic foot exam (HCC)     Diabetic foot examination performed today. All patient's and/or POA's questions/concerns addressed on today's visit. Toenails 1-5 debrided in length and girth without incident. Continue foot and shoe inspections daily. Monitor blood glucose per PCP/Endocrinologist's recommendations. Continue soft, supportive shoe gear daily. Report any pedal injuries to medical professional. Call office if there are any questions/concerns. -Patient/POA to call should there be question/concern in the interim.   Return in about 3 months (around 09/18/2024).  Delon LITTIE Merlin, DPM      [facility] LOCATION: [address], [place] [address]                   Office [phone]   [facility] LOCATION: [address] [place], [place] [address] Office [phone]

## 2024-06-26 ENCOUNTER — Encounter: Payer: Self-pay | Admitting: Rheumatology

## 2024-06-26 ENCOUNTER — Ambulatory Visit: Payer: Medicare Other | Attending: Rheumatology | Admitting: Rheumatology

## 2024-06-26 VITALS — BP 131/75 | HR 112 | Resp 16 | Ht 62.0 in | Wt 202.0 lb

## 2024-06-26 DIAGNOSIS — M170 Bilateral primary osteoarthritis of knee: Secondary | ICD-10-CM | POA: Diagnosis not present

## 2024-06-26 DIAGNOSIS — Z8679 Personal history of other diseases of the circulatory system: Secondary | ICD-10-CM

## 2024-06-26 DIAGNOSIS — E785 Hyperlipidemia, unspecified: Secondary | ICD-10-CM

## 2024-06-26 DIAGNOSIS — R600 Localized edema: Secondary | ICD-10-CM

## 2024-06-26 DIAGNOSIS — L4050 Arthropathic psoriasis, unspecified: Secondary | ICD-10-CM

## 2024-06-26 DIAGNOSIS — Z79899 Other long term (current) drug therapy: Secondary | ICD-10-CM | POA: Diagnosis not present

## 2024-06-26 DIAGNOSIS — L408 Other psoriasis: Secondary | ICD-10-CM | POA: Diagnosis not present

## 2024-06-26 DIAGNOSIS — Z8673 Personal history of transient ischemic attack (TIA), and cerebral infarction without residual deficits: Secondary | ICD-10-CM

## 2024-06-26 DIAGNOSIS — Z8639 Personal history of other endocrine, nutritional and metabolic disease: Secondary | ICD-10-CM

## 2024-06-26 DIAGNOSIS — M810 Age-related osteoporosis without current pathological fracture: Secondary | ICD-10-CM

## 2024-06-26 MED ORDER — HUMIRA (2 SYRINGE) 40 MG/0.4ML ~~LOC~~ PSKT: 40.0000 mg | PREFILLED_SYRINGE | SUBCUTANEOUS | 2 refills | Status: DC

## 2024-06-26 NOTE — Patient Instructions (Signed)
 Standing Labs We placed an order today for your standing lab work.   Please have your standing labs drawn in September and every 3 months  Please have your labs drawn 2 weeks prior to your appointment so that the provider can discuss your lab results at your appointment, if possible.  Please note that you may see your imaging and lab results in MyChart before we have reviewed them. We will contact you once all results are reviewed. Please allow our office up to 72 hours to thoroughly review all of the results before contacting the office for clarification of your results.  WALK-IN LAB HOURS  Monday through Thursday from 8:00 am -12:30 pm and 1:00 pm-4:30 pm and Friday from 8:00 am-12:00 pm.  Patients with office visits requiring labs will be seen before walk-in labs.  You may encounter longer than normal wait times. Please allow additional time. Wait times may be shorter on  Monday and Thursday afternoons.  We do not book appointments for walk-in labs. We appreciate your patience and understanding with our staff.   Labs are drawn by [facility]. Please bring your co-pay at the time of your lab draw.  You may receive a bill from [facility] for your lab work.  Please note if you are on Hydroxychloroquine and and an order has been placed for a Hydroxychloroquine level,  you will need to have it drawn 4 hours or more after your last dose.  If you wish to have your labs drawn at another location, please call the office 24 hours in advance so we can fax the orders.  The office is located at [address], [place], [place] [address]   If you have any questions regarding directions or hours of operation,  please call [phone].   As a reminder, please drink plenty of water prior to coming for your lab work. Thanks!   Vaccines You are taking a medication(s) that can suppress your immune system.  The following immunizations are recommended: Flu annually Covid-19  Td/Tdap (tetanus,  diphtheria, pertussis) every 10 years Pneumonia (Prevnar 15 then Pneumovax 23 at least 1 year apart.  Alternatively, can take Prevnar 20 without needing additional dose) Shingrix: 2 doses from 4 weeks to 6 months apart  Please check with your PCP to make sure you are up to date.   If you have signs or symptoms of an infection or start antibiotics: First, call your PCP for workup of your infection. Hold your medication through the infection, until you complete your antibiotics, and until symptoms resolve if you take the following: Injectable medication (Actemra, Benlysta, Cimzia, Cosentyx, Enbrel, Humira , Kevzara, Orencia, Remicade, Simponi, Stelara, Taltz, Tremfya) Methotrexate  Leflunomide (Arava) Mycophenolate (Cellcept) Earma, Olumiant, or Rinvoq   Please get an annual skin examination to screen for skin cancer while you are on Humira .  Please use sunscreen and sun protection.

## 2024-07-01 ENCOUNTER — Ambulatory Visit: Payer: Self-pay | Admitting: Rheumatology

## 2024-07-01 ENCOUNTER — Telehealth: Payer: Self-pay | Admitting: *Deleted

## 2024-07-01 DIAGNOSIS — L4050 Arthropathic psoriasis, unspecified: Secondary | ICD-10-CM

## 2024-07-01 LAB — QUANTIFERON-TB GOLD PLUS
Mitogen-NIL: 6.29 [IU]/mL
NIL: 0.02 [IU]/mL
QuantiFERON-TB Gold Plus: NEGATIVE
TB1-NIL: 0 [IU]/mL
TB2-NIL: 0.01 [IU]/mL

## 2024-07-01 MED ORDER — HUMIRA (2 SYRINGE) 40 MG/0.4ML ~~LOC~~ PSKT: 40.0000 mg | PREFILLED_SYRINGE | SUBCUTANEOUS | 2 refills | Status: DC

## 2024-07-01 NOTE — Telephone Encounter (Signed)
 Patient contacted the office requesting her Humira  and seeing if it had been refilled. Prescription was sent to local pharmacy by mistake. Resent prescription to the correct pharmacy.

## 2024-07-01 NOTE — Progress Notes (Signed)
 TB Gold is negative.

## 2024-09-02 LAB — LAB REPORT - SCANNED: EGFR: 54.5

## 2024-09-05 ENCOUNTER — Other Ambulatory Visit: Payer: Self-pay | Admitting: Rheumatology

## 2024-09-05 DIAGNOSIS — L4050 Arthropathic psoriasis, unspecified: Secondary | ICD-10-CM

## 2024-09-09 ENCOUNTER — Other Ambulatory Visit: Payer: Self-pay

## 2024-09-09 ENCOUNTER — Telehealth: Payer: Self-pay

## 2024-09-09 DIAGNOSIS — L4050 Arthropathic psoriasis, unspecified: Secondary | ICD-10-CM

## 2024-09-09 MED ORDER — HUMIRA (2 SYRINGE) 40 MG/0.4ML ~~LOC~~ PSKT: 40.0000 mg | PREFILLED_SYRINGE | SUBCUTANEOUS | 2 refills | Status: DC

## 2024-09-09 NOTE — Telephone Encounter (Signed)
 Labs received from: [facility]  Drawn on: 09/02/2024  Reviewed by: Dr. Dolphus  Labs drawn: CMP, CBC, Vitamin D  Results:  Glucose 230 Alk phos 34 BUN 27  Vitamin D 39.4   Patient is on Humira  40mg  subcutaneous every 14 days.  Copy of labs sent to the scan center.

## 2024-09-09 NOTE — Telephone Encounter (Signed)
 Refill request for humira  received via fax from [facility].   Last Fill: 07/01/2024  Labs: 06/03/2024 glucose 175, MPV 10.8, mono% 13.7  TB Gold: 06/26/2024 negative    Next Visit: 12/03/2024  Last Visit: 06/26/2024  DX: Psoriatic arthritis   Current Dose per office note on 06/26/2024: Humira  40 mg sq injections every 14 days.   Advised patient she is due to update labs and patient states she updated labs last week at her PCP's office. I called Dr. Orlando office and left message to request lab results.   Okay to refill Humira ?

## 2024-10-08 ENCOUNTER — Ambulatory Visit: Admitting: Podiatry

## 2024-10-08 ENCOUNTER — Encounter: Payer: Self-pay | Admitting: Podiatry

## 2024-10-08 DIAGNOSIS — M79674 Pain in right toe(s): Secondary | ICD-10-CM

## 2024-10-08 DIAGNOSIS — B351 Tinea unguium: Secondary | ICD-10-CM

## 2024-10-08 DIAGNOSIS — M79675 Pain in left toe(s): Secondary | ICD-10-CM | POA: Diagnosis not present

## 2024-10-08 DIAGNOSIS — E1151 Type 2 diabetes mellitus with diabetic peripheral angiopathy without gangrene: Secondary | ICD-10-CM | POA: Diagnosis not present

## 2024-10-12 NOTE — Progress Notes (Signed)
  Subjective:  Patient ID: Margaret Hobbs, female    DOB: 11/28/1951,  MRN: [id number]  Margaret Hobbs presents to clinic today for at risk foot care. Pt has h/o NIDDM with PAD and painful mycotic toenails x 10 which interfere with daily activities. Pain is relieved with periodic professional debridement.  Chief Complaint  Patient presents with   Diabetes    DFC NIDDM A1C 7.2. Upcoming appt. with PCP 10/2024.   New problem(s): None.   PCP is Loreli Elsie JONETTA Mickey., MD.  Allergies  Allergen Reactions   Remicade [Infliximab] Other (See Comments)    Convulsion and felt cold    Amiodarone      Per pr report she was dizzy, weak and lethargic   Dapagliflozin     Other reaction(s): dysequilibrium, urinary frequency   Semaglutide Other (See Comments)    Caused fast heart rate and chest tightness   Sulfa Antibiotics Rash    Review of Systems: Negative except as noted in the HPI.  Objective: No changes noted in today's physical examination. There were no vitals filed for this visit. Margaret Hobbs is a pleasant 73 y.o. female in NAD. AAO x 3.  Vascular Examination: CFT <3 seconds b/l. DP/PT pulses faintly palpable b/l. Digital hair absent.  Skin temperature gradient warm to warm b/l. No pain with calf compression. No ischemia or gangrene. No cyanosis or clubbing noted b/l. +1 pitting edema right lower extremity.   Neurological Examination: Sensation grossly intact b/l with 10 gram monofilament. Vibratory sensation intact b/l.   Dermatological Examination: Pedal skin warm and supple b/l.   No open wounds. No interdigital macerations.  Toenails 1-5 b/l thick, discolored, elongated with subungual debris and pain on dorsal palpation.    No corns, calluses, nor porokeratotic lesions.  Musculoskeletal Examination: Muscle strength 5/5 to all LE muscle groups of left lower extremity. Muscle strength 3/5 to all LE muscle groups of right lower extremity.  Radiographs:  None  Assessment/Plan: 1. Pain due to onychomycosis of toenails of both feet   2. Type II diabetes mellitus with peripheral circulatory disorder [facility])   -Patient was evaluated today. All questions/concerns addressed on today's visit. -Continue foot and shoe inspections daily. Monitor blood glucose per PCP/Endocrinologist's recommendations. -Patient to continue soft, supportive shoe gear daily. -Mycotic toenails 1-5 bilaterally were debrided in length and girth with sterile nail nippers and dremel. Pinpoint bleeding of left great toe addressed with Lumicain Hemostatic Solution, cleansed with alcohol. Triple antibiotic ointment applied. No further treatment required by patient/caregiver. -Patient/POA to call should there be question/concern in the interim.   Return in about 3 months (around 01/08/2025).  Delon LITTIE Merlin, DPM      [facility] LOCATION: [address], [place] [address]                   Office [phone]   [facility] LOCATION: [address] [place], [place] [address] Office [phone]

## 2024-11-13 NOTE — Progress Notes (Signed)
 Office Visit Note  Patient: Margaret Hobbs             Date of Birth: [birth year]           MRN: [id number]             PCP: Loreli Elsie JONETTA Mickey., MD Referring: Loreli Elsie JONETTA Mickey., MD Visit Date: 11/27/2024 Occupation: Data Unavailable  Subjective:  Medication monitoring   History of Present Illness: Margaret Hobbs is a 73 y.o. female with history of psoriatic arthritis. Patient remains on humira  40 mg sq injections every 14 days.  She is tolerating Humira  without any side effects or injection site reactions.  She has not had any recent gaps in therapy.  Patient is expecting a shipment of Humira  to arrive tomorrow.  Patient continues to have morning stiffness lasting about 20 minutes daily.  She denies any joint swelling at this time.  She experiences intermittent arthralgias and joint stiffness.  She denies any recent or recurrent infections.  Patient states that she continues to have some psoriasis around the left ear but does not need any topical agents at this time.    Activities of Daily Living:  Patient reports morning stiffness for 20 minutes   Patient Denies nocturnal pain.  Difficulty dressing/grooming: Denies Difficulty climbing stairs: Reports Difficulty getting out of chair: Reports Difficulty using hands for taps, buttons, cutlery, and/or writing: Denies  Review of Systems  Constitutional:  Negative for fatigue.  HENT:  Negative for mouth sores and mouth dryness.   Eyes:  Negative for dryness.  Respiratory:  Negative for shortness of breath.   Cardiovascular:  Negative for chest pain and palpitations.  Gastrointestinal:  Positive for diarrhea. Negative for blood in stool and constipation.  Endocrine: Negative for increased urination.  Genitourinary:  Negative for involuntary urination.  Musculoskeletal:  Positive for joint pain, gait problem, joint pain, morning stiffness and muscle tenderness. Negative for joint swelling, myalgias, muscle weakness and  myalgias.  Skin:  Positive for color change and hair loss. Negative for rash and sensitivity to sunlight.  Allergic/Immunologic: Negative for susceptible to infections.  Neurological:  Negative for dizziness and headaches.  Hematological:  Negative for swollen glands.  Psychiatric/Behavioral:  Negative for depressed mood and sleep disturbance. The patient is not nervous/anxious.     PMFS History:  Patient Active Problem List   Diagnosis Date Noted   PVC's (premature ventricular contractions) 06/15/2023   Paroxysmal atrial fibrillation (HCC) 06/14/2023   Posterior vitreous detachment of both eyes 05/04/2022   Other specified counseling 03/10/2022   Epistaxis 11/29/2021   Hypercoagulable state 11/29/2021   Morbid obesity (HCC) 11/29/2021   Pseudophakia of both eyes 04/28/2021   Claudication in peripheral vascular disease 03/31/2021   Chronic kidney disease due to hypertension 05/18/2020   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) 05/06/2020   Round hole of right eye 05/06/2020   Severe nonproliferative diabetic retinopathy of left eye (HCC) 05/06/2020   Retinal hemorrhage of left eye 05/06/2020   Peripheral arterial disease 10/17/2019   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   Carotid artery disease 01/24/2017   Primary osteoarthritis of both knees 11/29/2016   Other psoriasis 11/28/2016   Psoriatic arthritis (HCC) 11/28/2016   High risk medication use 11/28/2016   Chronic kidney disease, stage 3a (HCC) 11/25/2012   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/29/2009   Carotid artery occlusion 10/19/2009   Age-related osteoporosis without current pathological fracture 10/19/2009  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   Type 2 diabetes mellitus (HCC) 03/30/2009   Hyperlipidemia 03/30/2009   Dyslipidemia 03/30/2009   Essential hypertension 03/30/2009   History of MI (myocardial infarction)  03/30/2009   Coronary artery disease involving native coronary artery of native heart with angina pectoris 03/30/2009   Cerebral artery occlusion with cerebral infarction (HCC) 03/30/2009   URINARY TRACT INFECTION 03/30/2009   Arthropathy 03/30/2009   HYPERPARATHYROIDISM, HX OF 03/30/2009    Past Medical History:  Diagnosis Date   Aphasia    Arthritis    CAD (coronary artery disease)    Cerebrovascular disease    CVA (cerebral vascular accident) (HCC)    Depressed    Diabetes mellitus    DM2 (diabetes mellitus, type 2) (HCC)    HLD (hyperlipidemia)    HTN (hypertension)    Hypercholesteremia    Hyperparathyroidism    MI (myocardial infarction) (HCC)    Osteoporosis    Stye 06/22/2024   right eye   TIA (transient ischemic attack)    UTI (urinary tract infection)     Family History  Problem Relation Age of Onset   Heart disease Mother        pacemaker   Hypertension Mother    Hyperlipidemia Mother    Diabetes Brother        pre-diabetic    Hypertension Son    Sleep apnea Son    Diabetes Other    Hypertension Other    Alcohol abuse Other    Coronary artery disease Other    Past Surgical History:  Procedure Laterality Date   ABDOMINAL AORTOGRAM W/LOWER EXTREMITY Bilateral 03/31/2021   Procedure: ABDOMINAL AORTOGRAM W/LOWER EXTREMITY;  Surgeon: Court Dorn PARAS, MD;  Location: [facility];  Service: Cardiovascular;  Laterality: Bilateral;   ABDOMINAL HYSTERECTOMY     CATARACT EXTRACTION W/PHACO Bilateral 2017   Dr. Lelon   hysterectomy - unknown type     Social History   Tobacco Use   Smoking status: Never    Passive exposure: Past   Smokeless tobacco: Never  Vaping Use   Vaping status: Never Used  Substance Use Topics   Alcohol use: No   Drug use: Never   Social History   Social History Narrative   Not on file     Immunization History  Administered Date(s) Administered   PFIZER(Purple Top)SARS-COV-2 Vaccination 02/16/2020, 03/08/2020,  11/05/2020, 10/31/2021   Zoster Recombinant(Shingrix) 08/10/2020     Objective: Vital Signs: BP 102/73   Pulse 94   Temp (!) 96.9 F (36.1 C)   Resp 14   Ht 5' 2 (1.575 m)   Wt 200 lb 3.2 oz (90.8 kg)   BMI 36.62 kg/m    Physical Exam Vitals and nursing note reviewed.  Constitutional:      Appearance: She is well-developed.  HENT:     Head: Normocephalic and atraumatic.  Eyes:     Conjunctiva/sclera: Conjunctivae normal.  Cardiovascular:     Rate and Rhythm: Normal rate and regular rhythm.     Heart sounds: Normal heart sounds.  Pulmonary:     Effort: Pulmonary effort is normal.     Breath sounds: Normal breath sounds.  Abdominal:     General: Bowel sounds are normal.     Palpations: Abdomen is soft.  Musculoskeletal:     Cervical back: Normal range of motion.  Lymphadenopathy:     Cervical: No cervical adenopathy.  Skin:    General: Skin is warm and dry.  Capillary Refill: Capillary refill takes less than 2 seconds.  Neurological:     Mental Status: She is alert and oriented to person, place, and time.  Psychiatric:        Behavior: Behavior normal.      Musculoskeletal Exam: Patient remained seated during examination today.  C-spine has limited range of motion without rotation.  Thoracic kyphosis noted.  No midline spinal tenderness.  No SI joint tenderness.  Shoulder joints have slightly limited internal rotation.  Elbow joints and wrist joints have good range of motion.  No tenderness or synovitis of MCP joints.  PIP and DIP thickening consistent with osteoarthritis of both hands.  CMC joint prominence and thickening bilaterally.  Tenderness of the left CMC joint.  Knee joints have good range of motion with no warmth or effusion.  Pedal edema noted bilateral lower extremities.  Some tenderness along the Achilles tendon of the left foot.   CDAI Exam: CDAI Score: -- Patient Global: --; Provider Global: -- Swollen: --; Tender: -- Joint Exam 11/27/2024   No  joint exam has been documented for this visit   There is currently no information documented on the homunculus. Go to the Rheumatology activity and complete the homunculus joint exam.  Investigation: No additional findings.  Imaging: No results found.  Recent Labs: Lab Results  Component Value Date   WBC 5.9 07/19/2023   HGB 12.6 07/19/2023   PLT 229 07/19/2023   NA 141 07/19/2023   K 3.7 07/19/2023   CL 101 07/19/2023   CO2 30 07/19/2023   GLUCOSE 255 (H) 07/19/2023   BUN 37 (H) 07/19/2023   CREATININE 0.98 07/19/2023   BILITOT 0.4 07/19/2023   ALKPHOS 37 (L) 09/17/2021   AST 14 07/19/2023   ALT 16 07/19/2023   PROT 6.8 07/19/2023   ALBUMIN 3.4 (L) 09/17/2021   CALCIUM  10.0 07/19/2023   GFRAA 69 07/01/2019   QFTBGOLDPLUS NEGATIVE 06/26/2024    Speciality Comments: No specialty comments available.  Procedures:  No procedures performed Allergies: Remicade [infliximab], Amiodarone , Dapagliflozin, Semaglutide, and Sulfa antibiotics   Assessment / Plan:     Visit Diagnoses: Psoriatic arthritis (HCC): She has no synovitis or dactylitis on examination today.  She experiences intermittent arthralgias and joint stiffness due to underlying arthritis but has no active inflammation on exam.  Over the past month she has noted some increase soreness of the left Achilles tendon but no active plantar fasciitis.  No SI joint tenderness upon palpation.  Overall the patient has been clinically doing well on Humira  40 mg sq injections once every 14 days.  She is tolerating Humira  without any side effects or injection site reactions.  No medication changes will be made at this time.  She was advised to notify us  if she develops any signs or symptoms of a flare.  She will follow-up in the office in 5 months or sooner if needed.  Psoriasis: Behind left ear.  Declined topical agent.   High risk medication use - Humira  40 mg sq injections every 14 days.  CBC and CMP updated on 09/09/24.  Patient  will be having updated lab work ordered by her PCP and will have results forwarded to our office to review. TB gold negative on 06/26/24.  No recent or recurrent infections. Discussed the importance of holding humira  if she develops signs or symptoms of an infection and to resume once the infection has completely cleared.    Primary osteoarthritis of both knees: She has some stiffness and discomfort  with range of motion of both knee joints but no warmth or effusion was noted on examination today.  Age-related osteoporosis without current pathological fracture - Followed by Dr. Loreli.  Not currently on treatment.  She is taking vitamin D 1000 units daily.  Other medical conditions are listed as follows:  History of atrial fibrillation  History of stroke  Dyslipidemia  History of diabetes mellitus  History of coronary artery disease  History of hypercholesterolemia  History of hypertension: Blood pressure was 102/73 today in the office.  Pedal edema  Cerebral artery occlusion with cerebral infarction (HCC)  Orders: No orders of the defined types were placed in this encounter.  No orders of the defined types were placed in this encounter.   Follow-Up Instructions: Return in about 5 months (around 04/27/2025) for Psoriatic arthritis.   Waddell CHRISTELLA Craze, PA-C  Note - This record has been created using Dragon software.  Chart creation errors have been sought, but may not always  have been located. Such creation errors do not reflect on  the standard of medical care.

## 2024-11-19 ENCOUNTER — Other Ambulatory Visit: Payer: Self-pay

## 2024-11-19 DIAGNOSIS — L4050 Arthropathic psoriasis, unspecified: Secondary | ICD-10-CM

## 2024-11-19 NOTE — Telephone Encounter (Signed)
 Patient contacted the office stating she needs a refill of Humira  sent to [facility]. Patient states she needs the prescription sent out by Monday to try to get the medication on time. Patient requests a call back once she gets a response about her medication.   Last Fill: 09/09/2024  Labs: 09/02/2024  Glucose 230 Alk Phos 34 BUN 27  TB Gold: 06/26/2024 TB Gold is negative.  Next Visit: 11/27/2024  Last Visit: 06/26/2024  DX: Psoriatic arthritis (HCC)   Current Dose per office note 06/26/2024: Humira  40 mg sq injections every 14 days   Okay to refill Humira ?

## 2024-11-22 MED ORDER — HUMIRA (2 SYRINGE) 40 MG/0.4ML ~~LOC~~ PSKT: 40.0000 mg | PREFILLED_SYRINGE | SUBCUTANEOUS | 0 refills | Status: DC

## 2024-11-27 ENCOUNTER — Ambulatory Visit: Attending: Physician Assistant | Admitting: Physician Assistant

## 2024-11-27 ENCOUNTER — Encounter: Payer: Self-pay | Admitting: Physician Assistant

## 2024-11-27 VITALS — BP 102/73 | HR 94 | Temp 96.9°F | Resp 14 | Ht 62.0 in | Wt 200.2 lb

## 2024-11-27 DIAGNOSIS — M170 Bilateral primary osteoarthritis of knee: Secondary | ICD-10-CM | POA: Diagnosis not present

## 2024-11-27 DIAGNOSIS — I6350 Cerebral infarction due to unspecified occlusion or stenosis of unspecified cerebral artery: Secondary | ICD-10-CM

## 2024-11-27 DIAGNOSIS — Z8679 Personal history of other diseases of the circulatory system: Secondary | ICD-10-CM

## 2024-11-27 DIAGNOSIS — L4050 Arthropathic psoriasis, unspecified: Secondary | ICD-10-CM | POA: Diagnosis not present

## 2024-11-27 DIAGNOSIS — Z8673 Personal history of transient ischemic attack (TIA), and cerebral infarction without residual deficits: Secondary | ICD-10-CM

## 2024-11-27 DIAGNOSIS — Z79899 Other long term (current) drug therapy: Secondary | ICD-10-CM

## 2024-11-27 DIAGNOSIS — M810 Age-related osteoporosis without current pathological fracture: Secondary | ICD-10-CM

## 2024-11-27 DIAGNOSIS — Z8639 Personal history of other endocrine, nutritional and metabolic disease: Secondary | ICD-10-CM

## 2024-11-27 DIAGNOSIS — L409 Psoriasis, unspecified: Secondary | ICD-10-CM

## 2024-11-27 DIAGNOSIS — E785 Hyperlipidemia, unspecified: Secondary | ICD-10-CM

## 2024-11-27 DIAGNOSIS — R600 Localized edema: Secondary | ICD-10-CM

## 2024-12-03 ENCOUNTER — Ambulatory Visit: Admitting: Physician Assistant

## 2024-12-12 ENCOUNTER — Other Ambulatory Visit: Payer: Self-pay | Admitting: Physician Assistant

## 2024-12-12 DIAGNOSIS — L4050 Arthropathic psoriasis, unspecified: Secondary | ICD-10-CM

## 2024-12-12 NOTE — Telephone Encounter (Signed)
 Last Fill: 11/22/2024 (30 day supply)  Labs: 09/02/2024  Glucose 230 Alk Phos 34 BUN 27  TB Gold: 06/26/2024 TB Gold is negative    Next Visit: 05/07/2025  Last Visit: 11/27/2024  IK:Ednmpjupr arthritis   Current Dose per office note 11/27/2024: Humira  40 mg sq injections every 14 days   Left message to advise patient she is due to update labs.   Okay to refill Humira ?

## 2025-01-01 ENCOUNTER — Other Ambulatory Visit: Payer: Self-pay | Admitting: Physician Assistant

## 2025-01-01 DIAGNOSIS — L4050 Arthropathic psoriasis, unspecified: Secondary | ICD-10-CM

## 2025-01-01 NOTE — Telephone Encounter (Signed)
 Last Fill: 12/12/2024  Labs: 09/02/2024 Glucose 230 Alk Phos 34 BUN 27  TB Gold: 06/26/2024 TB Gold is negative.   Next Visit: 05/07/2024  Last Visit: 11/27/2024  IK:Ednmpjupr arthritis (HCC)   Current Dose per office note 11/27/2024: Humira  40 mg sq injections every 14 days.   Attempted to contact the patient and left a message advising labs are due and our lab hours. Advised the patient to call the office back and let us  know if she had labs with her PCP.   Okay to refill Humira ?

## 2025-01-14 ENCOUNTER — Ambulatory Visit: Admitting: Podiatry

## 2025-01-19 ENCOUNTER — Telehealth: Payer: Self-pay | Admitting: Pharmacist

## 2025-01-19 DIAGNOSIS — Z79899 Other long term (current) drug therapy: Secondary | ICD-10-CM

## 2025-01-19 NOTE — Telephone Encounter (Signed)
 Patient's insurance no longer covers Humira . Covers unbranded Yuflyma  or unbranded Cyltezo  Submitted a Prior Authorization request to [employer] for UNBRANDED YUFLYMA  (ADALIMUMAB -AATY) via CoverMyMeds. Will update once we receive a response.  Key: [id number]

## 2025-01-21 ENCOUNTER — Other Ambulatory Visit (HOSPITAL_COMMUNITY): Payer: Self-pay

## 2025-01-21 ENCOUNTER — Other Ambulatory Visit: Payer: Self-pay | Admitting: Physician Assistant

## 2025-01-21 DIAGNOSIS — L4050 Arthropathic psoriasis, unspecified: Secondary | ICD-10-CM

## 2025-01-21 NOTE — Telephone Encounter (Signed)
 Attempted to contact the patient and left message to advise patient she needs updated labs. Patient will need labs prior to sending in prescription.

## 2025-01-21 NOTE — Telephone Encounter (Signed)
 Received notification from [facility] regarding a prior authorization for Adalimumab -aaty. Authorization has been APPROVED from 12/25/2024 to 12/24/2025. Approval letter sent to scan center.  Per test claim, copay for 28 days supply is $0.00  Patient can continue to fill through [facility]: [phone]   Authorization # [id number]

## 2025-01-21 NOTE — Telephone Encounter (Signed)
 Please pend refill if timing is appropriate.

## 2025-01-23 NOTE — Telephone Encounter (Signed)
 Spoke with patient and she states she had her labs updated with her PCP in December. Patient states she will contact them to have them fax the results to our office. Patient advised once we receive the results we will send in her prescription. Patient expressed understanding.

## 2025-01-27 ENCOUNTER — Telehealth: Payer: Self-pay

## 2025-01-27 NOTE — Telephone Encounter (Signed)
 Labs received from: PCP  Drawn on: 10/27/2024  Reviewed by: Waddell Craze PA-C  Labs drawn: CMP, CBC, Lipid Panel, TSH, Vitamin D, A1c, and Magnesium   Results: Glucose 163 Alk Pho 33 BUN 24 Triglycerides 275 LDL/HDL Ratio 0.8 A1c 7.3

## 2025-01-29 ENCOUNTER — Telehealth: Payer: Self-pay

## 2025-01-29 NOTE — Telephone Encounter (Signed)
 Labs received from: [facility]  Drawn on: 01/27/2025  Reviewed by: Waddell Craze, PA-C  Labs drawn: CBC, CMP, Vitamin D  Results: Glucose 120 BUN 31 Alk Phos 30

## 2025-02-04 ENCOUNTER — Ambulatory Visit: Admitting: Podiatry

## 2025-04-15 ENCOUNTER — Ambulatory Visit: Admitting: Podiatry

## 2025-05-07 ENCOUNTER — Ambulatory Visit: Admitting: Rheumatology

## 2025-07-08 ENCOUNTER — Ambulatory Visit: Admitting: Podiatry
